# Patient Record
Sex: Male | Born: 1950 | Race: White | Hispanic: No | Smoking: Former smoker
Health system: Southern US, Community
[De-identification: ages and names within clinical notes are randomized; demographics above are authoritative.]

## PROBLEM LIST (undated history)

## (undated) DIAGNOSIS — E7800 Pure hypercholesterolemia, unspecified: Secondary | ICD-10-CM

## (undated) DIAGNOSIS — R51 Headache: Secondary | ICD-10-CM

## (undated) DIAGNOSIS — E785 Hyperlipidemia, unspecified: Secondary | ICD-10-CM

## (undated) DIAGNOSIS — J302 Other seasonal allergic rhinitis: Secondary | ICD-10-CM

## (undated) DIAGNOSIS — I4819 Other persistent atrial fibrillation: Secondary | ICD-10-CM

## (undated) HISTORY — PX: FRACTURE SURGERY: SHX138

## (undated) HISTORY — PX: COLONOSCOPY: SHX174

## (undated) HISTORY — DX: Headache: R51

## (undated) HISTORY — DX: Other persistent atrial fibrillation: I48.19

## (undated) HISTORY — DX: Hyperlipidemia, unspecified: E78.5

## (undated) HISTORY — DX: Other seasonal allergic rhinitis: J30.2

## (undated) HISTORY — PX: POLYPECTOMY: SHX149

---

## 2000-09-16 ENCOUNTER — Emergency Department (HOSPITAL_COMMUNITY): Admission: EM | Admit: 2000-09-16 | Discharge: 2000-09-16 | Payer: Self-pay | Admitting: Emergency Medicine

## 2002-05-14 ENCOUNTER — Encounter: Payer: Self-pay | Admitting: Internal Medicine

## 2003-09-22 ENCOUNTER — Encounter: Payer: Self-pay | Admitting: Internal Medicine

## 2006-03-03 ENCOUNTER — Ambulatory Visit: Payer: Self-pay | Admitting: Internal Medicine

## 2007-01-08 ENCOUNTER — Ambulatory Visit: Payer: Self-pay | Admitting: Internal Medicine

## 2007-01-12 LAB — CONVERTED CEMR LAB
ALT: 27 units/L (ref 0–53)
AST: 23 units/L (ref 0–37)
Albumin: 4.3 g/dL (ref 3.5–5.2)
Alkaline Phosphatase: 39 units/L (ref 39–117)
BUN: 15 mg/dL (ref 6–23)
Basophils Absolute: 0 10*3/uL (ref 0.0–0.1)
Basophils Relative: 0.3 % (ref 0.0–1.0)
Bilirubin, Direct: 0.1 mg/dL (ref 0.0–0.3)
CO2: 32 meq/L (ref 19–32)
Calcium: 9.6 mg/dL (ref 8.4–10.5)
Chloride: 106 meq/L (ref 96–112)
Cholesterol: 243 mg/dL (ref 0–200)
Creatinine, Ser: 1 mg/dL (ref 0.4–1.5)
Direct LDL: 167.4 mg/dL
Eosinophils Absolute: 0.2 10*3/uL (ref 0.0–0.6)
Eosinophils Relative: 3.5 % (ref 0.0–5.0)
GFR calc Af Amer: 99 mL/min
GFR calc non Af Amer: 82 mL/min
Glucose, Bld: 90 mg/dL (ref 70–99)
HCT: 43.1 % (ref 39.0–52.0)
HDL: 37.3 mg/dL — ABNORMAL LOW (ref >39.0)
Hemoglobin: 14.6 g/dL (ref 13.0–17.0)
Lymphocytes Relative: 30.5 % (ref 12.0–46.0)
MCHC: 33.9 g/dL (ref 30.0–36.0)
MCV: 85.6 fL (ref 78.0–100.0)
Monocytes Absolute: 0.5 10*3/uL (ref 0.2–0.7)
Monocytes Relative: 9.3 % (ref 3.0–11.0)
Neutro Abs: 2.8 10*3/uL (ref 1.4–7.7)
Neutrophils Relative %: 56.4 % (ref 43.0–77.0)
PSA: 0.76 ng/mL (ref 0.10–4.00)
Platelets: 239 10*3/uL (ref 150–400)
Potassium: 4.5 meq/L (ref 3.5–5.1)
RBC: 5.04 M/uL (ref 4.22–5.81)
RDW: 12.4 % (ref 11.5–14.6)
Sodium: 143 meq/L (ref 135–145)
TSH: 1.76 microintl units/mL (ref 0.35–5.50)
Total Bilirubin: 0.9 mg/dL (ref 0.3–1.2)
Total CHOL/HDL Ratio: 6.5
Total Protein: 6.5 g/dL (ref 6.0–8.3)
Triglycerides: 104 mg/dL (ref 0–149)
VLDL: 21 mg/dL (ref 0–40)
WBC: 5.1 10*3/uL (ref 4.5–10.5)

## 2007-01-23 ENCOUNTER — Ambulatory Visit: Payer: Self-pay | Admitting: Internal Medicine

## 2007-01-24 LAB — CONVERTED CEMR LAB
Bilirubin Urine: NEGATIVE
Glucose, Urine, Semiquant: NEGATIVE
Ketones, urine, test strip: NEGATIVE
Nitrite: NEGATIVE
Specific Gravity, Urine: 1.025
Urobilinogen, UA: 0.2
WBC Urine, dipstick: NEGATIVE
pH: 5.5

## 2007-05-11 ENCOUNTER — Ambulatory Visit: Payer: Self-pay | Admitting: Internal Medicine

## 2007-05-11 DIAGNOSIS — E785 Hyperlipidemia, unspecified: Secondary | ICD-10-CM

## 2007-05-14 LAB — CONVERTED CEMR LAB
Direct LDL: 144 mg/dL
HDL: 34.9 mg/dL — ABNORMAL LOW (ref >39.0)
Total CHOL/HDL Ratio: 5.8

## 2007-06-07 ENCOUNTER — Ambulatory Visit: Payer: Self-pay | Admitting: Internal Medicine

## 2007-06-07 LAB — CONVERTED CEMR LAB: Inflenza A Ag: NEGATIVE

## 2007-06-11 LAB — CONVERTED CEMR LAB
ALT: 26 units/L (ref 0–53)
Amylase: 50 units/L (ref 27–131)
Basophils Absolute: 0 10*3/uL (ref 0.0–0.1)
Bilirubin, Direct: 0.1 mg/dL (ref 0.0–0.3)
Eosinophils Absolute: 0.1 10*3/uL (ref 0.0–0.7)
HCT: 42.5 % (ref 39.0–52.0)
Hemoglobin: 14.2 g/dL (ref 13.0–17.0)
MCHC: 33.5 g/dL (ref 30.0–36.0)
MCV: 84.3 fL (ref 78.0–100.0)
Monocytes Absolute: 0.5 10*3/uL (ref 0.1–1.0)
Monocytes Relative: 7.1 % (ref 3.0–12.0)
Neutro Abs: 5.7 10*3/uL (ref 1.4–7.7)
Platelets: 217 10*3/uL (ref 150–400)
RDW: 12.1 % (ref 11.5–14.6)
Total Bilirubin: 0.7 mg/dL (ref 0.3–1.2)

## 2007-09-06 ENCOUNTER — Telehealth: Payer: Self-pay | Admitting: Internal Medicine

## 2007-11-01 ENCOUNTER — Ambulatory Visit: Payer: Self-pay | Admitting: Internal Medicine

## 2007-11-01 LAB — CONVERTED CEMR LAB
Basophils Absolute: 0 10*3/uL (ref 0.0–0.1)
Basophils Relative: 0.4 % (ref 0.0–3.0)
Bilirubin Urine: NEGATIVE
Bilirubin, Direct: 0.1 mg/dL (ref 0.0–0.3)
Blood in Urine, dipstick: NEGATIVE
Calcium: 9.2 mg/dL (ref 8.4–10.5)
Cholesterol: 203 mg/dL (ref 0–200)
Creatinine, Ser: 0.9 mg/dL (ref 0.4–1.5)
Direct LDL: 154.3 mg/dL
Eosinophils Absolute: 0.2 10*3/uL (ref 0.0–0.7)
GFR calc non Af Amer: 92 mL/min
Glucose, Urine, Semiquant: NEGATIVE
HDL: 34.3 mg/dL — ABNORMAL LOW (ref >39.0)
Hemoglobin: 14.2 g/dL (ref 13.0–17.0)
MCHC: 33.9 g/dL (ref 30.0–36.0)
MCV: 85.5 fL (ref 78.0–100.0)
Neutro Abs: 3.2 10*3/uL (ref 1.4–7.7)
Neutrophils Relative %: 61.4 % (ref 43.0–77.0)
PSA: 0.63 ng/mL (ref 0.10–4.00)
Protein, U semiquant: NEGATIVE
RBC: 4.9 M/uL (ref 4.22–5.81)
RDW: 12.6 % (ref 11.5–14.6)
Sodium: 143 meq/L (ref 135–145)
Specific Gravity, Urine: 1.025
TSH: 1 microintl units/mL (ref 0.35–5.50)
Total Bilirubin: 0.9 mg/dL (ref 0.3–1.2)
VLDL: 14 mg/dL (ref 0–40)
WBC Urine, dipstick: NEGATIVE
pH: 5.5

## 2007-11-06 ENCOUNTER — Encounter: Payer: Self-pay | Admitting: Internal Medicine

## 2007-11-06 ENCOUNTER — Ambulatory Visit: Payer: Self-pay | Admitting: Internal Medicine

## 2007-11-09 ENCOUNTER — Ambulatory Visit: Payer: Self-pay | Admitting: Internal Medicine

## 2007-11-12 LAB — CONVERTED CEMR LAB: Fecal Occult Bld: POSITIVE — AB

## 2007-11-20 ENCOUNTER — Ambulatory Visit: Payer: Self-pay | Admitting: Gastroenterology

## 2007-11-21 ENCOUNTER — Encounter: Payer: Self-pay | Admitting: Internal Medicine

## 2007-11-24 LAB — HM COLONOSCOPY

## 2007-12-04 ENCOUNTER — Ambulatory Visit: Payer: Self-pay | Admitting: Gastroenterology

## 2007-12-04 ENCOUNTER — Encounter: Payer: Self-pay | Admitting: Gastroenterology

## 2007-12-06 ENCOUNTER — Encounter: Payer: Self-pay | Admitting: Gastroenterology

## 2007-12-17 ENCOUNTER — Encounter: Payer: Self-pay | Admitting: Internal Medicine

## 2008-05-28 ENCOUNTER — Ambulatory Visit: Payer: Self-pay | Admitting: Internal Medicine

## 2008-05-28 LAB — CONVERTED CEMR LAB
Nitrite: NEGATIVE
Specific Gravity, Urine: 1.025
WBC Urine, dipstick: NEGATIVE

## 2008-05-29 ENCOUNTER — Encounter: Payer: Self-pay | Admitting: Internal Medicine

## 2008-06-04 ENCOUNTER — Ambulatory Visit: Payer: Self-pay | Admitting: Internal Medicine

## 2008-10-16 ENCOUNTER — Telehealth: Payer: Self-pay | Admitting: Internal Medicine

## 2008-10-28 ENCOUNTER — Ambulatory Visit: Payer: Self-pay | Admitting: Internal Medicine

## 2008-11-26 ENCOUNTER — Ambulatory Visit: Payer: Self-pay | Admitting: Pulmonary Disease

## 2009-01-08 ENCOUNTER — Telehealth: Payer: Self-pay | Admitting: Internal Medicine

## 2009-03-23 ENCOUNTER — Ambulatory Visit: Payer: Self-pay | Admitting: Internal Medicine

## 2009-03-23 DIAGNOSIS — R51 Headache: Secondary | ICD-10-CM

## 2009-03-23 HISTORY — DX: Headache: R51

## 2009-03-24 ENCOUNTER — Ambulatory Visit: Payer: Self-pay | Admitting: Internal Medicine

## 2009-03-24 LAB — CONVERTED CEMR LAB
ALT: 36 units/L (ref 0–53)
Albumin: 4.3 g/dL (ref 3.5–5.2)
Bilirubin, Direct: 0.1 mg/dL (ref 0.0–0.3)
CO2: 31 meq/L (ref 19–32)
Chloride: 112 meq/L (ref 96–112)
Cholesterol: 204 mg/dL — ABNORMAL HIGH (ref 0–200)
Creatinine, Ser: 0.9 mg/dL (ref 0.4–1.5)
Glucose, Bld: 85 mg/dL (ref 70–99)
TSH: 1.49 microintl units/mL (ref 0.35–5.50)
Total Protein: 6.6 g/dL (ref 6.0–8.3)
Triglycerides: 74 mg/dL (ref 0.0–149.0)

## 2009-03-28 ENCOUNTER — Encounter: Admission: RE | Admit: 2009-03-28 | Discharge: 2009-03-28 | Payer: Self-pay | Admitting: Internal Medicine

## 2009-05-14 ENCOUNTER — Inpatient Hospital Stay (HOSPITAL_COMMUNITY): Admission: EM | Admit: 2009-05-14 | Discharge: 2009-05-15 | Payer: Self-pay | Admitting: Emergency Medicine

## 2009-05-14 ENCOUNTER — Ambulatory Visit: Payer: Self-pay | Admitting: Cardiology

## 2009-05-15 ENCOUNTER — Encounter (INDEPENDENT_AMBULATORY_CARE_PROVIDER_SITE_OTHER): Payer: Self-pay | Admitting: Internal Medicine

## 2009-05-26 ENCOUNTER — Ambulatory Visit: Payer: Self-pay | Admitting: Internal Medicine

## 2009-05-26 DIAGNOSIS — I5189 Other ill-defined heart diseases: Secondary | ICD-10-CM

## 2009-05-26 DIAGNOSIS — I4891 Unspecified atrial fibrillation: Secondary | ICD-10-CM | POA: Insufficient documentation

## 2010-03-09 NOTE — Assessment & Plan Note (Signed)
Summary: HEAD PAIN, NERVOUS FEELING, NAUSEA // RS   Vital Signs:  Patient profile:   60 year old male Weight:      200 pounds BMI:     28.00 Temp:     98.5 degrees F Pulse rate:   56 / minute Resp:     16 per minute BP sitting:   124 / 80  (left arm)  Vitals Entered By: Gladis Riffle, RN (March 23, 2009 11:21 AM) CC: c/o pains at back of head with nausea, heart flutter, SOB x 3 days beginning 2/10--now feeling tired--fasting Is Patient Diabetic? No Comments on 2/7 had tooth pulled and put on amoxicillin and vicodin; continues to use at times   CC:  c/o pains at back of head with nausea, heart flutter, and SOB x 3 days beginning 2/10--now feeling tired--fasting.  History of Present Illness: 4 days ago had severe pain---worst of life associated with dizziness and heart palptiations no other specific neurolgic defeicits sxs resolved over the next 4 days (otc analgesics) no trauma no other causative factors caffeine---1-2 per day  All other systems reviewed and were negative   Preventive Screening-Counseling & Management  Alcohol-Tobacco     Smoking Status: never  Current Problems (verified): 1)  Hyperlipidemia  (ICD-272.4) 2)  Preventive Health Care  (ICD-V70.0)  Medications Prior to Update: 1)  No Medications  Current Medications (verified): 1)  No Medications  Allergies (verified): No Known Drug Allergies  Past History:  Past Medical History: Last updated: 05/11/2007 Cholelithiasis Hyperlipidemia  Past Surgical History: Last updated: 11/06/2007 Denies surgical history  Family History: Last updated: 05/11/2007 father alive and well 79 yo mother deceased breast CA--36 yo  Social History: Last updated: 01/08/2007 Occupation: Married Divorced Never Smoked Regular exercise-no  Risk Factors: Exercise: no (01/08/2007)  Risk Factors: Smoking Status: never (03/23/2009)  Review of Systems       All other systems reviewed and were negative    Physical Exam  General:  Well-developed,well-nourished,in no acute distress; alert,appropriate and cooperative throughout examination Head:  normocephalic and atraumatic.   Eyes:  pupils equal and pupils round.  no papilledema Ears:  R ear normal and L ear normal.   Neck:  supple and full ROM.   Chest Wall:  No deformities, masses, tenderness or gynecomastia noted. Lungs:  Normal respiratory effort, chest expands symmetrically. Lungs are clear to auscultation, no crackles or wheezes. Heart:  Normal rate and regular rhythm. S1 and S2 normal without gallop, murmur, click, rub or other extra sounds. Abdomen:  Bowel sounds positive,abdomen soft and non-tender without masses, organomegaly or hernias noted. Msk:  No deformity or scoliosis noted of thoracic or lumbar spine.   Pulses:  R radial normal and L radial normal.   Neurologic:  cranial nerves II-XII intact and gait normal.   Skin:  turgor normal and color normal.   Psych:  good eye contact and not anxious appearing.     Impression & Recommendations:  Problem # 1:  HYPERLIPIDEMIA (ICD-272.4)  no need for treatment  Orders: TLB-Hepatic/Liver Function Pnl (80076-HEPATIC) TLB-Lipid Panel (80061-LIPID)  Problem # 2:  HEADACHE (ICD-784.0)  concerning---worst headache ever associated with vague neurologic sxs (dizzy) check CT head without contrast  Orders: Radiology Referral (Radiology) Venipuncture (61607) TLB-Sedimentation Rate (ESR) (85652-ESR)  Problem # 3:  PALPITATIONS (ICD-785.1)  uclear etiology  Orders: TLB-TSH (Thyroid Stimulating Hormone) (84443-TSH) TLB-BMP (Basic Metabolic Panel-BMET) (80048-METABOL)  Complete Medication List: 1)  No Medications

## 2010-03-09 NOTE — Assessment & Plan Note (Signed)
Summary: follow up per Dr. Riley Kill re: afib/cjr   Vital Signs:  Patient profile:   60 year old male Weight:      198 pounds Temp:     98.5 degrees F Pulse rhythm:   56 Resp:     12 per minute BP sitting:   112 / 68  (left arm) Cuff size:   regular  Vitals Entered By: Gladis Riffle, RN (May 26, 2009 7:55 AM) CC: was hospitalized for atrial fibrillation and told to see PCP per dr Riley Kill Is Patient Diabetic? No   CC:  was hospitalized for atrial fibrillation and told to see PCP per dr Riley Kill.  History of Present Illness:  Follow-Up Visit      This is a 60 year old man who presents for Follow-up visit.  The patient complains of palpitations, but denies chest pain, dizziness, and syncope.  Since the last visit the patient notes a recent hospitilization.  The patient reports taking meds as prescribed.  When questioned about possible medication side effects, the patient notes none.    All other systems reviewed and were negative   Preventive Screening-Counseling & Management  Alcohol-Tobacco     Smoking Status: never  Current Medications (verified): 1)  Aspirin 325 Mg Tabs (Aspirin) .... Once Daily  Allergies (verified): No Known Drug Allergies  Past History:  Past Medical History: Cholelithiasis Hyperlipidemia Atrial fibrillation---dx and hospitalized 05/2009 diastolic dysfunction  Family History: father alive and well 65 yo hx of atrial fibrillation mother deceased breast CA--36 yo  Physical Exam  General:  Well-developed,well-nourished,in no acute distress; alert,appropriate and cooperative throughout examination Head:  normocephalic and atraumatic.   Eyes:  pupils equal and pupils round.   Ears:  R ear normal and L ear normal.   Nose:  no external deformity and nose piercing noted.   Neck:  supple and full ROM.   Chest Wall:  No deformities, masses, tenderness or gynecomastia noted. Lungs:  Normal respiratory effort, chest expands symmetrically. Lungs are clear  to auscultation, no crackles or wheezes. Heart:  Normal rate and regular rhythm. S1 and S2 normal without gallop, murmur, click, rub or other extra sounds. Abdomen:  Bowel sounds positive,abdomen soft and non-tender without masses, organomegaly or hernias noted. Msk:  No deformity or scoliosis noted of thoracic or lumbar spine.   Extremities:  No clubbing, cyanosis, edema, or deformity noted  Neurologic:  cranial nerves II-XII intact and gait normal.   Skin:  turgor normal and color normal.   Psych:  normally interactive and good eye contact.     Impression & Recommendations:  Problem # 1:  ATRIAL FIBRILLATION (ICD-427.31) started after acute gastroenteritis with fever. He was sick for 3 days and the next a.m. had palpitations. Eventually hospitalized with afib---reviewed hospital d.c summary.  no eval for ischemia necessary I'll give diltiazem to use as needed  His updated medication list for this problem includes:    Aspirin 325 Mg Tabs (Aspirin) ..... Once daily    Diltiazem Hcl 60 Mg Tabs (Diltiazem hcl) .[name] Kitchen... Take 1 tablet by mouth three times a day as needed for atrial fibrillation  Problem # 2:  DIASTOLIC DYSFUNCTION (ICD-429.9) this is by echo criteria only clinically not an issue  Complete Medication List: 1)  Aspirin 325 Mg Tabs (Aspirin) .... Once daily 2)  Diltiazem Hcl 60 Mg Tabs (Diltiazem hcl) .... Take 1 tablet by mouth three times a day as needed for atrial fibrillation Prescriptions: DILTIAZEM HCL 60 MG TABS (DILTIAZEM HCL) Take 1  tablet by mouth three times a day as needed for atrial fibrillation  #30 x 0   Entered and Authorized by:   Birdie Sons MD   Signed by:   Birdie Sons MD on 05/26/2009   Method used:   Electronically to        [facility] [address] [phone]* (retail)       [address], [place]  [address]       Ph: [phone]       Fax: [phone]   RxID:   [id number]

## 2010-04-28 LAB — CARDIAC PANEL(CRET KIN+CKTOT+MB+TROPI)
CK, MB: 1.6 ng/mL (ref 0.3–4.0)
Total CK: 97 U/L (ref 7–232)
Troponin I: 0.01 ng/mL (ref 0.00–0.06)
Troponin I: <0.01 ng/mL (ref 0.00–0.06)

## 2010-04-28 LAB — CBC
HCT: 40.5 % (ref 39.0–52.0)
HCT: 45.7 % (ref 39.0–52.0)
Hemoglobin: 13.8 g/dL (ref 13.0–17.0)
Hemoglobin: 15 g/dL (ref 13.0–17.0)
Platelets: 204 10*3/uL (ref 150–400)
RBC: 5.28 MIL/uL (ref 4.22–5.81)
RDW: 13 % (ref 11.5–15.5)
WBC: 6.3 10*3/uL (ref 4.0–10.5)
WBC: 7.6 10*3/uL (ref 4.0–10.5)

## 2010-04-28 LAB — LIPID PANEL
HDL: 23 mg/dL — ABNORMAL LOW (ref >39)
LDL Cholesterol: 75 mg/dL (ref 0–99)
Triglycerides: 94 mg/dL (ref <150)

## 2010-04-28 LAB — BASIC METABOLIC PANEL
CO2: 25 mEq/L (ref 19–32)
Calcium: 9.1 mg/dL (ref 8.4–10.5)
GFR calc Af Amer: >60 mL/min (ref >60)
GFR calc non Af Amer: >60 mL/min (ref >60)
Sodium: 137 mEq/L (ref 135–145)

## 2010-04-28 LAB — DIFFERENTIAL
Eosinophils Relative: 3 % (ref 0–5)
Lymphocytes Relative: 22 % (ref 12–46)
Lymphs Abs: 1.7 10*3/uL (ref 0.7–4.0)
Monocytes Absolute: 0.7 10*3/uL (ref 0.1–1.0)
Neutro Abs: 5.1 10*3/uL (ref 1.7–7.7)

## 2010-04-28 LAB — CK TOTAL AND CKMB (NOT AT ARMC)
CK, MB: 1.8 ng/mL (ref 0.3–4.0)
Total CK: 113 U/L (ref 7–232)

## 2010-04-28 LAB — TROPONIN I: Troponin I: 0.02 ng/mL (ref 0.00–0.06)

## 2010-04-28 LAB — POCT CARDIAC MARKERS
Myoglobin, poc: 76.7 ng/mL (ref 12–200)
Troponin i, poc: <0.05 ng/mL (ref 0.00–0.09)

## 2010-04-28 LAB — APTT: aPTT: 23 seconds — ABNORMAL LOW (ref 24–37)

## 2010-10-25 ENCOUNTER — Encounter: Payer: Self-pay | Admitting: Family Medicine

## 2010-10-25 ENCOUNTER — Ambulatory Visit (INDEPENDENT_AMBULATORY_CARE_PROVIDER_SITE_OTHER): Payer: PRIVATE HEALTH INSURANCE | Admitting: Family Medicine

## 2010-10-25 VITALS — BP 120/82 | Temp 98.6°F | Wt 208.0 lb

## 2010-10-25 DIAGNOSIS — L259 Unspecified contact dermatitis, unspecified cause: Secondary | ICD-10-CM

## 2010-10-25 MED ORDER — METHYLPREDNISOLONE ACETATE 80 MG/ML IJ SUSP
80.0000 mg | Freq: Once | INTRAMUSCULAR | Status: AC
  Administered 2010-10-25: 80 mg via INTRAMUSCULAR

## 2010-10-25 NOTE — Progress Notes (Signed)
  Subjective:    Patient ID: Robert David, male    DOB: 1950-10-07, 60 y.o.   MRN: [id number]  HPI Pruritic rash 1 week duration.  Blistering rash after working outdoors. Has tried several home remedies without improvement. History of similar outbreak in the past. Denies fever or chills. Past history atrial fibrillation but currently stable in sinus rhythm   Review of Systems  Constitutional: Negative for fever and chills.  Skin: Positive for rash.  Hematological: Negative for adenopathy.       Objective:   Physical Exam  Constitutional: She appears well-developed and well-nourished.  Cardiovascular: Normal rate, regular rhythm and normal heart sounds.   Pulmonary/Chest: Effort normal and breath sounds normal. No respiratory distress. She has no wheezes. She has no rales.  Musculoskeletal: She exhibits no edema.  Skin:       Patient has slightly raised rash vesicular with erythematous base-both forearms. Nontender          Assessment & Plan:  Contact dermatitis. Depo-Medrol 80 mg IM. Followup as needed

## 2010-10-28 ENCOUNTER — Telehealth: Payer: Self-pay | Admitting: *Deleted

## 2010-10-28 MED ORDER — PREDNISONE 10 MG PO TABS: ORAL_TABLET | ORAL | Status: DC

## 2010-10-28 NOTE — Telephone Encounter (Signed)
Prednisone 10 mg taper:  4-4-4-4-3-3-2-2-1-1 (#28)

## 2010-10-28 NOTE — Telephone Encounter (Signed)
Pt states that his poison oak is no better and would like a RX sent to [facility] in [place].  ([address])

## 2010-10-28 NOTE — Telephone Encounter (Signed)
Pt informed on VM

## 2011-02-24 ENCOUNTER — Encounter: Payer: Self-pay | Admitting: Family

## 2011-02-24 ENCOUNTER — Other Ambulatory Visit: Payer: Self-pay | Admitting: Family

## 2011-02-24 ENCOUNTER — Ambulatory Visit (INDEPENDENT_AMBULATORY_CARE_PROVIDER_SITE_OTHER): Payer: PRIVATE HEALTH INSURANCE | Admitting: Family

## 2011-02-24 VITALS — BP 122/82 | Temp 99.1°F | Wt 211.0 lb

## 2011-02-24 DIAGNOSIS — A088 Other specified intestinal infections: Secondary | ICD-10-CM

## 2011-02-24 DIAGNOSIS — A084 Viral intestinal infection, unspecified: Secondary | ICD-10-CM

## 2011-02-24 LAB — CBC WITH DIFFERENTIAL/PLATELET
Basophils Absolute: 0.1 10*3/uL (ref 0.0–0.1)
Eosinophils Absolute: 0.2 10*3/uL (ref 0.0–0.7)
Lymphocytes Relative: 14 % (ref 12.0–46.0)
MCHC: 33.1 g/dL (ref 30.0–36.0)
MCV: 86.3 fl (ref 78.0–100.0)
Monocytes Absolute: 1 10*3/uL (ref 0.1–1.0)
Neutrophils Relative %: 75.9 % (ref 43.0–77.0)
Platelets: 220 10*3/uL (ref 150.0–400.0)
RBC: 4.94 Mil/uL (ref 3.87–5.11)
RDW: 13.2 % (ref 11.5–14.6)

## 2011-02-24 LAB — BASIC METABOLIC PANEL
BUN: 14 mg/dL (ref 6–23)
CO2: 29 mEq/L (ref 19–32)
Calcium: 9.9 mg/dL (ref 8.4–10.5)
Chloride: 101 mEq/L (ref 96–112)
Creatinine, Ser: 1.3 mg/dL — ABNORMAL HIGH (ref 0.4–1.2)

## 2011-02-24 MED ORDER — CIPROFLOXACIN HCL 500 MG PO TABS: 500.0000 mg | ORAL_TABLET | Freq: Two times a day (BID) | ORAL | Status: AC

## 2011-02-24 MED ORDER — DICYCLOMINE HCL 20 MG PO TABS: 20.0000 mg | ORAL_TABLET | Freq: Four times a day (QID) | ORAL | Status: DC

## 2011-02-24 NOTE — Progress Notes (Signed)
Subjective:    Patient ID: Robert David, male    DOB: Oct 13, 1950, 61 y.o.   MRN: [id number]  HPI A 61 year old white male, nonsmoker, patient of Dr. Cato Mulligan in today with complaints of abdominal pain that began last night. He describes the pain as cramping. Rates it a 10 out of 10 when it comes, relieved with defecation. He also developed a fever of 102. He's been taking ibuprofen and Tylenol that has helped. Has had mild diarrhea but denies any nausea or vomiting. Denies any sick contacts.    Review of Systems  Constitutional: Positive for fever and fatigue.  Eyes: Negative.   Respiratory: Negative.   Cardiovascular: Negative.   Gastrointestinal: Positive for abdominal pain and diarrhea. Negative for nausea, vomiting and rectal pain.  Genitourinary: Negative.   Musculoskeletal: Negative.   Skin: Negative.   Neurological: Negative.   Hematological: Negative.   Psychiatric/Behavioral: Negative.    Past Medical History  Diagnosis Date  . HYPERLIPIDEMIA 05/11/2007  . Atrial fibrillation 05/26/2009  . Headache 03/23/2009  . Cholelithiasis     History   Social History  . Marital Status: Single    Spouse Name: N/A    Number of Children: N/A  . Years of Education: N/A   Occupational History  . Not on file.   Social History Main Topics  . Smoking status: Former Smoker -- 10 years    Types: Cigarettes    Quit date: 10/24/1988  . Smokeless tobacco: Not on file  . Alcohol Use: Not on file  . Drug Use: Not on file  . Sexually Active: Not on file   Other Topics Concern  . Not on file   Social History Narrative  . No narrative on file    No past surgical history on file.  Family History  Problem Relation Age of Onset  . Cancer Mother     breast  . Heart disease Father     hx A-Fib    No Known Allergies  Current Outpatient Prescriptions on File Prior to Visit  Medication Sig Dispense Refill  . predniSONE (DELTASONE) 10 MG tablet Taper with 4 tabs for 4 days, 3 tabs  for 2 days, 2 tabs for 2 days, 1 tabs for 2 days  28 tablet  0    BP 122/82  Temp(Src) 99.1 F (37.3 C) (Oral)  Wt 211 lb (95.709 kg)chart    Objective:   Physical Exam  Constitutional: She is oriented to person, place, and time.  HENT:  Right Ear: External ear normal.  Left Ear: External ear normal.  Nose: Nose normal.  Mouth/Throat: Oropharynx is clear and moist.  Neck: Normal range of motion. Neck supple.  Cardiovascular: Normal rate, regular rhythm and normal heart sounds.   Pulmonary/Chest: Effort normal and breath sounds normal.  Abdominal: Soft. Bowel sounds are normal. There is no tenderness. There is no rebound and no guarding.  Musculoskeletal: Normal range of motion.  Neurological: She is alert and oriented to person, place, and time.  Skin: Skin is warm and dry.  Psychiatric: She has a normal mood and affect.          Assessment & Plan:  Assessment: Viral gastroenteritis, diarrhea  Plan: Bentyl 20 mg every 6-8 hours when necessary to help with abdominal pain. It lasted to include CMP and CBC the patient pending results and discuss further treatment plan if necessary at that time. Rest. Drink clear fluids. Patient call the office if symptoms worsen or persist here to recheck test  schedule and when necessary.

## 2011-02-24 NOTE — Patient Instructions (Signed)
Viral Gastroenteritis Viral gastroenteritis is also known as stomach flu. This condition affects the stomach and intestinal tract. The illness typically lasts 3 to 8 days. Most people develop an immune response. This eventually gets rid of the virus. While this natural response develops, the virus can make you quite ill.  CAUSES  Diarrhea and vomiting are often caused by a virus. Medicines (antibiotics) that kill germs will not help unless there is also a germ (bacterial) infection. SYMPTOMS  The most common symptom is diarrhea. This can cause severe loss of fluids (dehydration) and body salt (electrolyte) imbalance. TREATMENT  Treatments for this illness are aimed at rehydration. Antidiarrheal medicines are not recommended. They do not decrease diarrhea volume and may be harmful. Usually, home treatment is all that is needed. The most serious cases involve vomiting so severely that you are not able to keep down fluids taken by mouth (orally). In these cases, intravenous (IV) fluids are needed. Vomiting with viral gastroenteritis is common, but it will usually go away with treatment. HOME CARE INSTRUCTIONS  Small amounts of fluids should be taken frequently. Large amounts at one time may not be tolerated. Plain water may be harmful in infants and the elderly. Oral rehydration solutions (ORS) are available at pharmacies and grocery stores. ORS replace water and important electrolytes in proper proportions. Sports drinks are not as effective as ORS and may be harmful due to sugars worsening diarrhea.  As a general guideline for children, replace any new fluid losses from diarrhea or vomiting with ORS as follows:   If your child weighs 22 pounds or under (10 kg or less), give 60-120 mL (1/4 - 1/2 cup or 2 - 4 ounces) of ORS for each diarrheal stool or vomiting episode.   If your child weighs more than 22 pounds (more than 10 kgs), give 120-240 mL (1/2 - 1 cup or 4 - 8 ounces) of ORS for each diarrheal  stool or vomiting episode.   In a child with vomiting, it may be helpful to give the above ORS replacement in 5 mL (1 teaspoon) amounts every 5 minutes, then increase as tolerated.   While correcting for dehydration, children should eat normally. However, foods high in sugar should be avoided because this may worsen diarrhea. Large amounts of carbonated soft drinks, juice, gelatin desserts, and other highly sugared drinks should be avoided.   After correction of dehydration, other liquids that are appealing to the child may be added. Children should drink small amounts of fluids frequently and fluids should be increased as tolerated.   Adults should eat normally while drinking more fluids than usual. Drink small amounts of fluids frequently and increase as tolerated. Drink enough water and fluids to keep your urine clear or pale yellow. Broths, weak decaffeinated tea, lemon-lime soft drinks (allowed to go flat), and ORS replace fluids and electrolytes.   Avoid:   Carbonated drinks.   Juice.   Extremely hot or cold fluids.   Caffeine drinks.   Fatty, greasy foods.   Alcohol.   Tobacco.   Too much intake of anything at one time.   Gelatin desserts.   Probiotics are active cultures of beneficial bacteria. They may lessen the amount and number of diarrheal stools in adults. Probiotics can be found in yogurt with active cultures and in supplements.   Wash your hands well to avoid spreading bacteria and viruses.   Antidiarrheal medicines are not recommended for infants and children.   Only take over-the-counter or prescription medicines for   pain, discomfort, or fever as directed by your caregiver. Do not give aspirin to children.   For adults with dehydration, ask your caregiver if you should continue all prescribed and over-the-counter medicines.   If your caregiver has given you a follow-up appointment, it is very important to keep that appointment. Not keeping the appointment  could result in a lasting (chronic) or permanent injury and disability. If there is any problem keeping the appointment, you must call to reschedule.  SEEK IMMEDIATE MEDICAL CARE IF:   You are unable to keep fluids down.   There is no urine output in 6 to 8 hours or there is only a small amount of very dark urine.   You develop shortness of breath.   There is blood in the vomit (may look like coffee grounds) or stool.   Belly (abdominal) pain develops, increases, or localizes.   There is persistent vomiting or diarrhea.   You have a fever.   Your baby is older than 3 months with a rectal temperature of 102 F (38.9 C) or higher.   Your baby is 3 months old or younger with a rectal temperature of 100.4 F (38 C) or higher.  MAKE SURE YOU:   Understand these instructions.   Will watch your condition.   Will get help right away if you are not doing well or get worse.  Document Released: 01/24/2005 Document Revised: 10/06/2010 Document Reviewed: 06/07/2006 [employer] Patient Information 2012 [employer]. 

## 2011-03-13 IMAGING — CR DG CHEST 2V
2 series · 2 of 2 positions shown · non-contrast
Comparison: 11/06/2007

CLINICAL DATA: Chest pain.  History of atrial fibrillation.  Ex-
smoker.

CHEST - 2 VIEW

[w chest pa]
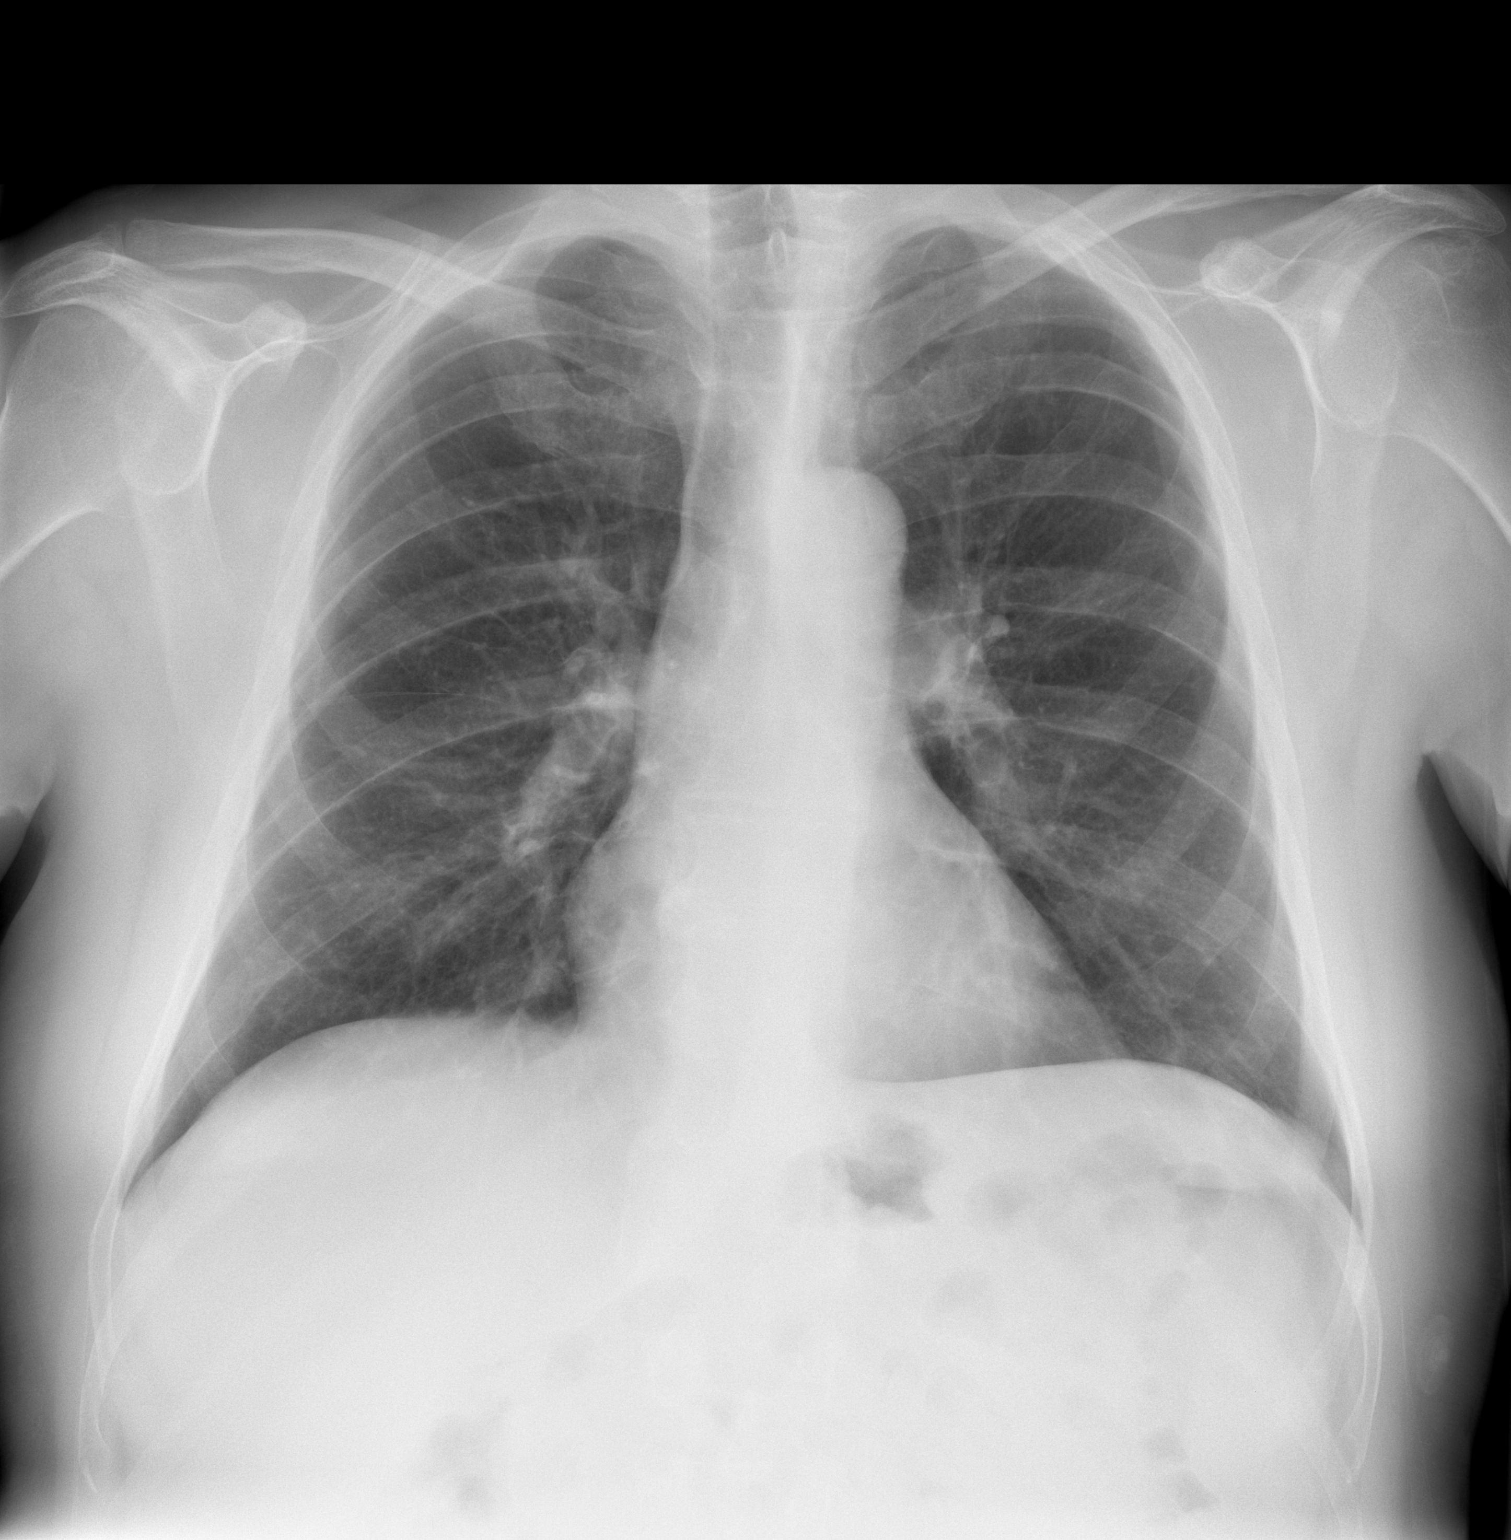

[w chest lat]
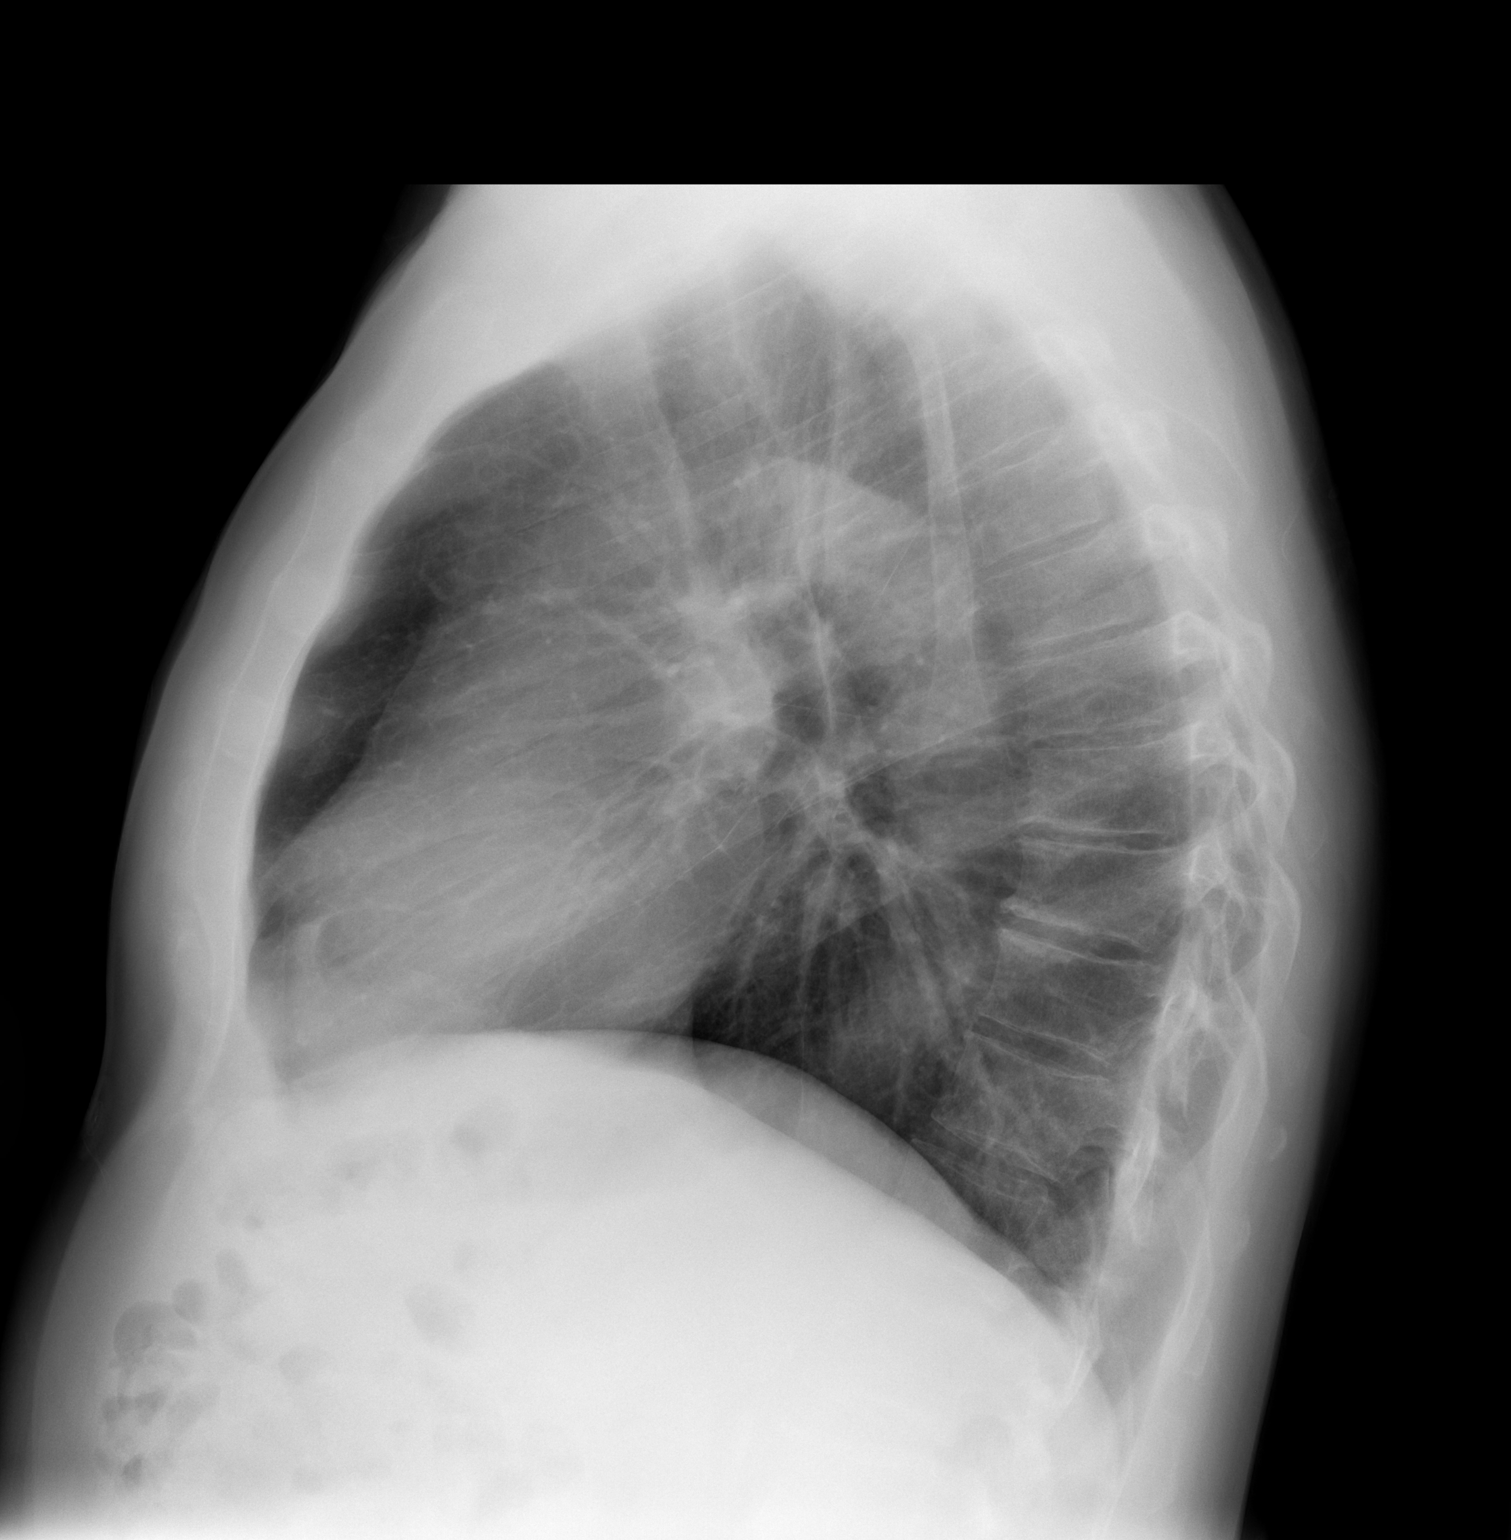

[2 of 2 positions shown; findings below may reference images not displayed]

FINDINGS: The cardiac and mediastinal silhouette appear
unremarkable.  The lung fields are clear.  There is no significant
bony abnormality.  There is no change from priors.
IMPRESSION: No active cardiopulmonary disease.

## 2011-10-03 ENCOUNTER — Other Ambulatory Visit (INDEPENDENT_AMBULATORY_CARE_PROVIDER_SITE_OTHER): Payer: PRIVATE HEALTH INSURANCE

## 2011-10-03 DIAGNOSIS — Z0000 Encounter for general adult medical examination without abnormal findings: Secondary | ICD-10-CM

## 2011-10-03 LAB — CBC WITH DIFFERENTIAL/PLATELET
Basophils Relative: 0.4 % (ref 0.0–3.0)
Eosinophils Relative: 3.3 % (ref 0.0–5.0)
HCT: 44.7 % (ref 36.0–46.0)
Lymphs Abs: 1.6 10*3/uL (ref 0.7–4.0)
MCV: 86.9 fl (ref 78.0–100.0)
Monocytes Absolute: 0.4 10*3/uL (ref 0.1–1.0)
Monocytes Relative: 7.7 % (ref 3.0–12.0)
Platelets: 207 10*3/uL (ref 150.0–400.0)
RBC: 5.14 Mil/uL — ABNORMAL HIGH (ref 3.87–5.11)
WBC: 5.4 10*3/uL (ref 4.5–10.5)

## 2011-10-03 LAB — HEPATIC FUNCTION PANEL
ALT: 26 U/L (ref 0–35)
AST: 23 U/L (ref 0–37)
Bilirubin, Direct: 0.1 mg/dL (ref 0.0–0.3)
Total Bilirubin: 0.8 mg/dL (ref 0.3–1.2)
Total Protein: 6.6 g/dL (ref 6.0–8.3)

## 2011-10-03 LAB — TSH: TSH: 1.44 u[IU]/mL (ref 0.35–5.50)

## 2011-10-03 LAB — POCT URINALYSIS DIPSTICK
Leukocytes, UA: NEGATIVE
Nitrite, UA: NEGATIVE
Protein, UA: NEGATIVE
pH, UA: 5.5

## 2011-10-03 LAB — BASIC METABOLIC PANEL
BUN: 16 mg/dL (ref 6–23)
Chloride: 105 mEq/L (ref 96–112)
GFR: 60.96 mL/min (ref >60.00)
Potassium: 5.1 mEq/L (ref 3.5–5.1)
Sodium: 142 mEq/L (ref 135–145)

## 2011-10-03 LAB — LIPID PANEL
HDL: 41 mg/dL (ref >39.00)
Total CHOL/HDL Ratio: 5
VLDL: 19.6 mg/dL (ref 0.0–40.0)

## 2011-10-04 ENCOUNTER — Encounter: Payer: Self-pay | Admitting: Internal Medicine

## 2011-10-04 ENCOUNTER — Ambulatory Visit (INDEPENDENT_AMBULATORY_CARE_PROVIDER_SITE_OTHER): Payer: PRIVATE HEALTH INSURANCE | Admitting: Internal Medicine

## 2011-10-04 VITALS — BP 112/68 | Temp 98.5°F | Wt 195.0 lb

## 2011-10-04 DIAGNOSIS — L259 Unspecified contact dermatitis, unspecified cause: Secondary | ICD-10-CM | POA: Insufficient documentation

## 2011-10-04 MED ORDER — CLOBETASOL PROPIONATE 0.05 % EX CREA: TOPICAL_CREAM | Freq: Two times a day (BID) | CUTANEOUS | Status: DC

## 2011-10-04 NOTE — Assessment & Plan Note (Signed)
61 year old white male with signs and symptoms of contact dermatitis from plant exposure. Use clobetasol cream twice a day for one to 2 weeks. If symptoms do not improve, patient will call office for prednisone taper.

## 2011-10-04 NOTE — Progress Notes (Signed)
  Subjective:    Patient ID: Robert David, male    DOB: 13-Jul-1950, 61 y.o.   MRN: [id number]  HPI  61 year old male complains of new onset rash that started 2 weeks ago. Patient reports working in his backyard and used [employer], physiological on vines on his back fence.  Plant debris came in contact with skin on his forearms.  He has red bumps on bilateral forearms that are itchy. Slight worsening over last 1 week.    Review of Systems Negative for fever chills  Past Medical History  Diagnosis Date  . HYPERLIPIDEMIA 05/11/2007  . Atrial fibrillation 05/26/2009  . Headache 03/23/2009  . Cholelithiasis     History   Social History  . Marital Status: Single    Spouse Name: N/A    Number of Children: N/A  . Years of Education: N/A   Occupational History  . Not on file.   Social History Main Topics  . Smoking status: Former Smoker -- 10 years    Types: Cigarettes    Quit date: 10/24/1988  . Smokeless tobacco: Not on file  . Alcohol Use: Not on file  . Drug Use: Not on file  . Sexually Active: Not on file   Other Topics Concern  . Not on file   Social History Narrative  . No narrative on file    No past surgical history on file.  Family History  Problem Relation Age of Onset  . Cancer Mother     breast  . Heart disease Father     hx A-Fib    No Known Allergies  Current Outpatient Prescriptions on File Prior to Visit  Medication Sig Dispense Refill  . dicyclomine (BENTYL) 20 MG tablet Take 1 tablet (20 mg total) by mouth every 6 (six) hours.  30 tablet  0    BP 112/68  Temp 98.5 F (36.9 C) (Oral)  Wt 195 lb (88.451 kg)       Objective:   Physical Exam  Constitutional: He appears well-developed and well-nourished.  Cardiovascular: Normal rate, regular rhythm and normal heart sounds.   Pulmonary/Chest: Effort normal and breath sounds normal. He has no wheezes.  Skin:       Scattered maculopapular rash in her bilateral forearms.          Assessment & Plan:

## 2011-10-04 NOTE — Patient Instructions (Addendum)
Please call our office if your symptoms do not improve or gets worse. Use zyrtec 10 once daily and ranitidine 150 mg twice daily as needed for itching.

## 2011-10-11 ENCOUNTER — Encounter: Payer: Self-pay | Admitting: Internal Medicine

## 2011-10-11 ENCOUNTER — Ambulatory Visit (INDEPENDENT_AMBULATORY_CARE_PROVIDER_SITE_OTHER): Payer: PRIVATE HEALTH INSURANCE | Admitting: Internal Medicine

## 2011-10-11 VITALS — BP 112/76 | HR 64 | Temp 98.0°F | Ht 71.0 in | Wt 194.0 lb

## 2011-10-11 DIAGNOSIS — Z2911 Encounter for prophylactic immunotherapy for respiratory syncytial virus (RSV): Secondary | ICD-10-CM

## 2011-10-11 DIAGNOSIS — I4891 Unspecified atrial fibrillation: Secondary | ICD-10-CM

## 2011-10-11 DIAGNOSIS — Z0000 Encounter for general adult medical examination without abnormal findings: Secondary | ICD-10-CM

## 2011-10-11 MED ORDER — DILTIAZEM HCL 60 MG PO TABS
60.0000 mg | ORAL_TABLET | Freq: Three times a day (TID) | ORAL | Status: DC | PRN
Start: 2011-10-11 — End: 2012-07-18

## 2011-10-11 NOTE — Progress Notes (Signed)
Patient ID: Zenovia Jordan, male   DOB: [birth year], 61 y.o.   MRN: [id number]  cpx  Past Medical History  Diagnosis Date  . HYPERLIPIDEMIA 05/11/2007  . Atrial fibrillation 05/26/2009  . Headache 03/23/2009  . Cholelithiasis     History   Social History  . Marital Status: Single    Spouse Name: N/A    Number of Children: N/A  . Years of Education: N/A   Occupational History  . Not on file.   Social History Main Topics  . Smoking status: Former Smoker -- 10 years    Types: Cigarettes    Quit date: 10/24/1988  . Smokeless tobacco: Not on file  . Alcohol Use: Not on file  . Drug Use: Not on file  . Sexually Active: Not on file   Other Topics Concern  . Not on file   Social History Narrative  . No narrative on file    No past surgical history on file.  Family History  Problem Relation Age of Onset  . Cancer Mother     breast  . Heart disease Father     hx A-Fib    No Known Allergies  No current outpatient prescriptions on file prior to visit.     patient denies chest pain, shortness of breath, orthopnea. Denies lower extremity edema, abdominal pain, change in appetite, change in bowel movements. Patient denies rashes, musculoskeletal complaints. No other specific complaints in a complete review of systems.   BP 112/76  Pulse 64  Temp 98 F (36.7 C) (Oral)  Ht 5\' 11"  (1.803 m)  Wt 194 lb (87.998 kg)  BMI 27.06 kg/m2 Well-developed male in no acute distress. HEENT exam atraumatic, normocephalic, extraocular muscles are intact. Conjunctivae are pink without exudate. Neck is supple without lymphadenopathy, thyromegaly, jugular venous distention. Chest is clear to auscultation without increased work of breathing. Cardiac exam S1-S2 are regular. The PMI is normal. No significant murmurs or gallops. Abdominal exam active bowel sounds, soft, nontender. No abdominal bruits. Extremities no clubbing cyanosis or edema. Peripheral pulses are normal without bruits. Neurologic  exam alert and oriented without any motor or sensory deficits.   Well Visit: health maint UTD

## 2011-10-20 ENCOUNTER — Ambulatory Visit (INDEPENDENT_AMBULATORY_CARE_PROVIDER_SITE_OTHER): Payer: PRIVATE HEALTH INSURANCE

## 2011-10-20 DIAGNOSIS — Z23 Encounter for immunization: Secondary | ICD-10-CM

## 2012-07-18 ENCOUNTER — Encounter (HOSPITAL_COMMUNITY): Payer: Self-pay | Admitting: Physical Medicine and Rehabilitation

## 2012-07-18 ENCOUNTER — Ambulatory Visit (INDEPENDENT_AMBULATORY_CARE_PROVIDER_SITE_OTHER): Payer: PRIVATE HEALTH INSURANCE | Admitting: Family

## 2012-07-18 ENCOUNTER — Observation Stay (HOSPITAL_COMMUNITY)
Admission: EM | Admit: 2012-07-18 | Discharge: 2012-07-19 | Disposition: A | Discharge status: Home / Self Care | Payer: PRIVATE HEALTH INSURANCE | Attending: Family Medicine | Admitting: Family Medicine

## 2012-07-18 ENCOUNTER — Encounter: Payer: Self-pay | Admitting: Family

## 2012-07-18 ENCOUNTER — Emergency Department (HOSPITAL_COMMUNITY): Payer: PRIVATE HEALTH INSURANCE

## 2012-07-18 VITALS — BP 130/80 | HR 61 | Wt 207.0 lb

## 2012-07-18 DIAGNOSIS — K219 Gastro-esophageal reflux disease without esophagitis: Secondary | ICD-10-CM

## 2012-07-18 DIAGNOSIS — R0602 Shortness of breath: Secondary | ICD-10-CM | POA: Insufficient documentation

## 2012-07-18 DIAGNOSIS — R079 Chest pain, unspecified: Principal | ICD-10-CM

## 2012-07-18 DIAGNOSIS — I4891 Unspecified atrial fibrillation: Secondary | ICD-10-CM

## 2012-07-18 DIAGNOSIS — K449 Diaphragmatic hernia without obstruction or gangrene: Secondary | ICD-10-CM | POA: Insufficient documentation

## 2012-07-18 DIAGNOSIS — E785 Hyperlipidemia, unspecified: Secondary | ICD-10-CM

## 2012-07-18 LAB — CBC WITH DIFFERENTIAL/PLATELET
Basophils Absolute: 0 10*3/uL (ref 0.0–0.1)
Basophils Relative: 0 % (ref 0–1)
MCHC: 34.7 g/dL (ref 30.0–36.0)
Monocytes Absolute: 0.6 10*3/uL (ref 0.1–1.0)
Neutro Abs: 3.7 10*3/uL (ref 1.7–7.7)
Neutrophils Relative %: 63 % (ref 43–77)
Platelets: 281 10*3/uL (ref 150–400)
RDW: 13.6 % (ref 11.5–15.5)
WBC: 5.8 10*3/uL (ref 4.0–10.5)

## 2012-07-18 LAB — BASIC METABOLIC PANEL
CO2: 26 mEq/L (ref 19–32)
Chloride: 105 mEq/L (ref 96–112)
Glucose, Bld: 91 mg/dL (ref 70–99)
Potassium: 3.8 mEq/L (ref 3.5–5.1)
Sodium: 141 mEq/L (ref 135–145)

## 2012-07-18 LAB — POCT I-STAT TROPONIN I: Troponin i, poc: 0 ng/mL (ref 0.00–0.08)

## 2012-07-18 LAB — TROPONIN I: Troponin I: <0.3 ng/mL (ref <0.30)

## 2012-07-18 MED ORDER — ASPIRIN 81 MG PO CHEW
324.0000 mg | CHEWABLE_TABLET | ORAL | Status: AC
  Administered 2012-07-19: 324 mg via ORAL
  Filled 2012-07-18: qty 4

## 2012-07-18 MED ORDER — MORPHINE SULFATE 2 MG/ML IJ SOLN: 1.0000 mg | INTRAMUSCULAR | Status: DC | PRN

## 2012-07-18 MED ORDER — ENOXAPARIN SODIUM 100 MG/ML ~~LOC~~ SOLN
100.0000 mg | Freq: Two times a day (BID) | SUBCUTANEOUS | Status: DC
  Administered 2012-07-18: 100 mg via SUBCUTANEOUS
  Filled 2012-07-18 (×4): qty 1

## 2012-07-18 MED ORDER — ASPIRIN 81 MG PO CHEW
324.0000 mg | CHEWABLE_TABLET | Freq: Once | ORAL | Status: AC
  Administered 2012-07-18: 324 mg via ORAL
  Filled 2012-07-18: qty 4

## 2012-07-18 MED ORDER — ASPIRIN EC 81 MG PO TBEC: 81.0000 mg | DELAYED_RELEASE_TABLET | Freq: Every day | ORAL | Status: DC

## 2012-07-18 MED ORDER — HEPARIN SODIUM (PORCINE) 5000 UNIT/ML IJ SOLN: 5000.0000 [IU] | Freq: Three times a day (TID) | INTRAMUSCULAR | Status: DC

## 2012-07-18 MED ORDER — SODIUM CHLORIDE 0.9 % IV SOLN
INTRAVENOUS | Status: DC
  Administered 2012-07-18: 1000 mL via INTRAVENOUS

## 2012-07-18 MED ORDER — ONDANSETRON HCL 4 MG/2ML IJ SOLN: 4.0000 mg | Freq: Four times a day (QID) | INTRAMUSCULAR | Status: DC | PRN

## 2012-07-18 MED ORDER — ONDANSETRON HCL 4 MG PO TABS: 4.0000 mg | ORAL_TABLET | Freq: Four times a day (QID) | ORAL | Status: DC | PRN

## 2012-07-18 MED ORDER — SODIUM CHLORIDE 0.9 % IJ SOLN: 3.0000 mL | Freq: Two times a day (BID) | INTRAMUSCULAR | Status: DC

## 2012-07-18 MED ORDER — NITROGLYCERIN 0.4 MG SL SUBL: 0.4000 mg | SUBLINGUAL_TABLET | SUBLINGUAL | Status: DC | PRN

## 2012-07-18 MED ORDER — ASPIRIN 81 MG PO TBEC: 81.0000 mg | DELAYED_RELEASE_TABLET | Freq: Every day | ORAL | Status: DC

## 2012-07-18 MED ORDER — ACETAMINOPHEN 650 MG RE SUPP: 650.0000 mg | Freq: Four times a day (QID) | RECTAL | Status: DC | PRN

## 2012-07-18 MED ORDER — ALUM & MAG HYDROXIDE-SIMETH 200-200-20 MG/5ML PO SUSP: 30.0000 mL | Freq: Four times a day (QID) | ORAL | Status: DC | PRN

## 2012-07-18 MED ORDER — ACETAMINOPHEN 325 MG PO TABS: 650.0000 mg | ORAL_TABLET | Freq: Four times a day (QID) | ORAL | Status: DC | PRN

## 2012-07-18 MED ORDER — SODIUM CHLORIDE 0.9 % IV SOLN
INTRAVENOUS | Status: DC
  Administered 2012-07-19: 04:00:00 via INTRAVENOUS

## 2012-07-18 MED ORDER — OXYCODONE HCL 5 MG PO TABS: 5.0000 mg | ORAL_TABLET | ORAL | Status: DC | PRN

## 2012-07-18 MED ORDER — SODIUM CHLORIDE 0.9 % IV SOLN: 250.0000 mL | INTRAVENOUS | Status: DC | PRN

## 2012-07-18 MED ORDER — SODIUM CHLORIDE 0.9 % IJ SOLN: 3.0000 mL | INTRAMUSCULAR | Status: DC | PRN

## 2012-07-18 NOTE — Consult Note (Signed)
ELECTROPHYSIOLOGY CONSULT NOTE  Patient ID: Robert David, MRN: [id number], DOB/AGE: 64-Aug-1952 64 y.o. Admit date: 07/18/2012 Date of Consult: 07/18/2012  Primary Physician: Judie Petit, MD Primary Cardiologist: new  Chief Complaint:  Chest pain    HPI Robert David is a 62 y.o. male  Admitted because of chest pain.  He is known atrial fibrillation with an echo 2011 demonstrating normal left ventricular function mild-moderate LAE (41) with some diastolic dysfunction. He also has a history of hiatal hernia  He has had progressive episodes of chest discomfort over the last 5 days. This was initially described as a relatively discrete discomfort over the left breast area. Over the last 3 days however it has been more diffuse midsternal described as a pressure with radiation into the back of the neck and into the left arm. It has not been particularly associated with exertion but seems to come on more with exertion than not. These episodes have lasted 15-30 minutes.   He has noted also some shortness of breath over the last year which is also been somewhat worse over the last couple of days. There has been no associated brackish taste; this discomfort is quite distinct from his hiatal hernia discomfort for which he takes Zantac. He also takes when necessary aspirin..  Cardiac risk factors include age, aspirin use,n LDL was 138. He does not smoke does not have diabetes.  His exercise tolerance. He had no edema.  His most recent episode of tachypalpitations was about one year ago.    Past Medical History  Diagnosis Date  . HYPERLIPIDEMIA 05/11/2007  . Atrial fibrillation 05/26/2009  . Headache(784.0) 03/23/2009  . Cholelithiasis       Surgical History: No past surgical history on file.   Home Meds: Prior to Admission medications   Medication Sig Start Date End Date Taking? Authorizing Provider  aspirin 81 MG EC tablet Take 1 tablet (81 mg total) by mouth daily. Swallow whole.  07/18/12  Yes Baker Pierini, FNP    Inpatient Medications:  . aspirin  324 mg Oral Once     Allergies: No Known Allergies  History   Social History  . Marital Status: Single    Spouse Name: N/A    Number of Children: N/A  . Years of Education: N/A   Occupational History  . Not on file.   Social History Main Topics  . Smoking status: Former Smoker -- 10 years    Types: Cigarettes    Quit date: 10/24/1988  . Smokeless tobacco: Not on file  . Alcohol Use: No  . Drug Use: Not on file  . Sexually Active: Not on file   Other Topics Concern  . Not on file   Social History Narrative  . No narrative on file     Family History  Problem Relation Age of Onset  . Cancer Mother     breast  . Heart disease Father     hx A-Fib  . Cancer Father 25    metastatic to lung     ROS:  Please see the history of present illness.   Negative except, which is being followed. All other systems reviewed and negative.    Physical Exam:   Blood pressure 126/82, pulse 58, temperature 98.8 F (37.1 C), temperature source Oral, resp. rate 19, SpO2 97.00%. General: Well developed, well nourished male in no acute distress. Head: Normocephalic, atraumatic, sclera non-icteric, no xanthomas, nares are without discharge. EENT: normal Lymph Nodes:  none Back: without  scoliosis/kyphosis , no CVA tendersness Neck: Negative for carotid bruits. JVD not elevated. Lungs: Clear bilaterally to auscultation without wheezes, rales, or rhonchi. Breathing is unlabored. Heart: RRR with S1 S2. 2/6 murmur at the right upper sternal border and S4 were appreciated. Abdomen: Soft, non-tender, non-distended with normoactive bowel sounds. No hepatomegaly. No rebound/guarding. No obvious abdominal masses. Msk:  Strength and tone appear normal for age. Extremities: No clubbing or cyanosis. No edema.  Distal pedal pulses are 2+ and equal bilaterally. Skin: Warm and Dry Neuro: Alert and oriented X 3. CN III-XII  intact Grossly normal sensory and motor function . Psych:  Responds to questions appropriately with a normal affect.      Labs: Cardiac Enzymes No results found for this basename: CKTOTAL, CKMB, TROPONINI,  in the last 72 hours CBC Lab Results  Component Value Date   WBC 5.8 07/18/2012   HGB 13.1 07/18/2012   HCT 37.8* 07/18/2012   MCV 83.1 07/18/2012   PLT 281 07/18/2012   PROTIME: No results found for this basename: LABPROT, INR,  in the last 72 hours Chemistry  Recent Labs Lab 07/18/12 1432  NA 141  K 3.8  CL 105  CO2 26  BUN 16  CREATININE 1.02  CALCIUM 9.4  GLUCOSE 91   Lipids Lab Results  Component Value Date   CHOL 199 10/03/2011   HDL 41.00 10/03/2011   LDLCALC 138* 10/03/2011   TRIG 98.0 10/03/2011   BNP No results found for this basename: probnp   Miscellaneous No results found for this basename: DDIMER    Radiology/Studies:  Dg Chest 2 View  07/18/2012   *RADIOLOGY REPORT*  Clinical Data: Chest pain and shortness of breath  CHEST - 2 VIEW  Comparison: None.  Findings: Normal cardiac silhouette and mediastinal contours.  No focal airspace opacities.  No pleural effusion or pneumothorax.  No definite evidence of edema.  No acute osseous abnormalities.  IMPRESSION: No acute cardiopulmonary disease.   Original Report Authenticated By: Tacey Ruiz, MD    EKG:  Sinus with out STT changes  Prominent T waves but also noted 2013   Assessment and Plan:     Principal Problem:   Chest pain Active Problems:   HYPERLIPIDEMIA   Atrial fibrillation   GERD (gastroesophageal reflux disease)  The patient has concerning chest pain with largely typical features in a high risk demographic but a paucity of risk factors.  He has no structural heart disease based on his echo from 2 years ago with unexplained diastolic dysfunction.  The review diagnostic strategies including stress testing coronary CT angiography versus catheterization. Given the significance of his  symptoms with a rapid progression, relatively long duration and worrisome radiation have elected to pursue diagnosis with catheterization. We reviewed the potential risks including a 1:1000 or so risk of death heart attack and or stroke  Currently on aspirin and subcutaneous heparin.    Sherryl Manges

## 2012-07-18 NOTE — Progress Notes (Signed)
Unit CM UR Completed by [facility] ED CM  W. Draxton Luu RN  

## 2012-07-18 NOTE — Progress Notes (Signed)
ANTICOAGULATION CONSULT NOTE - Initial Consult  Pharmacy Consult for Lovenox Indication: USAP  No Known Allergies  Patient Measurements: Height: 5\' 10"  (177.8 cm) Weight: 207 lb 9.6 oz (94.167 kg) IBW/kg (Calculated) : 73   Vital Signs: Temp: 98.8 F (37.1 C) (06/11 1958) Temp src: Oral (06/11 1958) BP: 156/82 mmHg (06/11 1958) Pulse Rate: 56 (06/11 1958)  Labs:  Recent Labs  07/18/12 1432  HGB 13.1  HCT 37.8*  PLT 281  CREATININE 1.02    Estimated Creatinine Clearance: 85.5 ml/min (by C-G formula based on Cr of 1.02).   Medical History: Past Medical History  Diagnosis Date  . HYPERLIPIDEMIA 05/11/2007  . Atrial fibrillation 05/26/2009  . Headache(784.0) 03/23/2009  . Cholelithiasis     Medications:  Prescriptions prior to admission  Medication Sig Dispense Refill  . aspirin 81 MG EC tablet Take 1 tablet (81 mg total) by mouth daily. Swallow whole.  30 tablet  12    Assessment: 62 y/o male patient admitted with chest pain requiring anticoagulation for r/o MI. EKG wnl, Troponins negative x 1.   Goal of Therapy:  Anti-Xa level 0.6-1.2 units/ml 4hrs after LMWH dose given Monitor platelets by anticoagulation protocol: Yes   Plan:  Lovenox 100mg  sq q12h, cbc q72h and continue to monitor.  Verlene Mayer, PharmD, BCPS Pager [phone] 07/18/2012,8:05 PM

## 2012-07-18 NOTE — Progress Notes (Signed)
Subjective:    Patient ID: Robert David, male    DOB: 02-02-1951, 62 y.o.   MRN: [id number]  HPI 62 year old white male, nonsmoker, patient of Dr. Cato Mulligan is in today with complaints of intermittent and chest discomfort that's been occurring off and on x1 week. He describes the chest pain as an 8 rates it a 5-6/10 lasting several minutes and subside he. The pain also radiates between the shoulder blades. The pain is no worse with exertion. However, reports increased shortness of breath with mowing his yard. Denies any shortness of breath, nausea, vomiting, diaphoresis, heartburn or indigestion.he had a normal echocardiogram in 2011. Reports having a normal stress test approximately 4 years ago. He has previously seen Dr. Tedra Senegal for atrial fibrillation that is intermittent. Has not had any issues with atrial fibrillation and about 9 months.  Family history of cardiovascular disease in his father. Deceased in his 59s.   Review of Systems  Constitutional: Negative.  Negative for diaphoresis.  HENT: Negative.   Eyes: Negative.   Respiratory: Positive for shortness of breath.   Cardiovascular: Positive for chest pain. Negative for palpitations and leg swelling.  Gastrointestinal: Negative.  Negative for nausea, diarrhea and constipation.  Endocrine: Negative.   Genitourinary: Negative.   Musculoskeletal: Negative.   Skin: Negative.   Allergic/Immunologic: Negative.   Neurological: Negative.   Hematological: Negative.   Psychiatric/Behavioral: Negative.    Past Medical History  Diagnosis Date  . HYPERLIPIDEMIA 05/11/2007  . Atrial fibrillation 05/26/2009  . Headache(784.0) 03/23/2009  . Cholelithiasis     History   Social History  . Marital Status: Single    Spouse Name: N/A    Number of Children: N/A  . Years of Education: N/A   Occupational History  . Not on file.   Social History Main Topics  . Smoking status: Former Smoker -- 10 years    Types: Cigarettes    Quit date:  10/24/1988  . Smokeless tobacco: Not on file  . Alcohol Use: Not on file  . Drug Use: Not on file  . Sexually Active: Not on file   Other Topics Concern  . Not on file   Social History Narrative  . No narrative on file    History reviewed. No pertinent past surgical history.  Family History  Problem Relation Age of Onset  . Cancer Mother     breast  . Heart disease Father     hx A-Fib  . Cancer Father 21    metastatic to lung    Allergies  Allergen Reactions  . Penicillins     Current Outpatient Prescriptions on File Prior to Visit  Medication Sig Dispense Refill  . diltiazem (CARDIZEM) 60 MG tablet Take 1 tablet (60 mg total) by mouth 3 (three) times daily as needed (atrial fibrillation).  60 tablet  3   No current facility-administered medications on file prior to visit.    BP 130/80  Pulse 61  Wt 207 lb (93.895 kg)  BMI 28.88 kg/m2  SpO2 97%chart    Objective:   Physical Exam  Constitutional: He is oriented to person, place, and time. He appears well-developed and well-nourished.  HENT:  Right Ear: External ear normal.  Left Ear: External ear normal.  Nose: Nose normal.  Mouth/Throat: Oropharynx is clear and moist.  Neck: Normal range of motion. Neck supple. No thyromegaly present.  Cardiovascular: Normal rate, regular rhythm and normal heart sounds.   Pulmonary/Chest: Effort normal and breath sounds normal.  Abdominal: Soft. Bowel sounds  are normal. There is no tenderness. There is no rebound and no guarding.  Musculoskeletal: Normal range of motion.  Neurological: He is alert and oriented to person, place, and time. He has normal reflexes.  Skin: Skin is warm and dry.  Psychiatric: He has a normal mood and affect.     EKG: Sinus brady, WNL     Assessment & Plan:  Assessment: 1. Atypical chest pain  Plan: After consult inwith Dr. Kirtland Bouchard in the office today I have encouraged patient to continue aspirin 81 mg once daily. Next glycerin prescription  sent to the pharmacy and usage, side effects were also discussed. Refer to cardiology to be seen within the next week. Advised patient to go to the emergency department if his symptoms increase in frequency or intensity

## 2012-07-18 NOTE — ED Provider Notes (Signed)
History     CSN: [id number]  Arrival date & time 07/18/12  1421   First MD Initiated Contact with Patient 07/18/12 1633      Chief Complaint  Patient presents with  . Chest Pain  . Shortness of Breath    (Consider location/radiation/quality/duration/timing/severity/associated sxs/prior treatment) HPI Comments: Patient presents with left-sided chest pain. He states he's been having intermittent episodes for the last week. He says they're getting more frequent in intensifying. Today he was having some left-sided chest tightness was radiating to his left arm and his neck. He's had some shortness of breath with exertion the shortness of breath is not related to the chest pain. He denies any nausea or diaphoresis. He has a history of atrial fibrillation but no other cardiac history. He has borderline hyperlipidemia. He is not currently in any lipid lowering agents. He does not currently have a cardiologist. He today went to his primary care physician at [facility]. They contacted [place] [facility] and the patient was told to come over here for further evaluation. The patient is wanting to have a cardiac catheterization to assess his arteries. He denies any family history of early heart disease. He's a former smoker but quit 25 years ago.  Patient is a 62 y.o. male presenting with chest pain and shortness of breath.  Chest Pain Associated symptoms: shortness of breath   Associated symptoms: no abdominal pain, no back pain, no cough, no diaphoresis, no dizziness, no fatigue, no fever, no headache, no nausea, no numbness, not vomiting and no weakness   Shortness of Breath Associated symptoms: chest pain   Associated symptoms: no abdominal pain, no cough, no diaphoresis, no fever, no headaches, no rash and no vomiting     Past Medical History  Diagnosis Date  . HYPERLIPIDEMIA 05/11/2007  . Atrial fibrillation 05/26/2009  . Headache(784.0) 03/23/2009  . Cholelithiasis     No past surgical history  on file.  Family History  Problem Relation Age of Onset  . Cancer Mother     breast  . Heart disease Father     hx A-Fib  . Cancer Father 53    metastatic to lung    History  Substance Use Topics  . Smoking status: Former Smoker -- 10 years    Types: Cigarettes    Quit date: 10/24/1988  . Smokeless tobacco: Not on file  . Alcohol Use: No      Review of Systems  Constitutional: Negative for fever, chills, diaphoresis and fatigue.  HENT: Negative for congestion, rhinorrhea and sneezing.   Eyes: Negative.   Respiratory: Positive for shortness of breath. Negative for cough and chest tightness.   Cardiovascular: Positive for chest pain. Negative for leg swelling.  Gastrointestinal: Negative for nausea, vomiting, abdominal pain, diarrhea and blood in stool.  Genitourinary: Negative for frequency, hematuria, flank pain and difficulty urinating.  Musculoskeletal: Negative for back pain and arthralgias.  Skin: Negative for rash.  Neurological: Negative for dizziness, speech difficulty, weakness, numbness and headaches.    Allergies  Review of patient's allergies indicates no known allergies.  Home Medications   Current Outpatient Rx  Name  Route  Sig  Dispense  Refill  . aspirin 81 MG EC tablet   Oral   Take 1 tablet (81 mg total) by mouth daily. Swallow whole.   30 tablet   12     BP 115/68  Pulse 61  Temp(Src) 98.8 F (37.1 C) (Oral)  Resp 16  SpO2 98%  Physical Exam  Constitutional:  He is oriented to person, place, and time. He appears well-developed and well-nourished.  HENT:  Head: Normocephalic and atraumatic.  Eyes: Pupils are equal, round, and reactive to light.  Neck: Normal range of motion. Neck supple.  Cardiovascular: Normal rate, regular rhythm and normal heart sounds.   Pulmonary/Chest: Effort normal and breath sounds normal. No respiratory distress. He has no wheezes. He has no rales. He exhibits no tenderness.  Abdominal: Soft. Bowel sounds are  normal. There is no tenderness. There is no rebound and no guarding.  Musculoskeletal: Normal range of motion. He exhibits no edema.  Lymphadenopathy:    He has no cervical adenopathy.  Neurological: He is alert and oriented to person, place, and time.  Skin: Skin is warm and dry. No rash noted.  Psychiatric: He has a normal mood and affect.    ED Course  Procedures (including critical care time)  Labs Reviewed  CBC WITH DIFFERENTIAL - Abnormal; Notable for the following:    HCT 37.8 (*)    All other components within normal limits  BASIC METABOLIC PANEL - Abnormal; Notable for the following:    GFR calc non Af Amer 76 (*)    GFR calc Af Amer 88 (*)    All other components within normal limits  POCT I-STAT TROPONIN I   Dg Chest 2 View  07/18/2012   *RADIOLOGY REPORT*  Clinical Data: Chest pain and shortness of breath  CHEST - 2 VIEW  Comparison: None.  Findings: Normal cardiac silhouette and mediastinal contours.  No focal airspace opacities.  No pleural effusion or pneumothorax.  No definite evidence of edema.  No acute osseous abnormalities.  IMPRESSION: No acute cardiopulmonary disease.   Original Report Authenticated By: Tacey Ruiz, MD    Date: 07/18/2012  Rate: 69  Rhythm: normal sinus rhythm  QRS Axis: normal  Intervals: normal  ST/T Wave abnormalities: normal  Conduction Disutrbances:none  Narrative Interpretation:   Old EKG Reviewed: unchanged    1. Chest pain   2. Atrial fibrillation   3. GERD (gastroesophageal reflux disease)   4. Other and unspecified hyperlipidemia       MDM  Patient is currently pain-free. He has no ischemic changes on EKG. His troponin is negative. I discussed the case with the hospitalist to admit the patient I also notify Dr. Graciela Husbands with [facility] cardiology who will consult on the patient.        Rolan Bucco, MD 07/18/12 [phone]

## 2012-07-18 NOTE — H&P (Signed)
[facility] History and Physical  Robert David WUJ:[id number] DOB: Jun 23, 1950 DOA: 07/18/2012  Referring physician: Rolan Bucco, MD PCP: Judie Petit, MD   Chief Complaint: Chest pain  HPI: Robert David is a 63 y.o. male past medical history of hyperlipidemia, atrial fibrillation and GERD. Patient given to the hospital because of chest pain. Patient is having some sternal to inframammary chest pain for the past several days. Patient said the pain is mild, probably 2-3/10 it is pressure-like. He does not remember what brings the pain, but definitely not exertion, the pain stays for about 30 minutes and then resolve on its own. He denies any palpitations, syncope or orthopnea, denies any nausea or sweating. Today he wants to see his primary care provider, they did phone consultation with Dr. Graciela Husbands of [facility] cardiology and he advised him to come to the hospital for further evaluation. Initial evaluation in the emergency department showed normal 12-lead EKG and negative cardiac enzymes. Patient denies DM, HTN, PVD or history of premature CAD.   Review of Systems:  Constitutional: negative for anorexia, fevers and sweats Eyes: negative for irritation, redness and visual disturbance Ears, nose, mouth, throat, and face: negative for earaches, epistaxis, nasal congestion and sore throat Respiratory: negative for cough, dyspnea on exertion, sputum and wheezing Cardiovascular: Per HPI Gastrointestinal: negative for abdominal pain, constipation, diarrhea, melena, nausea and vomiting Genitourinary:negative for dysuria, frequency and hematuria Hematologic/lymphatic: negative for bleeding, easy bruising and lymphadenopathy Musculoskeletal:negative for arthralgias, muscle weakness and stiff joints Neurological: negative for coordination problems, gait problems, headaches and weakness Endocrine: negative for diabetic symptoms including polydipsia, polyuria and weight loss Allergic/Immunologic:  negative for anaphylaxis, hay fever and urticaria  Past Medical History  Diagnosis Date  . HYPERLIPIDEMIA 05/11/2007  . Atrial fibrillation 05/26/2009  . Headache(784.0) 03/23/2009  . Cholelithiasis    No past surgical history on file. Social History:  reports that he quit smoking about 23 years ago. His smoking use included Cigarettes. He smoked 0.00 packs per day for 10 years. He does not have any smokeless tobacco history on file. He reports that he does not drink alcohol. His drug history is not on file.   No Known Allergies  Family History  Problem Relation Age of Onset  . Cancer Mother     breast  . Heart disease Father     hx A-Fib  . Cancer Father 75    metastatic to lung    Prior to Admission medications   Medication Sig Start Date End Date Taking? Authorizing Provider  aspirin 81 MG EC tablet Take 1 tablet (81 mg total) by mouth daily. Swallow whole. 07/18/12  Yes Baker Pierini, FNP   Physical Exam: Filed Vitals:   07/18/12 1700 07/18/12 1715 07/18/12 1730 07/18/12 1745  BP: 129/68 141/70 128/76 126/82  Pulse: 57 59 59 58  Temp:      TempSrc:      Resp: 15 17 19 19   SpO2: 97% 98% 97% 97%   General appearance: alert, cooperative and no distress  Head: Normocephalic, without obvious abnormality, atraumatic  Eyes: conjunctivae/corneas clear. PERRL, EOM's intact. Fundi benign.  Nose: Nares normal. Septum midline. Mucosa normal. No drainage or sinus tenderness.  Throat: lips, mucosa, and tongue normal; teeth and gums normal  Neck: Supple, no masses, no cervical lymphadenopathy, no JVD appreciated, no meningeal signs Resp: clear to auscultation bilaterally  Chest wall: no tenderness  Cardio: regular rate and rhythm, S1, S2 normal, no murmur, click, rub or gallop  GI: soft,  non-tender; bowel sounds normal; no masses, no organomegaly  Extremities: extremities normal, atraumatic, no cyanosis or edema  Skin: Skin color, texture, turgor normal. No rashes or lesions   Neurologic: Alert and oriented X 3, normal strength and tone. Normal symmetric reflexes. Normal coordination and gait   Labs on Admission:  Basic Metabolic Panel:  Recent Labs Lab 07/18/12 1432  NA 141  K 3.8  CL 105  CO2 26  GLUCOSE 91  BUN 16  CREATININE 1.02  CALCIUM 9.4   Liver Function Tests: No results found for this basename: AST, ALT, ALKPHOS, BILITOT, PROT, ALBUMIN,  in the last 168 hours No results found for this basename: LIPASE, AMYLASE,  in the last 168 hours No results found for this basename: AMMONIA,  in the last 168 hours CBC:  Recent Labs Lab 07/18/12 1432  WBC 5.8  NEUTROABS 3.7  HGB 13.1  HCT 37.8*  MCV 83.1  PLT 281   Cardiac Enzymes: No results found for this basename: CKTOTAL, CKMB, CKMBINDEX, TROPONINI,  in the last 168 hours  BNP (last 3 results) No results found for this basename: PROBNP,  in the last 8760 hours CBG: No results found for this basename: GLUCAP,  in the last 168 hours  Radiological Exams on Admission: Dg Chest 2 View  07/18/2012   *RADIOLOGY REPORT*  Clinical Data: Chest pain and shortness of breath  CHEST - 2 VIEW  Comparison: None.  Findings: Normal cardiac silhouette and mediastinal contours.  No focal airspace opacities.  No pleural effusion or pneumothorax.  No definite evidence of edema.  No acute osseous abnormalities.  IMPRESSION: No acute cardiopulmonary disease.   Original Report Authenticated By: Tacey Ruiz, MD    EKG: Independently reviewed.   Assessment/Plan Principal Problem:   Chest pain Active Problems:   HYPERLIPIDEMIA   Atrial fibrillation   GERD (gastroesophageal reflux disease)   Chest pain -Atypical chest pain, comes and goes. -Risk factors includes hyperlipidemia, history of smoking. -I will rule out ACS by cycling 3 sets of cardiac enzymes and repeat EKG. -I have asked Dr. Fredderick Phenix to consult cardiology to evaluate the patient. -Is on aspirin, but is atypical, so I will defer starting beta  blockers or heparin.  Atrial fibrillation -Seems to be history of atrial fibrillation, patient is on aspirin not on any antiarrhythmic. -EKG showed normal sinus rhythm.  GERD -Patient said he takes Zantac as needed, this pain does not seem like has GERD burning sensation.  Hyperlipidemia -No statin, check FLP in a.m.  Code Status: Full code Family Communication: Plan discussed with the patient, family at bedside. Disposition Plan: Observation, telemetry  Time spent: 70 minutes  [facility] Pager [phone]  If 7PM-7AM, please contact night-coverage [email or web address] Password [facility] 07/18/2012, 6:16 PM

## 2012-07-18 NOTE — ED Notes (Signed)
Attempted to call report to floor 

## 2012-07-18 NOTE — ED Notes (Signed)
Pt presents to department for evaluation of L sided chest pain radiating to L arm. Ongoing intermittent x1 week. Became worse today. Also states SOB and L arm numbness. 5/10 pain at the time. Pt is conscious alert and oriented x4. Skin warm and dry.

## 2012-07-18 NOTE — Patient Instructions (Signed)
1. Asprin 81mg  once a day 2. NTG as needed every 5 min   Chest Pain (Nonspecific) It is often hard to give a specific diagnosis for the cause of chest pain. There is always a chance that your pain could be related to something serious, such as a heart attack or a blood clot in the lungs. You need to follow up with your caregiver for further evaluation. CAUSES   Heartburn.  Pneumonia or bronchitis.  Anxiety or stress.  Inflammation around your heart (pericarditis) or lung (pleuritis or pleurisy).  A blood clot in the lung.  A collapsed lung (pneumothorax). It can develop suddenly on its own (spontaneous pneumothorax) or from injury (trauma) to the chest.  Shingles infection (herpes zoster virus). The chest wall is composed of bones, muscles, and cartilage. Any of these can be the source of the pain.  The bones can be bruised by injury.  The muscles or cartilage can be strained by coughing or overwork.  The cartilage can be affected by inflammation and become sore (costochondritis). DIAGNOSIS  Lab tests or other studies, such as X-rays, electrocardiography, stress testing, or cardiac imaging, may be needed to find the cause of your pain.  TREATMENT   Treatment depends on what may be causing your chest pain. Treatment may include:  Acid blockers for heartburn.  Anti-inflammatory medicine.  Pain medicine for inflammatory conditions.  Antibiotics if an infection is present.  You may be advised to change lifestyle habits. This includes stopping smoking and avoiding alcohol, caffeine, and chocolate.  You may be advised to keep your head raised (elevated) when sleeping. This reduces the chance of acid going backward from your stomach into your esophagus.  Most of the time, nonspecific chest pain will improve within 2 to 3 days with rest and mild pain medicine. HOME CARE INSTRUCTIONS   If antibiotics were prescribed, take your antibiotics as directed. Finish them even if you  start to feel better.  For the next few days, avoid physical activities that bring on chest pain. Continue physical activities as directed.  Do not smoke.  Avoid drinking alcohol.  Only take over-the-counter or prescription medicine for pain, discomfort, or fever as directed by your caregiver.  Follow your caregiver's suggestions for further testing if your chest pain does not go away.  Keep any follow-up appointments you made. If you do not go to an appointment, you could develop lasting (chronic) problems with pain. If there is any problem keeping an appointment, you must call to reschedule. SEEK MEDICAL CARE IF:   You think you are having problems from the medicine you are taking. Read your medicine instructions carefully.  Your chest pain does not go away, even after treatment.  You develop a rash with blisters on your chest. SEEK IMMEDIATE MEDICAL CARE IF:   You have increased chest pain or pain that spreads to your arm, neck, jaw, back, or abdomen.  You develop shortness of breath, an increasing cough, or you are coughing up blood.  You have severe back or abdominal pain, feel nauseous, or vomit.  You develop severe weakness, fainting, or chills.  You have a fever. THIS IS AN EMERGENCY. Do not wait to see if the pain will go away. Get medical help at once. Call your local emergency services (911 in [place] Do not drive yourself to the hospital. MAKE SURE YOU:   Understand these instructions.  Will watch your condition.  Will get help right away if you are not doing well or get  worse. Document Released: 11/03/2004 Document Revised: 04/18/2011 Document Reviewed: 08/30/2007 [facility] Patient Information 2014 [place], [place].

## 2012-07-18 NOTE — Progress Notes (Signed)
  Subjective:    Patient ID: Robert David, male    DOB: 1950/09/06, 62 y.o.   MRN: [id number]  HPI    Review of Systems     Objective:   Physical Exam        Assessment & Plan:  Attempt to get patient in to see cardiology. Phone consult with Dr. Clide Cliff who suggest patient go the ED. At 1245 pm, I spoke with patient and advised to go to the ED per Dr. 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 Reports he will go tomorrow after the funeral. I have strongly advised him to go and discussed that he is at a moderate high risk of have an acute event given symptoms and age. Patient verbalizes understanding and reports he will try to work something out and go today. If patient calls back and agrees to go to the ED, I will call report to Attending at [facility].

## 2012-07-19 ENCOUNTER — Encounter (HOSPITAL_COMMUNITY)
Admission: EM | Disposition: A | Discharge status: Home / Self Care | Payer: Self-pay | Source: Home / Self Care | Attending: Emergency Medicine

## 2012-07-19 DIAGNOSIS — R079 Chest pain, unspecified: Secondary | ICD-10-CM

## 2012-07-19 HISTORY — PX: LEFT HEART CATHETERIZATION WITH CORONARY ANGIOGRAM: SHX5451

## 2012-07-19 LAB — CBC
HCT: 38.6 % — ABNORMAL LOW (ref 39.0–52.0)
Hemoglobin: 12.8 g/dL — ABNORMAL LOW (ref 13.0–17.0)
MCH: 27.5 pg (ref 26.0–34.0)
MCHC: 33.2 g/dL (ref 30.0–36.0)
MCV: 82.8 fL (ref 78.0–100.0)
Platelets: 207 10*3/uL (ref 150–400)
RBC: 4.66 MIL/uL (ref 4.22–5.81)
WBC: 6.4 10*3/uL (ref 4.0–10.5)

## 2012-07-19 LAB — LIPID PANEL
Cholesterol: 189 mg/dL (ref 0–200)
HDL: 34 mg/dL — ABNORMAL LOW (ref >39)
LDL Cholesterol: 128 mg/dL — ABNORMAL HIGH (ref 0–99)
Total CHOL/HDL Ratio: 5.6 RATIO
VLDL: 27 mg/dL (ref 0–40)

## 2012-07-19 LAB — BASIC METABOLIC PANEL
CO2: 26 mEq/L (ref 19–32)
Chloride: 106 mEq/L (ref 96–112)
Creatinine, Ser: 0.98 mg/dL (ref 0.50–1.35)
GFR calc Af Amer: >90 mL/min (ref >90)
Glucose, Bld: 91 mg/dL (ref 70–99)
Potassium: 3.7 mEq/L (ref 3.5–5.1)
Sodium: 140 mEq/L (ref 135–145)

## 2012-07-19 LAB — HEMOGLOBIN A1C: Mean Plasma Glucose: 114 mg/dL (ref <117)

## 2012-07-19 LAB — PROTIME-INR
INR: 1.1 (ref 0.00–1.49)
Prothrombin Time: 14.1 seconds (ref 11.6–15.2)

## 2012-07-19 SURGERY — LEFT HEART CATHETERIZATION WITH CORONARY ANGIOGRAM
Anesthesia: LOCAL

## 2012-07-19 MED ORDER — LIDOCAINE HCL (PF) 1 % IJ SOLN
INTRAMUSCULAR | Status: AC
  Filled 2012-07-19: qty 30

## 2012-07-19 MED ORDER — HEPARIN (PORCINE) IN NACL 2-0.9 UNIT/ML-% IJ SOLN
INTRAMUSCULAR | Status: AC
  Filled 2012-07-19: qty 1000

## 2012-07-19 MED ORDER — FENTANYL CITRATE 0.05 MG/ML IJ SOLN
INTRAMUSCULAR | Status: AC
  Filled 2012-07-19: qty 2

## 2012-07-19 MED ORDER — HEPARIN SODIUM (PORCINE) 1000 UNIT/ML IJ SOLN
INTRAMUSCULAR | Status: AC
  Filled 2012-07-19: qty 1

## 2012-07-19 MED ORDER — SODIUM CHLORIDE 0.9 % IV SOLN: 1.0000 mL/kg/h | INTRAVENOUS | Status: DC

## 2012-07-19 MED ORDER — VERAPAMIL HCL 2.5 MG/ML IV SOLN
INTRAVENOUS | Status: AC
  Filled 2012-07-19: qty 2

## 2012-07-19 MED ORDER — MIDAZOLAM HCL 2 MG/2ML IJ SOLN
INTRAMUSCULAR | Status: AC
  Filled 2012-07-19: qty 2

## 2012-07-19 NOTE — Interval H&P Note (Signed)
History and Physical Interval Note:  07/19/2012 9:26 AM  Robert David  has presented today for surgery, with the diagnosis of cp  The various methods of treatment have been discussed with the patient and family. After consideration of risks, benefits and other options for treatment, the patient has consented to  Procedure(s): LEFT HEART CATHETERIZATION WITH CORONARY ANGIOGRAM (N/A) as a surgical intervention .  The patient's history has been reviewed, patient examined, no change in status, stable for surgery.  I have reviewed the patient's chart and labs.  Questions were answered to the patient's satisfaction.     [name]

## 2012-07-19 NOTE — CV Procedure (Signed)
  Cardiac Catheterization Procedure Note  Name: Robert David MRN: [id number] DOB: Mar 17, 1950  Procedure: Left Heart Cath, Selective Coronary Angiography, LV angiography  Indication:  Chest pain suggestive of unstable angina  Procedural details: The right groin was prepped, draped, and anesthetized with 1% lidocaine. Using modified Seldinger technique, a 5 French sheath was introduced into the right femoral artery. Standard Judkins catheters were used for coronary angiography and left ventriculography. Catheter exchanges were performed over a guidewire. There were no immediate procedural complications. The patient was transferred to the post catheterization recovery area for further monitoring.  Procedural Findings:   Hemodynamics:     AO 149/78    LV 154/11   Coronary angiography:  Codominant  Coronary dominance: Right  Left mainstem:   Normal  Left anterior descending (LAD):   Normal.  The vessel is large and wraps the apex.    Left circumflex (LCx):  Normal AV groove. MOM moderate sized branching and normal.  PDA moderate sized and normal.    Right coronary artery (RCA):  Codominant.  Normal.  Small PDA normal.    Left ventriculography: Left ventricular systolic function is normal, LVEF is estimated at 65%, there is no significant mitral regurgitation   Final Conclusions:  Normal coronary arteries.  NL LV function.    Recommendations:  No further cardiac work.  Continue evaluation by Dr. Cato Mulligan for other etiologies for his chest discomfort.    [name] 07/19/2012, 10:03 AM

## 2012-07-19 NOTE — Progress Notes (Signed)
Patient ambulated in hall over 600 feet with steady gait. No s/s intolerance noted. Blood pressure and heart rate stable. Discharged to home after discharge instructions reviewed and patient verbalized good understanding with teach back. Written information given.

## 2012-07-19 NOTE — Discharge Summary (Signed)
Physician Discharge Summary  Robert David ZOX:[id number] DOB: 12/07/1950 DOA: 07/18/2012  PCP: Judie Petit, MD  Admit date: 07/18/2012 Discharge date: 07/19/2012  Time spent: 35 minutes  Recommendations for Outpatient Follow-up:  1. Followup with primary care physician for further workup of chest discomfort etiology 2. Continue aspirin  3. Consider statin high intensity dosage, Lipitor 40 mg as an out-patient  Discharge Diagnoses:  Principal Problem:   Chest pain Active Problems:   HYPERLIPIDEMIA   Atrial fibrillation   GERD (gastroesophageal reflux disease)   Discharge Condition: stable  Diet recommendation: heart healthy  Filed Weights   07/18/12 1958 07/19/12 0515  Weight: 94.167 kg (207 lb 9.6 oz) 94.167 kg (207 lb 9.6 oz)    History of present illness:  62 year old male with history of paroxysmal atrial fibrillation not on Anticoagulant, hyperlipidemia or cholelithiasis, hiatal hernia presented after being seen in PCPs office for intermittent chest discomfort on and off for one week. On admission it was noted he 8/10 with radiation between shoulder blades associated with Doe. Patient admitted because of the nature of pain being distinct from hiatal hernia and not really relieved when taking usual Zantac Cardiology saw patient and thought prudent to rule out unstable angina the patient was placed on lovenox and underwent radial cardiac catheterization 07/19/12 results which are below. It was thought that the patient's pain may be primarily reflux related or hiatal hernia type. Patient reports his endoscopy which is normal in the past and has no bleeding in the stool. Patient was kept on his usual aspirin  Procedures: Procedural Findings:  Hemodynamics:  AO 149/78  LV 154/11  Coronary angiography: Codominant  Coronary dominance: Right  Left mainstem: Normal  Left anterior descending (LAD): Normal. The vessel is large and wraps the apex.  Left circumflex (LCx):  Normal AV groove. MOM moderate sized branching and normal. PDA moderate sized and normal.  Right coronary artery (RCA): Codominant. Normal. Small PDA normal.  Left ventriculography: Left ventricular systolic function is normal, LVEF is estimated at 65%, there is no significant mitral regurgitation  Final Conclusions: Normal coronary arteries. NL LV function.    (i.e. Studies not automatically included, echos, thoracentesis, etc; not x-rays)  Consultations:  Cardiology  Discharge Exam: Filed Vitals:   07/19/12 0515 07/19/12 0911 07/19/12 1047 07/19/12 1048  BP: 131/73   164/76  Pulse: 55 56 60 51  Temp: 97.8 F (36.6 C)   98.2 F (36.8 C)  TempSrc: Oral   Oral  Resp: 16   16  Height:      Weight: 94.167 kg (207 lb 9.6 oz)     SpO2: 95%  99% 97%   Alert pleasant oriented no apparent distress  General: EOMI Cardiovascular:  s1 s2 no m/r/g Respiratory: Clinically clear  Discharge Instructions  Discharge Orders   Future Orders Complete By Expires     Diet - low sodium heart healthy  As directed     Discharge instructions  As directed     Comments:      Follow-up with Dr. Cato Mulligan to discuss your further care  You were cared for by a hospitalist during your hospital stay. If you have any questions about your discharge medications or the care you received while you were in the hospital after you are discharged, you can call the unit and asked to speak with the hospitalist on call if the hospitalist that took care of you is not available. Once you are discharged, your primary care physician will handle any further  medical issues. Please note that NO REFILLS for any discharge medications will be authorized once you are discharged, as it is imperative that you return to your primary care physician (or establish a relationship with a primary care physician if you do not have one) for your aftercare needs so that they can reassess your need for medications and monitor your lab values. If  you do not have a primary care physician, you can call [phone] for a physician referral.    Increase activity slowly  As directed         Medication List    TAKE these medications       aspirin 81 MG EC tablet  Take 1 tablet (81 mg total) by mouth daily. Swallow whole.       No Known Allergies    The results of significant diagnostics from this hospitalization (including imaging, microbiology, ancillary and laboratory) are listed below for reference.    Significant Diagnostic Studies: Dg Chest 2 View  07/18/2012   *RADIOLOGY REPORT*  Clinical Data: Chest pain and shortness of breath  CHEST - 2 VIEW  Comparison: None.  Findings: Normal cardiac silhouette and mediastinal contours.  No focal airspace opacities.  No pleural effusion or pneumothorax.  No definite evidence of edema.  No acute osseous abnormalities.  IMPRESSION: No acute cardiopulmonary disease.   Original Report Authenticated By: Tacey Ruiz, MD    Microbiology: No results found for this or any previous visit (from the past 240 hour(s)).   Labs: Basic Metabolic Panel:  Recent Labs Lab 07/18/12 1432 07/19/12 0425  NA 141 140  K 3.8 3.7  CL 105 106  CO2 26 26  GLUCOSE 91 91  BUN 16 15  CREATININE 1.02 0.98  CALCIUM 9.4 8.7   Liver Function Tests: No results found for this basename: AST, ALT, ALKPHOS, BILITOT, PROT, ALBUMIN,  in the last 168 hours No results found for this basename: LIPASE, AMYLASE,  in the last 168 hours No results found for this basename: AMMONIA,  in the last 168 hours CBC:  Recent Labs Lab 07/18/12 1432 07/19/12 0425  WBC 5.8 6.4  NEUTROABS 3.7  --   HGB 13.1 12.8*  HCT 37.8* 38.6*  MCV 83.1 82.8  PLT 281 207   Cardiac Enzymes:  Recent Labs Lab 07/18/12 2206 07/19/12 0425  TROPONINI <0.30 <0.30   BNP: BNP (last 3 results) No results found for this basename: PROBNP,  in the last 8760 hours CBG: No results found for this basename: GLUCAP,  in the last 168  hours     Signed:  Rhetta Mura  [facility] 07/19/2012, 12:19 PM

## 2012-07-19 NOTE — H&P (View-Only) (Signed)
 ELECTROPHYSIOLOGY CONSULT NOTE  Patient ID: Robert David, MRN: [id number], DOB/AGE: 05/02/1950 62 y.o. Admit date: 07/18/2012 Date of Consult: 07/18/2012  Primary Physician: SWORDS,BRUCE HENRY, MD Primary Cardiologist: new  Chief Complaint:  Chest pain    HPI Robert David is a 61 y.o. male  Admitted because of chest pain.  He is known atrial fibrillation with an echo 2011 demonstrating normal left ventricular function mild-moderate LAE (41) with some diastolic dysfunction. He also has a history of hiatal hernia  He has had progressive episodes of chest discomfort over the last 5 days. This was initially described as a relatively discrete discomfort over the left breast area. Over the last 3 days however it has been more diffuse midsternal described as a pressure with radiation into the back of the neck and into the left arm. It has not been particularly associated with exertion but seems to come on more with exertion than not. These episodes have lasted 15-30 minutes.   He has noted also some shortness of breath over the last year which is also been somewhat worse over the last couple of days. There has been no associated brackish taste; this discomfort is quite distinct from his hiatal hernia discomfort for which he takes Zantac. He also takes when necessary aspirin..  Cardiac risk factors include age, aspirin use,n LDL was 138. He does not smoke does not have diabetes.  His exercise tolerance. He had no edema.  His most recent episode of tachypalpitations was about one year ago.    Past Medical History  Diagnosis Date  . HYPERLIPIDEMIA 05/11/2007  . Atrial fibrillation 05/26/2009  . Headache(784.0) 03/23/2009  . Cholelithiasis       Surgical History: No past surgical history on file.   Home Meds: Prior to Admission medications   Medication Sig Start Date End Date Taking? Authorizing Provider  aspirin 81 MG EC tablet Take 1 tablet (81 mg total) by mouth daily. Swallow whole.  07/18/12  Yes Padonda B Campbell, FNP    Inpatient Medications:  . aspirin  324 mg Oral Once     Allergies: No Known Allergies  History   Social History  . Marital Status: Single    Spouse Name: N/A    Number of Children: N/A  . Years of Education: N/A   Occupational History  . Not on file.   Social History Main Topics  . Smoking status: Former Smoker -- 10 years    Types: Cigarettes    Quit date: 10/24/1988  . Smokeless tobacco: Not on file  . Alcohol Use: No  . Drug Use: Not on file  . Sexually Active: Not on file   Other Topics Concern  . Not on file   Social History Narrative  . No narrative on file     Family History  Problem Relation Age of Onset  . Cancer Mother     breast  . Heart disease Father     hx A-Fib  . Cancer Father 80    metastatic to lung     ROS:  Please see the history of present illness.   Negative except, which is being followed. All other systems reviewed and negative.    Physical Exam:   Blood pressure 126/82, pulse 58, temperature 98.8 F (37.1 C), temperature source Oral, resp. rate 19, SpO2 97.00%. General: Well developed, well nourished male in no acute distress. Head: Normocephalic, atraumatic, sclera non-icteric, no xanthomas, nares are without discharge. EENT: normal Lymph Nodes:  none Back: without   scoliosis/kyphosis , no CVA tendersness Neck: Negative for carotid bruits. JVD not elevated. Lungs: Clear bilaterally to auscultation without wheezes, rales, or rhonchi. Breathing is unlabored. Heart: RRR with S1 S2. 2/6 murmur at the right upper sternal border and S4 were appreciated. Abdomen: Soft, non-tender, non-distended with normoactive bowel sounds. No hepatomegaly. No rebound/guarding. No obvious abdominal masses. Msk:  Strength and tone appear normal for age. Extremities: No clubbing or cyanosis. No edema.  Distal pedal pulses are 2+ and equal bilaterally. Skin: Warm and Dry Neuro: Alert and oriented X 3. CN III-XII  intact Grossly normal sensory and motor function . Psych:  Responds to questions appropriately with a normal affect.      Labs: Cardiac Enzymes No results found for this basename: CKTOTAL, CKMB, TROPONINI,  in the last 72 hours CBC Lab Results  Component Value Date   WBC 5.8 07/18/2012   HGB 13.1 07/18/2012   HCT 37.8* 07/18/2012   MCV 83.1 07/18/2012   PLT 281 07/18/2012   PROTIME: No results found for this basename: LABPROT, INR,  in the last 72 hours Chemistry  Recent Labs Lab 07/18/12 1432  NA 141  K 3.8  CL 105  CO2 26  BUN 16  CREATININE 1.02  CALCIUM 9.4  GLUCOSE 91   Lipids Lab Results  Component Value Date   CHOL 199 10/03/2011   HDL 41.00 10/03/2011   LDLCALC 138* 10/03/2011   TRIG 98.0 10/03/2011   BNP No results found for this basename: probnp   Miscellaneous No results found for this basename: DDIMER    Radiology/Studies:  Dg Chest 2 View  07/18/2012   *RADIOLOGY REPORT*  Clinical Data: Chest pain and shortness of breath  CHEST - 2 VIEW  Comparison: None.  Findings: Normal cardiac silhouette and mediastinal contours.  No focal airspace opacities.  No pleural effusion or pneumothorax.  No definite evidence of edema.  No acute osseous abnormalities.  IMPRESSION: No acute cardiopulmonary disease.   Original Report Authenticated By: John Watts V, MD    EKG:  Sinus with out STT changes  Prominent T waves but also noted 2013   Assessment and Plan:     Principal Problem:   Chest pain Active Problems:   HYPERLIPIDEMIA   Atrial fibrillation   GERD (gastroesophageal reflux disease)  The patient has concerning chest pain with largely typical features in a high risk demographic but a paucity of risk factors.  He has no structural heart disease based on his echo from 2 years ago with unexplained diastolic dysfunction.  The review diagnostic strategies including stress testing coronary CT angiography versus catheterization. Given the significance of his  symptoms with a rapid progression, relatively long duration and worrisome radiation have elected to pursue diagnosis with catheterization. We reviewed the potential risks including a 1:1000 or so risk of death heart attack and or stroke  Currently on aspirin and subcutaneous heparin.    Steven Klein   

## 2012-07-19 NOTE — Progress Notes (Signed)
TR BAND REMOVAL  LOCATION:    right radial  DEFLATED PER PROTOCOL:    yes  TIME BAND OFF / DRESSING APPLIED:    1230   SITE UPON ARRIVAL:    Level 0  SITE AFTER BAND REMOVAL:    Level 0  REVERSE ALLEN'S TEST:     positive  CIRCULATION SENSATION AND MOVEMENT:    Within Normal Limits   yes  COMMENTS:   Gauze dressing applied secured with medipore tape 1300 Gauze dressing remains dry and intact, pulses right radial and right ulnar +2, CSMs wnls

## 2012-09-07 ENCOUNTER — Encounter: Payer: Self-pay | Admitting: Family Medicine

## 2012-09-07 ENCOUNTER — Telehealth: Payer: Self-pay | Admitting: Family Medicine

## 2012-09-07 ENCOUNTER — Ambulatory Visit (INDEPENDENT_AMBULATORY_CARE_PROVIDER_SITE_OTHER)
Admission: RE | Admit: 2012-09-07 | Discharge: 2012-09-07 | Disposition: A | Discharge status: Home / Self Care | Payer: PRIVATE HEALTH INSURANCE | Source: Ambulatory Visit | Attending: Family Medicine | Admitting: Family Medicine

## 2012-09-07 ENCOUNTER — Ambulatory Visit (INDEPENDENT_AMBULATORY_CARE_PROVIDER_SITE_OTHER): Payer: PRIVATE HEALTH INSURANCE | Admitting: Family Medicine

## 2012-09-07 VITALS — BP 130/84 | Temp 98.9°F | Wt 212.0 lb

## 2012-09-07 DIAGNOSIS — M25569 Pain in unspecified knee: Secondary | ICD-10-CM

## 2012-09-07 DIAGNOSIS — M25561 Pain in right knee: Secondary | ICD-10-CM

## 2012-09-07 MED ORDER — TRAMADOL HCL 50 MG PO TABS: 50.0000 mg | ORAL_TABLET | Freq: Three times a day (TID) | ORAL | Status: DC | PRN

## 2012-09-07 NOTE — Telephone Encounter (Signed)
[phone] (home) [phone] (work)

## 2012-09-07 NOTE — Patient Instructions (Addendum)
-  go get xrays today  -ice 15 minutes twice dialy  -tylenol 500-1000mg  or ibuprfen 400-600mg  up to 3 times daily if needed for pain; if not working for pain can try ultram   -follow up in 5-7 days or sooner if concerns

## 2012-09-07 NOTE — Progress Notes (Signed)
Quick Note:  Called and spoke with pt and pt is aware. Pt would like referral. ______ 

## 2012-09-07 NOTE — Progress Notes (Signed)
Quick Note:  Spoke with Joni Reining and advised pt of Robert David after hours from 5-8 M-F. Pt given information and advised to go on Monday. Offered for pt to go today and pt declined. ______

## 2012-09-07 NOTE — Progress Notes (Signed)
Chief Complaint  Patient presents with  . knee and leg pain    HPI:  Acute visit for knee pain: -reports fell today when missed last step walking down stairs and thinks R leg went under him -had some pain in L knee at the time, but could walk on on both legs after injury -then 30 minutes later R knee started hurting and started getting swelling around R knee and can not bear wight on R leg now and feels  -denies: weakness, numbness, fevers, bleeding, LAC, head injury or injury to the rest of the body  ROS: See pertinent positives and negatives per HPI.  Past Medical History  Diagnosis Date  . HYPERLIPIDEMIA 05/11/2007  . Atrial fibrillation 05/26/2009  . Headache(784.0) 03/23/2009  . Cholelithiasis     Family History  Problem Relation Age of Onset  . Cancer Mother     breast  . Heart disease Father     hx A-Fib  . Cancer Father 28    metastatic to lung    History   Social History  . Marital Status: Married    Spouse Name: N/A    Number of Children: N/A  . Years of Education: N/A   Social History Main Topics  . Smoking status: Former Smoker -- 10 years    Types: Cigarettes    Quit date: 10/24/1988  . Smokeless tobacco: None  . Alcohol Use: No  . Drug Use: None  . Sexually Active: None   Other Topics Concern  . None   Social History Narrative  . None    Current outpatient prescriptions:aspirin 81 MG EC tablet, Take 1 tablet (81 mg total) by mouth daily. Swallow whole., Disp: 30 tablet, Rfl: 12;  traMADol (ULTRAM) 50 MG tablet, Take 1 tablet (50 mg total) by mouth every 8 (eight) hours as needed for pain., Disp: 15 tablet, Rfl: 0  EXAM:  Filed Vitals:   09/07/12 1358  BP: 130/84  Temp: 98.9 F (37.2 C)    Body mass index is 30.42 kg/(m^2).  GENERAL: vitals reviewed and listed above, alert, oriented, appears well hydrated and in no acute distress  HEENT: atraumatic, conjunttiva clear, no obvious abnormalities on inspection of external nose and  ears  NECK: no obvious masses on inspection  MS: moves all extremities without noticeable abnormality Antalgic gait, normal ROM and strength and sensation in LE bilat No obvious bruising or swelling on inspection both knees Possible small R knee effusion on palpation with difuse TTP around entire R knee and no focal point more tender then another with some guarding from pt Neg lachman, neg val/var stress, neg ant/post drawer, neg mcmurry NV intact bilat LE  PSYCH: pleasant and cooperative, no obvious depression or anxiety  ASSESSMENT AND PLAN:  Discussed the following assessment and plan:  Knee pain, right - Plan: DG Knee Complete 4 Views Right, traMADol (ULTRAM) 50 MG tablet  -no instability or sig swelling on exam - likely strain of soft tissues; will get plain films given degree of pain, unable to bear weight currently and age to exclude fx -givne degree of pain offered sports med/ortho referral - decided to weight on films;tx conservatively over the weekend with [facility]/ED precuations -Patient advised to return or notify a doctor immediately if symptoms worsen or persist or new concerns arise.  Patient Instructions  -go get xrays today  -ice 15 minutes twice dialy  -tylenol 500-1000mg  or ibuprfen 400-600mg  up to 3 times daily if needed for pain; if not working for  pain can try ultram   -follow up in 5-7 days or sooner if concerns     KIM, HANNAH R.

## 2012-09-17 ENCOUNTER — Encounter: Payer: Self-pay | Admitting: Gastroenterology

## 2012-12-13 ENCOUNTER — Other Ambulatory Visit: Payer: Self-pay

## 2013-01-21 ENCOUNTER — Ambulatory Visit (INDEPENDENT_AMBULATORY_CARE_PROVIDER_SITE_OTHER): Payer: PRIVATE HEALTH INSURANCE

## 2013-01-21 DIAGNOSIS — Z23 Encounter for immunization: Secondary | ICD-10-CM

## 2013-03-18 ENCOUNTER — Ambulatory Visit (INDEPENDENT_AMBULATORY_CARE_PROVIDER_SITE_OTHER): Payer: PRIVATE HEALTH INSURANCE | Admitting: Family

## 2013-03-18 ENCOUNTER — Encounter: Payer: Self-pay | Admitting: Family

## 2013-03-18 VITALS — BP 118/80 | HR 69 | Wt 214.0 lb

## 2013-03-18 DIAGNOSIS — R030 Elevated blood-pressure reading, without diagnosis of hypertension: Secondary | ICD-10-CM

## 2013-03-18 DIAGNOSIS — G4700 Insomnia, unspecified: Secondary | ICD-10-CM

## 2013-03-18 MED ORDER — TRAZODONE HCL 50 MG PO TABS
50.0000 mg | ORAL_TABLET | Freq: Every evening | ORAL | Status: DC | PRN
Start: 2013-03-18 — End: 2013-08-21

## 2013-03-18 NOTE — Progress Notes (Signed)
Subjective:    Patient ID: Robert David, male    DOB: 03-29-1950, 63 y.o.   MRN: [id number]  Hypertension   63 y.o. White male who presents today with chief complaints of "high blood pressure and insomnia". Pt states that he has never had blood pressure problems before, but today he took his blood pressure and got a reading of 170/100, so he immediately made an appointment. Pt denies any new stress, although he is a Company secretary at [employer]. Pt admits to gaining "a few pounds" recently and not eating quite as healthy as he should. Pt also states that he cannot sleep well; normally gets 4-5 hours of sleep per night with 2-3 spells of waking up. Pt takes Benadryl every night to help with sleep, but is finding it non effective, has also tried melatonin for sleep. Denies fever, chills, fatigue.     Review of Systems  Constitutional: Negative.   HENT: Negative.   Eyes: Negative.   Respiratory: Negative.   Cardiovascular: Negative.   Gastrointestinal: Negative.   Endocrine: Negative.   Genitourinary: Negative.   Musculoskeletal: Negative.   Skin: Negative.   Allergic/Immunologic: Negative.   Neurological: Negative.   Hematological: Negative.   Psychiatric/Behavioral: Positive for sleep disturbance.   Past Medical History  Diagnosis Date  . HYPERLIPIDEMIA 05/11/2007  . Atrial fibrillation 05/26/2009  . Headache(784.0) 03/23/2009  . Cholelithiasis     History   Social History  . Marital Status: Married    Spouse Name: N/A    Number of Children: N/A  . Years of Education: N/A   Occupational History  . Not on file.   Social History Main Topics  . Smoking status: Former Smoker -- 10 years    Types: Cigarettes    Quit date: 10/24/1988  . Smokeless tobacco: Not on file  . Alcohol Use: No  . Drug Use: Not on file  . Sexual Activity: Not on file   Other Topics Concern  . Not on file   Social History Narrative  . No narrative on file    No past surgical history on  file.  Family History  Problem Relation Age of Onset  . Cancer Mother     breast  . Heart disease Father     hx A-Fib  . Cancer Father 44    metastatic to lung    No Known Allergies  Current Outpatient Prescriptions on File Prior to Visit  Medication Sig Dispense Refill  . aspirin 81 MG EC tablet Take 1 tablet (81 mg total) by mouth daily. Swallow whole.  30 tablet  12  . traMADol (ULTRAM) 50 MG tablet Take 1 tablet (50 mg total) by mouth every 8 (eight) hours as needed for pain.  15 tablet  0   No current facility-administered medications on file prior to visit.    BP 118/80  Pulse 69  Wt 214 lb (97.07 kg)  SpO2 98%chart    Objective:   Physical Exam  Constitutional: He is oriented to person, place, and time. He appears well-developed and well-nourished. He is active.  Cardiovascular: Normal rate, regular rhythm, S1 normal, S2 normal and normal heart sounds.   BP 132/76 on manuel cuff   Pulmonary/Chest: Effort normal and breath sounds normal.  Abdominal: Soft. Normal appearance and bowel sounds are normal.  Neurological: He is alert and oriented to person, place, and time.  Skin: Skin is warm, dry and intact.  Psychiatric: He has a normal mood and affect. His speech is  normal and behavior is normal. Thought content normal.          Assessment & Plan:  63 y.o. White male who presents today with chief complain of "high blood pressure and insomnia".  Insomnia  - WIll start Trazodone 50 mg. P.o. Qnight as needed for sleep  - Pt encouraged to have good sleep habits such as not eating close to bed time, practicing relaxation techniques.   Pre hypertension - Blood pressure machine is being used at home, pt educated on how to use, and if gets unusual reading (170/100) then should retest 10-15 minutes lates.  - Low sodium diet, decrease caffeine use -Exercise 30 minutes daily to help with weight loss and blood pressure.   Education: Trazodone has a black box warning-->  instructed on what to do if start to develop suicidal ideation or has mood changes.   Follow up: as needed

## 2013-03-18 NOTE — Patient Instructions (Signed)

## 2013-03-18 NOTE — Progress Notes (Signed)
Pre visit review using our clinic review tool, if applicable. No additional management support is needed unless otherwise documented below in the visit note. 

## 2013-04-02 ENCOUNTER — Encounter: Payer: Self-pay | Admitting: Internal Medicine

## 2013-04-02 ENCOUNTER — Ambulatory Visit (INDEPENDENT_AMBULATORY_CARE_PROVIDER_SITE_OTHER): Payer: PRIVATE HEALTH INSURANCE | Admitting: Internal Medicine

## 2013-04-02 VITALS — BP 158/100 | HR 62 | Temp 98.2°F | Resp 20 | Ht 70.0 in | Wt 214.0 lb

## 2013-04-02 DIAGNOSIS — I10 Essential (primary) hypertension: Secondary | ICD-10-CM

## 2013-04-02 NOTE — Patient Instructions (Addendum)
Limit your sodium (Salt) intake    It is important that you exercise regularly, at least 20 minutes 3 to 4 times per week.  If you develop chest pain or shortness of breath seek  medical attention.  You need to lose weight.  Consider a lower calorie diet and regular exercise.  Return in one month for follow-up  Please check your blood pressure on a regular basis.  If it is consistently greater than 150/90, please make an office appointment.    DASH Diet The DASH diet stands for "Dietary Approaches to Stop Hypertension." It is a healthy eating plan that has been shown to reduce high blood pressure (hypertension) in as little as 14 days, while also possibly providing other significant health benefits. These other health benefits include reducing the risk of breast cancer after menopause and reducing the risk of type 2 diabetes, heart disease, colon cancer, and stroke. Health benefits also include weight loss and slowing kidney failure in patients with chronic kidney disease.  DIET GUIDELINES  Limit salt (sodium). Your diet should contain less than 1500 mg of sodium daily.  Limit refined or processed carbohydrates. Your diet should include mostly whole grains. Desserts and added sugars should be used sparingly.  Include small amounts of heart-healthy fats. These types of fats include nuts, oils, and tub margarine. Limit saturated and trans fats. These fats have been shown to be harmful in the body. CHOOSING FOODS  The following food groups are based on a 2000 calorie diet. See your Registered Dietitian for individual calorie needs. Grains and Grain Products (6 to 8 servings daily)  Eat More Often: Whole-wheat bread, brown rice, whole-grain or wheat pasta, quinoa, popcorn without added fat or salt (air popped).  Eat Less Often: White bread, white pasta, white rice, cornbread. Vegetables (4 to 5 servings daily)  Eat More Often: Fresh, frozen, and canned vegetables. Vegetables may be raw,  steamed, roasted, or grilled with a minimal amount of fat.  Eat Less Often/Avoid: Creamed or fried vegetables. Vegetables in a cheese sauce. Fruit (4 to 5 servings daily)  Eat More Often: All fresh, canned (in natural juice), or frozen fruits. Dried fruits without added sugar. One hundred percent fruit juice ( cup [237 mL] daily).  Eat Less Often: Dried fruits with added sugar. Canned fruit in light or heavy syrup. [employer], Fish, and Poultry (2 servings or less daily. One serving is 3 to 4 oz [85-114 g]).  Eat More Often: Ninety percent or leaner ground beef, tenderloin, sirloin. Round cuts of beef, chicken breast, [place] breast. All fish. Grill, bake, or broil your meat. Nothing should be fried.  Eat Less Often/Avoid: Fatty cuts of meat, [place], or chicken leg, thigh, or wing. Fried cuts of meat or fish. Dairy (2 to 3 servings)  Eat More Often: Low-fat or fat-free milk, low-fat plain or light yogurt, reduced-fat or part-skim cheese.  Eat Less Often/Avoid: Milk (whole, 2%).Whole milk yogurt. Full-fat cheeses. Nuts, Seeds, and Legumes (4 to 5 servings per week)  Eat More Often: All without added salt.  Eat Less Often/Avoid: Salted nuts and seeds, canned beans with added salt. Fats and Sweets (limited)  Eat More Often: Vegetable oils, tub margarines without trans fats, sugar-free gelatin. Mayonnaise and salad dressings.  Eat Less Often/Avoid: Coconut oils, palm oils, butter, stick margarine, cream, half and half, cookies, candy, pie. FOR MORE INFORMATION The Dash Diet Eating Plan: www.dashdiet.org Document Released: 01/13/2011 Document Revised: 04/18/2011 Document Reviewed: 01/13/2011 [facility] Patient Information 2014 [place], [place].

## 2013-04-02 NOTE — Progress Notes (Signed)
Pre-visit discussion using our clinic review tool. No additional management support is needed unless otherwise documented below in the visit note.  

## 2013-04-02 NOTE — Progress Notes (Signed)
Subjective:    Patient ID: Robert David, male    DOB: 04/19/1950, 63 y.o.   MRN: [id number]  HPI 63 year old patient who is seen today with blood pressure concerns.  He does monitor her blood pressure readings and has had some consistently high readings.  This has been confirmed that his local drugstore with readings as high as 160 over 100.  He was evaluated recently with a normal blood pressure.  He generally feels well.  He does  adhere to a restricted salt diet  Past Medical History  Diagnosis Date  . HYPERLIPIDEMIA 05/11/2007  . Atrial fibrillation 05/26/2009  . Headache(784.0) 03/23/2009  . Cholelithiasis     History   Social History  . Marital Status: Married    Spouse Name: N/A    Number of Children: N/A  . Years of Education: N/A   Occupational History  . Not on file.   Social History Main Topics  . Smoking status: Former Smoker -- 10 years    Types: Cigarettes    Quit date: 10/24/1988  . Smokeless tobacco: Not on file  . Alcohol Use: No  . Drug Use: Not on file  . Sexual Activity: Not on file   Other Topics Concern  . Not on file   Social History Narrative  . No narrative on file    History reviewed. No pertinent past surgical history.  Family History  Problem Relation Age of Onset  . Cancer Mother     breast  . Heart disease Father     hx A-Fib  . Cancer Father 55    metastatic to lung    No Known Allergies  Current Outpatient Prescriptions on File Prior to Visit  Medication Sig Dispense Refill  . aspirin 81 MG EC tablet Take 1 tablet (81 mg total) by mouth daily. Swallow whole.  30 tablet  12  . traZODone (DESYREL) 50 MG tablet Take 1-2 tablets (50-100 mg total) by mouth at bedtime as needed for sleep.  30 tablet  3   No current facility-administered medications on file prior to visit.    BP 158/100  Pulse 62  Temp(Src) 98.2 F (36.8 C) (Oral)  Resp 20  Ht 5\' 10"  (1.778 m)  Wt 214 lb (97.07 kg)  BMI 30.71 kg/m2  SpO2 98%     Review  of Systems  Constitutional: Negative for fever, chills, appetite change and fatigue.  HENT: Negative for congestion, dental problem, ear pain, hearing loss, sore throat, tinnitus, trouble swallowing and voice change.   Eyes: Negative for pain, discharge and visual disturbance.  Respiratory: Negative for cough, chest tightness, wheezing and stridor.   Cardiovascular: Negative for chest pain, palpitations and leg swelling.  Gastrointestinal: Negative for nausea, vomiting, abdominal pain, diarrhea, constipation, blood in stool and abdominal distention.  Genitourinary: Negative for urgency, hematuria, flank pain, discharge, difficulty urinating and genital sores.  Musculoskeletal: Negative for arthralgias, back pain, gait problem, joint swelling, myalgias and neck stiffness.  Skin: Negative for rash.  Neurological: Negative for dizziness, syncope, speech difficulty, weakness, numbness and headaches.  Hematological: Negative for adenopathy. Does not bruise/bleed easily.  Psychiatric/Behavioral: Negative for behavioral problems and dysphoric mood. The patient is not nervous/anxious.        Objective:   Physical Exam  Constitutional: He appears well-developed and well-nourished. No distress.  Blood pressure 160/100 in both arms  Cardiovascular: Normal rate and regular rhythm.   Pulmonary/Chest: Effort normal and breath sounds normal.  Musculoskeletal: He exhibits no edema.  Assessment & Plan:   Rule out sustained hypertension.  Options were discussed.  He wishes to avoid the drug therapy if possible.  We'll start on an aggressive nonpharmacologic approach with low salt diet, exercise, modest weight loss, with a DASH diet.  We'll continue to follow careful home blood pressure readings and will be reassessed in 4 weeks.  He'll call the office if blood pressures consistently greater than 140 over 90

## 2013-04-03 ENCOUNTER — Telehealth: Payer: Self-pay | Admitting: Internal Medicine

## 2013-04-03 NOTE — Telephone Encounter (Signed)
Relevant patient education assigned to patient using Emmi. ° °

## 2013-05-06 ENCOUNTER — Encounter: Payer: Self-pay | Admitting: Internal Medicine

## 2013-05-06 ENCOUNTER — Ambulatory Visit (INDEPENDENT_AMBULATORY_CARE_PROVIDER_SITE_OTHER): Payer: PRIVATE HEALTH INSURANCE | Admitting: Internal Medicine

## 2013-05-06 VITALS — BP 120/78 | HR 52 | Temp 98.5°F | Resp 20 | Ht 70.0 in | Wt 208.0 lb

## 2013-05-06 DIAGNOSIS — E785 Hyperlipidemia, unspecified: Secondary | ICD-10-CM

## 2013-05-06 DIAGNOSIS — I4891 Unspecified atrial fibrillation: Secondary | ICD-10-CM

## 2013-05-06 NOTE — Progress Notes (Signed)
Pre-visit discussion using our clinic review tool. No additional management support is needed unless otherwise documented below in the visit note.  

## 2013-05-06 NOTE — Patient Instructions (Signed)
Limit your sodium (Salt) intake  Please check your blood pressure on a regular basis.  If it is consistently greater than 150/90, please make an office appointment.  Annual exam in November

## 2013-05-06 NOTE — Progress Notes (Signed)
Subjective:    Patient ID: Robert David, male    DOB: 1950/02/10, 63 y.o.   MRN: [id number]  HPI Wt Readings from Last 3 Encounters:  05/06/13 208 lb (94.348 kg)  04/02/13 214 lb (97.07 kg)  03/18/13 214 lb (97.13 kg)   63 year old patient who was seen one month ago with stage II hypertension.  Options were discussed and he wished to pursue a nonpharmacologic approach.  He has lost 6 pounds in weight and has been exercising very regularly.  Blood pressure readings have nicely normalized.  He feels well today  Past Medical History  Diagnosis Date  . HYPERLIPIDEMIA 05/11/2007  . Atrial fibrillation 05/26/2009  . Headache(784.0) 03/23/2009  . Cholelithiasis     History   Social History  . Marital Status: Married    Spouse Name: N/A    Number of Children: N/A  . Years of Education: N/A   Occupational History  . Not on file.   Social History Main Topics  . Smoking status: Former Smoker -- 10 years    Types: Cigarettes    Quit date: 10/24/1988  . Smokeless tobacco: Not on file  . Alcohol Use: No  . Drug Use: Not on file  . Sexual Activity: Not on file   Other Topics Concern  . Not on file   Social History Narrative  . No narrative on file    History reviewed. No pertinent past surgical history.  Family History  Problem Relation Age of Onset  . Cancer Mother     breast  . Heart disease Father     hx A-Fib  . Cancer Father 35    metastatic to lung    No Known Allergies  Current Outpatient Prescriptions on File Prior to Visit  Medication Sig Dispense Refill  . aspirin 81 MG EC tablet Take 1 tablet (81 mg total) by mouth daily. Swallow whole.  30 tablet  12  . traZODone (DESYREL) 50 MG tablet Take 1-2 tablets (50-100 mg total) by mouth at bedtime as needed for sleep.  30 tablet  3   No current facility-administered medications on file prior to visit.    BP 120/78  Pulse 52  Temp(Src) 98.5 F (36.9 C) (Oral)  Resp 20  Ht 5\' 10"  (1.778 m)  Wt 208 lb  (94.348 kg)  BMI 29.84 kg/m2  SpO2 98%     Review of Systems  Constitutional: Negative for fever, chills, appetite change and fatigue.  HENT: Negative for congestion, dental problem, ear pain, hearing loss, sore throat, tinnitus, trouble swallowing and voice change.   Eyes: Negative for pain, discharge and visual disturbance.  Respiratory: Negative for cough, chest tightness, wheezing and stridor.   Cardiovascular: Negative for chest pain, palpitations and leg swelling.  Gastrointestinal: Negative for nausea, vomiting, abdominal pain, diarrhea, constipation, blood in stool and abdominal distention.  Genitourinary: Negative for urgency, hematuria, flank pain, discharge, difficulty urinating and genital sores.  Musculoskeletal: Negative for arthralgias, back pain, gait problem, joint swelling, myalgias and neck stiffness.  Skin: Negative for rash.  Neurological: Negative for dizziness, syncope, speech difficulty, weakness, numbness and headaches.  Hematological: Negative for adenopathy. Does not bruise/bleed easily.  Psychiatric/Behavioral: Negative for behavioral problems and dysphoric mood. The patient is not nervous/anxious.        Objective:   Physical Exam  Constitutional: He is oriented to person, place, and time. He appears well-developed.  Blood pressure 120/80  HENT:  Head: Normocephalic.  Right Ear: External ear normal.  Left  Ear: External ear normal.  Eyes: Conjunctivae and EOM are normal.  Neck: Normal range of motion.  Cardiovascular: Normal rate and normal heart sounds.   Pulmonary/Chest: Breath sounds normal.  Abdominal: Bowel sounds are normal.  Musculoskeletal: Normal range of motion. He exhibits no edema and no tenderness.  Neurological: He is alert and oriented to person, place, and time.  Psychiatric: He has a normal mood and affect. His behavior is normal.          Assessment & Plan:   Hypertension.  Blood pressure has been well-controlled with a  nonpharmacologic/lifestyle approach.  Will continue home blood pressure monitoring and continue to follow.  Schedule CPX in November

## 2013-05-30 ENCOUNTER — Encounter: Payer: Self-pay | Admitting: Gastroenterology

## 2013-08-20 ENCOUNTER — Encounter: Payer: Self-pay | Admitting: Family

## 2013-08-21 ENCOUNTER — Other Ambulatory Visit: Payer: Self-pay

## 2013-08-21 MED ORDER — TRAZODONE HCL 50 MG PO TABS: 50.0000 mg | ORAL_TABLET | Freq: Every evening | ORAL | Status: DC | PRN

## 2013-10-15 ENCOUNTER — Encounter: Payer: Self-pay | Admitting: Family

## 2013-10-15 ENCOUNTER — Ambulatory Visit (INDEPENDENT_AMBULATORY_CARE_PROVIDER_SITE_OTHER): Payer: PRIVATE HEALTH INSURANCE | Admitting: Family

## 2013-10-15 VITALS — BP 116/80 | HR 87 | Wt 213.0 lb

## 2013-10-15 DIAGNOSIS — M1710 Unilateral primary osteoarthritis, unspecified knee: Secondary | ICD-10-CM

## 2013-10-15 DIAGNOSIS — G4700 Insomnia, unspecified: Secondary | ICD-10-CM

## 2013-10-15 DIAGNOSIS — M1712 Unilateral primary osteoarthritis, left knee: Secondary | ICD-10-CM

## 2013-10-15 MED ORDER — TEMAZEPAM 7.5 MG PO CAPS: 7.5000 mg | ORAL_CAPSULE | Freq: Every evening | ORAL | Status: DC | PRN

## 2013-10-15 MED ORDER — METHYLPREDNISOLONE ACETATE 40 MG/ML IJ SUSP: 40.0000 mg | Freq: Once | INTRAMUSCULAR | Status: DC

## 2013-10-15 MED ORDER — HYDROCODONE-ACETAMINOPHEN 10-325 MG PO TABS: 0.5000 | ORAL_TABLET | Freq: Three times a day (TID) | ORAL | Status: DC | PRN

## 2013-10-15 NOTE — Progress Notes (Signed)
Pre visit review using our clinic review tool, if applicable. No additional management support is needed unless otherwise documented below in the visit note. 

## 2013-10-15 NOTE — Progress Notes (Signed)
Subjective:    Patient ID: Robert David, male    DOB: Jul 30, 1950, 63 y.o.   MRN: [id number]  Knee Pain    63 year old white male, nonsmoker in for recheck of insomnia is in today for his complains of feeling groggy after he takes trazodone at bedtime. He tried to take one half tablet did not work well. Is requesting a different option. Has tried Unisom, Tylenol PM, and Benadryl over-the-counter. He has difficulty maintaining sleep.  Also complains of left knee pain requesting a joint injection. No acute injury. Does report hiking for an extended period of time. Has taken ibuprofen and Tylenol without relief. The pain is 6/10, worse with walking. Better with rest.   Review of Systems  Constitutional: Negative.   HENT: Negative.   Respiratory: Negative.   Cardiovascular: Negative.   Gastrointestinal: Negative.   Endocrine: Negative.   Genitourinary: Negative.   Musculoskeletal: Positive for arthralgias.       Left knee pain  Skin: Negative.   Neurological: Negative.   Hematological: Negative.   Psychiatric/Behavioral: Positive for sleep disturbance.  All other systems reviewed and are negative.  Past Medical History  Diagnosis Date  . HYPERLIPIDEMIA 05/11/2007  . Atrial fibrillation 05/26/2009  . Headache(784.0) 03/23/2009  . Cholelithiasis     History   Social History  . Marital Status: Married    Spouse Name: N/A    Number of Children: N/A  . Years of Education: N/A   Occupational History  . Not on file.   Social History Main Topics  . Smoking status: Former Smoker -- 10 years    Types: Cigarettes    Quit date: 10/24/1988  . Smokeless tobacco: Not on file  . Alcohol Use: No  . Drug Use: Not on file  . Sexual Activity: Not on file   Other Topics Concern  . Not on file   Social History Narrative  . No narrative on file    No past surgical history on file.  Family History  Problem Relation Age of Onset  . Cancer Mother     breast  . Heart disease Father      hx A-Fib  . Cancer Father 66    metastatic to lung    No Known Allergies  Current Outpatient Prescriptions on File Prior to Visit  Medication Sig Dispense Refill  . aspirin 81 MG EC tablet Take 1 tablet (81 mg total) by mouth daily. Swallow whole.  30 tablet  12   No current facility-administered medications on file prior to visit.    BP 116/80  Pulse 87  Wt 213 lb (96.616 kg)chart    Objective:   Physical Exam  Constitutional: He is oriented to person, place, and time. He appears well-developed and well-nourished.  HENT:  Right Ear: External ear normal.  Left Ear: External ear normal.  Nose: Nose normal.  Mouth/Throat: Oropharynx is clear and moist.  Neck: Normal range of motion. Neck supple.  Cardiovascular: Normal rate, regular rhythm and normal heart sounds.   Pulmonary/Chest: Effort normal and breath sounds normal.  Abdominal: Soft. Bowel sounds are normal.  Musculoskeletal: He exhibits tenderness.  Mild tenderness to palpation of the medial aspect of the left knee. Minimal pain with flexion and extension. Positive crepitus-mild.  Neurological: He is alert and oriented to person, place, and time.  Skin: Skin is warm and dry.  Psychiatric: He has a normal mood and affect.   Left knee: Informed consent obtained and the patient's knee was prepped with  betadine. Local anesthesia was obtained with topical spray. Then 40 mg of Depo-Medrol and 1/2 cc of lidocaine was injected into the joint space. The patient tolerated the procedure without complications. Post injection care discussed with patient.       Assessment & Plan:  Robert David was seen today for knee pain and discuss medication.  Diagnoses and associated orders for this visit:  Insomnia  Primary osteoarthritis of left knee - methylPREDNISolone acetate (DEPO-MEDROL) injection 40 mg; Inject 1 mL (40 mg total) into the muscle once.  Other Orders - temazepam (RESTORIL) 7.5 MG capsule; Take 1 capsule (7.5 mg total)  by mouth at bedtime as needed for sleep. - HYDROcodone-acetaminophen (NORCO) 10-325 MG per tablet; Take 0.5-1 tablets by mouth every 8 (eight) hours as needed.   Call the office with any questions or concerns. Recheck as scheduled and as needed.

## 2013-10-15 NOTE — Patient Instructions (Signed)
Knee Exercises EXERCISES RANGE OF MOTION (ROM) AND STRETCHING EXERCISES These exercises may help you when beginning to rehabilitate your injury. Your symptoms may resolve with or without further involvement from your physician, physical therapist, or athletic trainer. While completing these exercises, remember:   Restoring tissue flexibility helps normal motion to return to the joints. This allows healthier, less painful movement and activity.  An effective stretch should be held for at least 30 seconds.  A stretch should never be painful. You should only feel a gentle lengthening or release in the stretched tissue. STRETCH - Knee Extension, Prone  Lie on your stomach on a firm surface, such as a bed or countertop. Place your right / left knee and leg just beyond the edge of the surface. You may wish to place a towel under the far end of your right / left thigh for comfort.  Relax your leg muscles and allow gravity to straighten your knee. Your clinician may advise you to add an ankle weight if more resistance is helpful for you.  You should feel a stretch in the back of your right / left knee. Hold this position for __________ seconds. Repeat __________ times. Complete this stretch __________ times per day. * Your physician, physical therapist, or athletic trainer may ask you to add ankle weight to enhance your stretch.  RANGE OF MOTION - Knee Flexion, Active  Lie on your back with both knees straight. (If this causes back discomfort, bend your opposite knee, placing your foot flat on the floor.)  Slowly slide your heel back toward your buttocks until you feel a gentle stretch in the front of your knee or thigh.  Hold for __________ seconds. Slowly slide your heel back to the starting position. Repeat __________ times. Complete this exercise __________ times per day.  STRETCH - Quadriceps, Prone   Lie on your stomach on a firm surface, such as a bed or padded floor.  Bend your right /  left knee and grasp your ankle. If you are unable to reach your ankle or pant leg, use a belt around your foot to lengthen your reach.  Gently pull your heel toward your buttocks. Your knee should not slide out to the side. You should feel a stretch in the front of your thigh and/or knee.  Hold this position for __________ seconds. Repeat __________ times. Complete this stretch __________ times per day.  STRETCH - Hamstrings, Supine   Lie on your back. Loop a belt or towel over the ball of your right / left foot.  Straighten your right / left knee and slowly pull on the belt to raise your leg. Do not allow the right / left knee to bend. Keep your opposite leg flat on the floor.  Raise the leg until you feel a gentle stretch behind your right / left knee or thigh. Hold this position for __________ seconds. Repeat __________ times. Complete this stretch __________ times per day.  STRENGTHENING EXERCISES These exercises may help you when beginning to rehabilitate your injury. They may resolve your symptoms with or without further involvement from your physician, physical therapist, or athletic trainer. While completing these exercises, remember:   Muscles can gain both the endurance and the strength needed for everyday activities through controlled exercises.  Complete these exercises as instructed by your physician, physical therapist, or athletic trainer. Progress the resistance and repetitions only as guided.  You may experience muscle soreness or fatigue, but the pain or discomfort you are trying to eliminate   should never worsen during these exercises. If this pain does worsen, stop and make certain you are following the directions exactly. If the pain is still present after adjustments, discontinue the exercise until you can discuss the trouble with your clinician. STRENGTH - Quadriceps, Isometrics  Lie on your back with your right / left leg extended and your opposite knee  bent.  Gradually tense the muscles in the front of your right / left thigh. You should see either your knee cap slide up toward your hip or increased dimpling just above the knee. This motion will push the back of the knee down toward the floor/mat/bed on which you are lying.  Hold the muscle as tight as you can without increasing your pain for __________ seconds.  Relax the muscles slowly and completely in between each repetition. Repeat __________ times. Complete this exercise __________ times per day.  STRENGTH - Quadriceps, Short Arcs   Lie on your back. Place a __________ inch towel roll under your knee so that the knee slightly bends.  Raise only your lower leg by tightening the muscles in the front of your thigh. Do not allow your thigh to rise.  Hold this position for __________ seconds. Repeat __________ times. Complete this exercise __________ times per day.  OPTIONAL ANKLE WEIGHTS: Begin with ____________________, but DO NOT exceed ____________________. Increase in 1 pound/0.5 kilogram increments.  STRENGTH - Quadriceps, Straight Leg Raises  Quality counts! Watch for signs that the quadriceps muscle is working to insure you are strengthening the correct muscles and not "cheating" by substituting with healthier muscles.  Lay on your back with your right / left leg extended and your opposite knee bent.  Tense the muscles in the front of your right / left thigh. You should see either your knee cap slide up or increased dimpling just above the knee. Your thigh may even quiver.  Tighten these muscles even more and raise your leg 4 to 6 inches off the floor. Hold for __________ seconds.  Keeping these muscles tense, lower your leg.  Relax the muscles slowly and completely in between each repetition. Repeat __________ times. Complete this exercise __________ times per day.  STRENGTH - Hamstring, Curls  Lay on your stomach with your legs extended. (If you lay on a bed, your feet  may hang over the edge.)  Tighten the muscles in the back of your thigh to bend your right / left knee up to 90 degrees. Keep your hips flat on the bed/floor.  Hold this position for __________ seconds.  Slowly lower your leg back to the starting position. Repeat __________ times. Complete this exercise __________ times per day.  OPTIONAL ANKLE WEIGHTS: Begin with ____________________, but DO NOT exceed ____________________. Increase in 1 pound/0.5 kilogram increments.  STRENGTH - Quadriceps, Squats  Stand in a door frame so that your feet and knees are in line with the frame.  Use your hands for balance, not support, on the frame.  Slowly lower your weight, bending at the hips and knees. Keep your lower legs upright so that they are parallel with the door frame. Squat only within the range that does not increase your knee pain. Never let your hips drop below your knees.  Slowly return upright, pushing with your legs, not pulling with your hands. Repeat __________ times. Complete this exercise __________ times per day.  STRENGTH - Quadriceps, Wall Slides  Follow guidelines for form closely. Increased knee pain often results from poorly placed feet or knees.  Lean   against a smooth wall or door and walk your feet out 18-24 inches. Place your feet hip-width apart.  Slowly slide down the wall or door until your knees bend __________ degrees.* Keep your knees over your heels, not your toes, and in line with your hips, not falling to either side.  Hold for __________ seconds. Stand up to rest for __________ seconds in between each repetition. Repeat __________ times. Complete this exercise __________ times per day. * Your physician, physical therapist, or athletic trainer will alter this angle based on your symptoms and progress. Document Released: 12/08/2004 Document Revised: 06/10/2013 Document Reviewed: 05/08/2008 Exit[employer] Patient Information 2015 [employer]. This information is not  intended to replace advice given to you by your health care provider. Make sure you discuss any questions you have with your health care provider.  

## 2013-10-27 ENCOUNTER — Encounter: Payer: Self-pay | Admitting: Family

## 2013-10-29 ENCOUNTER — Other Ambulatory Visit: Payer: Self-pay | Admitting: Family

## 2013-10-29 DIAGNOSIS — M25562 Pain in left knee: Secondary | ICD-10-CM

## 2013-10-30 ENCOUNTER — Ambulatory Visit (INDEPENDENT_AMBULATORY_CARE_PROVIDER_SITE_OTHER)
Admission: RE | Admit: 2013-10-30 | Discharge: 2013-10-30 | Disposition: A | Discharge status: Home / Self Care | Payer: PRIVATE HEALTH INSURANCE | Source: Ambulatory Visit | Attending: Family | Admitting: Family

## 2013-10-30 DIAGNOSIS — M25569 Pain in unspecified knee: Secondary | ICD-10-CM

## 2013-10-30 DIAGNOSIS — M25562 Pain in left knee: Secondary | ICD-10-CM

## 2013-10-31 ENCOUNTER — Encounter: Payer: Self-pay | Admitting: Family

## 2013-11-05 ENCOUNTER — Other Ambulatory Visit: Payer: Self-pay | Admitting: Family

## 2013-11-05 DIAGNOSIS — M25561 Pain in right knee: Secondary | ICD-10-CM

## 2013-11-05 NOTE — Telephone Encounter (Signed)
I have placed a referral for you. I hope you feel better soon!

## 2013-12-18 ENCOUNTER — Encounter: Payer: Self-pay | Admitting: Internal Medicine

## 2013-12-18 MED ORDER — DILTIAZEM HCL ER 120 MG PO CP24: 120.0000 mg | ORAL_CAPSULE | Freq: Every day | ORAL | Status: DC

## 2013-12-26 ENCOUNTER — Ambulatory Visit: Payer: PRIVATE HEALTH INSURANCE

## 2014-01-16 ENCOUNTER — Encounter (HOSPITAL_COMMUNITY): Payer: Self-pay | Admitting: Cardiology

## 2014-02-24 ENCOUNTER — Ambulatory Visit (INDEPENDENT_AMBULATORY_CARE_PROVIDER_SITE_OTHER): Payer: PRIVATE HEALTH INSURANCE | Admitting: Family Medicine

## 2014-02-24 ENCOUNTER — Encounter: Payer: Self-pay | Admitting: Family Medicine

## 2014-02-24 VITALS — BP 128/80 | Temp 98.2°F | Wt 200.0 lb

## 2014-02-24 DIAGNOSIS — I480 Paroxysmal atrial fibrillation: Secondary | ICD-10-CM

## 2014-02-24 DIAGNOSIS — Z0000 Encounter for general adult medical examination without abnormal findings: Secondary | ICD-10-CM

## 2014-02-24 DIAGNOSIS — Z87891 Personal history of nicotine dependence: Secondary | ICD-10-CM

## 2014-02-24 DIAGNOSIS — IMO0001 Reserved for inherently not codable concepts without codable children: Secondary | ICD-10-CM

## 2014-02-24 DIAGNOSIS — G4700 Insomnia, unspecified: Secondary | ICD-10-CM | POA: Insufficient documentation

## 2014-02-24 LAB — POCT URINALYSIS DIPSTICK
BILIRUBIN UA: NEGATIVE
GLUCOSE UA: NEGATIVE
Ketones, UA: NEGATIVE
Leukocytes, UA: NEGATIVE
Nitrite, UA: NEGATIVE
PROTEIN UA: NEGATIVE
Spec Grav, UA: 1.02
Urobilinogen, UA: 0.2
pH, UA: 5.5

## 2014-02-24 LAB — LIPID PANEL
CHOL/HDL RATIO: 4
Cholesterol: 200 mg/dL (ref 0–200)
HDL: 46.7 mg/dL (ref >39.00)
LDL Cholesterol: 134 mg/dL — ABNORMAL HIGH (ref 0–99)
NonHDL: 153.3
TRIGLYCERIDES: 98 mg/dL (ref 0.0–149.0)
VLDL: 19.6 mg/dL (ref 0.0–40.0)

## 2014-02-24 LAB — PSA: PSA: 1.81 ng/mL (ref 0.10–4.00)

## 2014-02-24 LAB — COMPREHENSIVE METABOLIC PANEL
ALK PHOS: 50 U/L (ref 39–117)
ALT: 27 U/L (ref 0–53)
AST: 23 U/L (ref 0–37)
Albumin: 4.7 g/dL (ref 3.5–5.2)
BUN: 16 mg/dL (ref 6–23)
CALCIUM: 9.9 mg/dL (ref 8.4–10.5)
CHLORIDE: 103 meq/L (ref 96–112)
CO2: 29 mEq/L (ref 19–32)
Creatinine, Ser: 0.94 mg/dL (ref 0.40–1.50)
GFR: 85.55 mL/min (ref >60.00)
Glucose, Bld: 89 mg/dL (ref 70–99)
Potassium: 4 mEq/L (ref 3.5–5.1)
Sodium: 139 mEq/L (ref 135–145)
Total Bilirubin: 0.5 mg/dL (ref 0.2–1.2)
Total Protein: 7.4 g/dL (ref 6.0–8.3)

## 2014-02-24 LAB — TSH: TSH: 1.11 u[IU]/mL (ref 0.35–4.50)

## 2014-02-24 LAB — HEMOGLOBIN A1C: Hgb A1c MFr Bld: 5.9 % (ref 4.6–6.5)

## 2014-02-24 LAB — CBC
HEMATOCRIT: 45.9 % (ref 39.0–52.0)
Hemoglobin: 14.8 g/dL (ref 13.0–17.0)
MCHC: 32.2 g/dL (ref 30.0–36.0)
MCV: 86.1 fl (ref 78.0–100.0)
PLATELETS: 273 10*3/uL (ref 150.0–400.0)
RBC: 5.33 Mil/uL (ref 4.22–5.81)
RDW: 14.8 % (ref 11.5–15.5)
WBC: 6.6 10*3/uL (ref 4.0–10.5)

## 2014-02-24 MED ORDER — DILTIAZEM HCL 60 MG PO TABS: 60.0000 mg | ORAL_TABLET | Freq: Three times a day (TID) | ORAL | Status: DC | PRN

## 2014-02-24 NOTE — Assessment & Plan Note (Addendum)
Patient very aware of when he goes into atrial fibrillation. He takes diltiazem 60mg  as needed when this occurs about every 2-3 months. He prefers this to daily medication. i switched him back though told him I think daily treatment likely more reasonable to keep him out of rapid rate (prescribed by Dr. Raliegh Ip over phone or mychart request then continued at [facility])  when he goes into atrial fibrillation-this possibly could affect diastolic dysfunction as well. CHADSVASC score of 1- continue asa 81mg 

## 2014-02-24 NOTE — Progress Notes (Signed)
Garret Reddish, MD Phone: [phone]  Subjective:  Patient presents today for their annual physical and to  establish care with me as their new primary care provider. Patient was formerly a patient of Dr. Leanne Chang. Also patient at [place].[name] Kitchen Chief complaint-noted.   Dermatology yearly. AAA screen former smoker quit 1990 10 pack years. Regular optho visits yearly. Wants to update fasting labs today. Goes to [employer]. Concerned about Cholesterol and CBG concern. Reports history of possible LFT elevation  Atrial fibrillation, paroxysmal- stable Diltiazem 120mg  24 hr extended release, [facility] but previously was given diltiazem 60mg  tid prn and did well with this with only occasional use.[name] Kitchen Before was on diltiazem TID Prn. Last episode 2-3 months ago. Cath about 2 years ago without any abnormalities.   ROS- occasional palpitations that are relieved with one dose of diltiazem usually less than every 2 months.  No chest pain or shortness of breath. Otherwise full  review of systems was completed and negative except for: nocturia 0-1x.   The following were reviewed and entered/updated in epic: Past Medical History  Diagnosis Date  . HYPERLIPIDEMIA 05/11/2007  . Atrial fibrillation 05/26/2009  . Headache(784.0) 03/23/2009   Patient Active Problem List   Diagnosis Date Noted  . Atrial fibrillation 05/26/2009    Priority: High  . Insomnia 02/24/2014    Priority: Medium  . Diastolic dysfunction 86/57/8469    Priority: Medium  . Hyperlipidemia 05/11/2007    Priority: Medium  . Former smoker 02/24/2014    Priority: Low  . Chest pain 07/18/2012    Priority: Low  . GERD (gastroesophageal reflux disease) 07/18/2012    Priority: Low   Past Surgical History  Procedure Laterality Date  . Left heart catheterization with coronary angiogram N/A 07/19/2012    Procedure: LEFT HEART CATHETERIZATION WITH CORONARY ANGIOGRAM;  Surgeon: Minus Breeding, MD;  Location: [facility] CATH LAB;  Service:  Cardiovascular;  Laterality: N/A;    Family History  Problem Relation Age of Onset  . Cancer Mother     breast  . Heart disease Father     hx A-Fib, 6 with MI  . Colon cancer Father 38    metastatic to lung    Medications- reviewed and updated Current Outpatient Prescriptions  Medication Sig Dispense Refill  . aspirin 81 MG EC tablet Take 1 tablet (81 mg total) by mouth daily. Swallow whole. 30 tablet 12  . diltiazem (CARDIZEM) 60 MG tablet. Was on XL before visit 120mg .  Take 1 tablet (60 mg total) by mouth 3 (three) times daily as needed (atrial fibrillation). 60 tablet 3  . temazepam (RESTORIL) 7.5 MG capsule Take 1 capsule (7.5 mg total) by mouth at bedtime as needed for sleep. (Patient not taking: Reported on 02/24/2014) 30 capsule 1   Allergies-reviewed and updated No Known Allergies  History   Social History  . Marital Status: Married    Spouse Name: N/A    Number of Children: N/A  . Years of Education: N/A   Social History Main Topics  . Smoking status: Former Smoker -- 1.00 packs/day for 10 years    Types: Cigarettes    Quit date: 10/24/1988  . Smokeless tobacco: None  . Alcohol Use: No  . Drug Use: No  . Sexual Activity: None   Other Topics Concern  . None   Social History Narrative   Married (wife [name] (bobby gail) also Dr. Yong Channel patient) [phone]. 3 step children. 3 stepgrandchildren.       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       Hobbies: bowling, golf, exercise     ROS--See HPI   Objective: BP 128/80 mmHg  Temp(Src) 98.2 F (36.8 C)  Wt 200 lb (90.719 kg) Gen: NAD, resting comfortably HEENT: Mucous membranes are moist. Oropharynx normal. Good dentition. TM normal.  CV: RRR no murmurs rubs or gallops Lungs: CTAB no crackles, wheeze, rhonchi Abdomen: soft/nontender/nondistended/normal bowel sounds. No rebound or guarding.  Ext: no edema, 2+ PT pulses Skin: warm, dry, no rash (defers full skin exam) GU: rectal exam with  symmetrical prostate perhaps mild enlargement, no nodules Neuro: grossly normal, moves all extremities, PERRLA   Assessment/Plan:  64 y.o. male presenting for annual physical.  Health Maintenance counseling: 1. Anticipatory guidance: Patient counseled regarding regular dental exams, wearing seatbelts, wearing sunscreen 2. Risk factor reduction:  Advised patient of need for regular exercise and diet rich and fruits and vegetables to reduce risk of heart attack and stroke.  3. Immunizations/screenings/ancillary studies- up to date 4. Update fasting labs today   Atrial fibrillation Patient very aware of when he goes into atrial fibrillation. He takes diltiazem 60mg  as needed when this occurs about every 2-3 months. He prefers this to daily medication. i switched him back though told him I think daily treatment likely more reasonable to keep him out of rapid rate (prescribed by Dr. Raliegh Ip over phone or mychart request then continued at [facility])  when he goes into atrial fibrillation-this possibly could affect diastolic dysfunction as well. CHADSVASC score of 1- continue asa 81mg     Return precautions advised. 6-12 month follow up or prn.   Fasting Mychart message about concerning results-PSA increased 0.8 over 2 years when normal would be about 0.7. Not a major cause for concern but would definitely recheck next year.  Based primarily off your age, you have a 13.3% risk of heart attack or stroke in 10 years. I would recommend considering cholesterol medication but if you continue to work on diet/exercise this risk may come down slightly (though will be hard to fight the age factor). If you desire, we can use a cut off like 20% risk before I push you hard to start a statin.  At risk for diabtes with a1c 5.9 (5.7-6.4 is at risk).  Results for orders placed or performed in visit on 02/24/14 (from the past 24 hour(s))  PSA     Status: None   Collection Time: 02/24/14 12:25 PM  Result Value Ref Range   PSA  1.81 0.10 - 4.00 ng/mL  CBC     Status: None   Collection Time: 02/24/14 12:25 PM  Result Value Ref Range   WBC 6.6 4.0 - 10.5 K/uL   RBC 5.33 4.22 - 5.81 Mil/uL   Platelets 273.0 150.0 - 400.0 K/uL   Hemoglobin 14.8 13.0 - 17.0 g/dL   HCT 45.9 39.0 - 52.0 %   MCV 86.1 78.0 - 100.0 fl   MCHC 32.2 30.0 - 36.0 g/dL   RDW 14.8 11.5 - 15.5 %  Comprehensive metabolic panel     Status: None   Collection Time: 02/24/14 12:25 PM  Result Value Ref Range   Sodium 139 135 - 145 mEq/L   Potassium 4.0 3.5 - 5.1 mEq/L   Chloride 103 96 - 112 mEq/L   CO2 29 19 - 32 mEq/L   Glucose, Bld 89 70 - 99 mg/dL   BUN 16 6 - 23 mg/dL   Creatinine, Ser 0.94 0.40 -  1.50 mg/dL   Total Bilirubin 0.5 0.2 - 1.2 mg/dL   Alkaline Phosphatase 50 39 - 117 U/L   AST 23 0 - 37 U/L   ALT 27 0 - 53 U/L   Total Protein 7.4 6.0 - 8.3 g/dL   Albumin 4.7 3.5 - 5.2 g/dL   Calcium 9.9 8.4 - 10.5 mg/dL   GFR 85.55 >60.00 mL/min  Lipid panel     Status: Abnormal   Collection Time: 02/24/14 12:25 PM  Result Value Ref Range   Cholesterol 200 0 - 200 mg/dL   Triglycerides 98.0 0.0 - 149.0 mg/dL   HDL 46.70 >39.00 mg/dL   VLDL 19.6 0.0 - 40.0 mg/dL   LDL Cholesterol 134 (H) 0 - 99 mg/dL   Total CHOL/HDL Ratio 4    NonHDL 153.30   Hemoglobin A1c     Status: None   Collection Time: 02/24/14 12:25 PM  Result Value Ref Range   Hgb A1c MFr Bld 5.9 4.6 - 6.5 %  TSH     Status: None   Collection Time: 02/24/14 12:25 PM  Result Value Ref Range   TSH 1.11 0.35 - 4.50 uIU/mL  POCT urinalysis dipstick     Status: None   Collection Time: 02/24/14  1:50 PM  Result Value Ref Range   Color, UA yellow    Clarity, UA clear    Glucose, UA n    Bilirubin, UA n    Ketones, UA n    Spec Grav, UA 1.020    Blood, UA trace lysed    pH, UA 5.5    Protein, UA n    Urobilinogen, UA 0.2    Nitrite, UA n    Leukocytes, UA Negative    Meds ordered this encounter  Medications  . diltiazem (CARDIZEM) 60 MG tablet    Sig: Take 1  tablet (60 mg total) by mouth 3 (three) times daily as needed (atrial fibrillation).    Dispense:  60 tablet    Refill:  3

## 2014-02-24 NOTE — Patient Instructions (Addendum)
[facility] given former smoker  Great to meet you!   Thanks for coming in for your physical today. We also switched you back to as needed diltiazem. See [place] back in 6 months. Please record how often you have to use it.   Keep up the great job exercising

## 2014-02-26 ENCOUNTER — Encounter: Payer: Self-pay | Admitting: Family Medicine

## 2014-02-27 ENCOUNTER — Telehealth: Payer: Self-pay

## 2014-02-27 ENCOUNTER — Encounter: Payer: Self-pay | Admitting: Gastroenterology

## 2014-02-27 DIAGNOSIS — Z1211 Encounter for screening for malignant neoplasm of colon: Secondary | ICD-10-CM

## 2014-02-27 NOTE — Telephone Encounter (Signed)
done

## 2014-04-09 ENCOUNTER — Encounter: Payer: Self-pay | Admitting: Family Medicine

## 2014-04-09 NOTE — Telephone Encounter (Signed)
Rx called in 9.8.2015 #30 with 1 rf.  Pt last seen 1.18.2016.  Pls advise.

## 2014-04-09 NOTE — Telephone Encounter (Signed)
Fine to refill with 2 refills

## 2014-04-10 ENCOUNTER — Other Ambulatory Visit: Payer: Self-pay

## 2014-04-10 MED ORDER — TEMAZEPAM 7.5 MG PO CAPS: 7.5000 mg | ORAL_CAPSULE | Freq: Every evening | ORAL | Status: DC | PRN

## 2014-04-29 ENCOUNTER — Ambulatory Visit (AMBULATORY_SURGERY_CENTER): Payer: Self-pay | Admitting: *Deleted

## 2014-04-29 VITALS — Ht 71.0 in | Wt 209.6 lb

## 2014-04-29 DIAGNOSIS — Z800 Family history of malignant neoplasm of digestive organs: Secondary | ICD-10-CM

## 2014-04-29 DIAGNOSIS — Z86010 Personal history of colonic polyps: Secondary | ICD-10-CM

## 2014-04-29 MED ORDER — NA SULFATE-K SULFATE-MG SULF 17.5-3.13-1.6 GM/177ML PO SOLN: 1.0000 | Freq: Once | ORAL | Status: DC

## 2014-04-29 NOTE — Progress Notes (Signed)
No egg or soy allergy No issues with past sedation No home 02 use No diet pills emmi video declined

## 2014-05-20 ENCOUNTER — Encounter: Payer: Self-pay | Admitting: Gastroenterology

## 2014-05-20 ENCOUNTER — Ambulatory Visit (AMBULATORY_SURGERY_CENTER): Payer: PRIVATE HEALTH INSURANCE | Admitting: Gastroenterology

## 2014-05-20 VITALS — BP 145/98 | HR 57 | Temp 96.3°F | Resp 15 | Ht 71.0 in | Wt 209.0 lb

## 2014-05-20 DIAGNOSIS — Z86010 Personal history of colonic polyps: Secondary | ICD-10-CM | POA: Diagnosis not present

## 2014-05-20 DIAGNOSIS — Z800 Family history of malignant neoplasm of digestive organs: Secondary | ICD-10-CM

## 2014-05-20 DIAGNOSIS — K5730 Diverticulosis of large intestine without perforation or abscess without bleeding: Secondary | ICD-10-CM

## 2014-05-20 MED ORDER — SODIUM CHLORIDE 0.9 % IV SOLN: 500.0000 mL | INTRAVENOUS | Status: DC

## 2014-05-20 NOTE — Patient Instructions (Addendum)
YOU HAD AN ENDOSCOPIC PROCEDURE TODAY AT [facility]:   Refer to the procedure report that was given to you for any specific questions about what was found during the examination.  If the procedure report does not answer your questions, please call your gastroenterologist to clarify.  If you requested that your care partner not be given the details of your procedure findings, then the procedure report has been included in a sealed envelope for you to review at your convenience later.  YOU SHOULD EXPECT: Some feelings of bloating in the abdomen. Passage of more gas than usual.  Walking can help get rid of the air that was put into your GI tract during the procedure and reduce the bloating. If you had a lower endoscopy (such as a colonoscopy or flexible sigmoidoscopy) you may notice spotting of blood in your stool or on the toilet paper. If you underwent a bowel prep for your procedure, you may not have a normal bowel movement for a few days.  Please Note:  You might notice some irritation and congestion in your nose or some drainage.  This is from the oxygen used during your procedure.  There is no need for concern and it should clear up in a day or so.  SYMPTOMS TO REPORT IMMEDIATELY:   Following lower endoscopy (colonoscopy or flexible sigmoidoscopy):  Excessive amounts of blood in the stool  Significant tenderness or worsening of abdominal pains  Swelling of the abdomen that is new, acute  Fever of 100F or higher  For urgent or emergent issues, a gastroenterologist can be reached at any hour by calling [phone].   DIET: Your first meal following the procedure should be a small meal and then it is ok to progress to your normal diet. Heavy or fried foods are harder to digest and may make you feel nauseous or bloated.  Likewise, meals heavy in dairy and vegetables can increase bloating.  Drink plenty of fluids but you should avoid alcoholic beverages for 24  hours.  ACTIVITY:  You should plan to take it easy for the rest of today and you should NOT DRIVE or use heavy machinery until tomorrow (because of the sedation medicines used during the test).    FOLLOW UP: Our staff will call the number listed on your records the next business day following your procedure to check on you and address any questions or concerns that you may have regarding the information given to you following your procedure. If we do not reach you, we will leave a message.  However, if you are feeling well and you are not experiencing any problems, there is no need to return our call.  We will assume that you have returned to your regular daily activities without incident.  If any biopsies were taken you will be contacted by phone or by letter within the next 1-3 weeks.  Please call us at [phone] if you have not heard about the biopsies in 3 weeks.    SIGNATURES/CONFIDENTIALITY: You and/or your care partner have signed paperwork which will be entered into your electronic medical record.  These signatures attest to the fact that that the information above on your After Visit Summary has been reviewed and is understood.  Full responsibility of the confidentiality of this discharge information lies with you and/or your care-partner.  Recommendations Discharge instructions given to patient and/or care partner. Next colonoscopy in 10 years. Diverticulosis and high fiber diet handouts provided.

## 2014-05-20 NOTE — Op Note (Addendum)
[facility]  [address] [place], [address]   COLONOSCOPY PROCEDURE REPORT  PATIENT: Robert David, Robert David  MR#: [id number] BIRTHDATE: 01-Aug-1950 , 73  yrs. old GENDER: male ENDOSCOPIST: Inda Castle, MD REFERRED BY: PROCEDURE DATE:  05/20/2014 PROCEDURE:   Colonoscopy, surveillance First Screening Colonoscopy - Avg.  risk and is 50 yrs.  old or older - No.  Prior Negative Screening - Now for repeat screening. N/A  History of Adenoma - Now for follow-up colonoscopy & has been > or = to 3 yrs.  Yes hx of adenoma.  Has been 3 or more years since last colonoscopy. ASA CLASS:   Class II INDICATIONS: PH Colon Adenoma. 2009 MEDICATIONS: Monitored anesthesia care and Propofol 250 mg IV  DESCRIPTION OF PROCEDURE:   After the risks benefits and alternatives of the procedure were thoroughly explained, informed consent was obtained.  The digital rectal exam revealed no abnormalities of the rectum.   The LB [id number]  endoscope was introduced through the anus and advanced to the cecum, which was identified by both the appendix and ileocecal valve. No adverse events experienced.   The quality of the prep was (Suprep was used) excellent.  The instrument was then slowly withdrawn as the colon was fully examined.      COLON FINDINGS: There was moderate diverticulosis noted in the sigmoid colon with associated muscular hypertrophy.   There was mild diverticulosis noted in the ascending colon.  Retroflexed views revealed no abnormalities. The time to cecum = 6.3 Withdrawal time = 8.0   The scope was withdrawn and the procedure completed. COMPLICATIONS: There were no immediate complications.  ENDOSCOPIC IMPRESSION: 1.   There was moderate diverticulosis noted in the sigmoid colon 2.   Mild diverticulosis was noted in the ascending colon  RECOMMENDATIONS: Gi   Recommend follow-up colonoscopy in 10 years  eSigned:  Inda Castle, MD 05/20/2014 10:31 AM Revised:  05/20/2014 10:31 AM  cc: Garret Reddish, MD

## 2014-05-21 ENCOUNTER — Telehealth: Payer: Self-pay | Admitting: *Deleted

## 2014-05-21 NOTE — Telephone Encounter (Signed)
  Follow up Call-  Call back number 05/20/2014  Post procedure Call Back phone  # [phone]  Permission to leave phone message Yes     Patient questions:  Do you have a fever, pain , or abdominal swelling? No. Pain Score  0 *  Have you tolerated food without any problems? Yes.    Have you been able to return to your normal activities? Yes.    Do you have any questions about your discharge instructions: Diet   No. Medications  No. Follow up visit  No.  Do you have questions or concerns about your Care? No.  Actions: * If pain score is 4 or above: No action needed, pain <4.

## 2014-06-13 ENCOUNTER — Encounter: Payer: Self-pay | Admitting: Gastroenterology

## 2014-06-30 ENCOUNTER — Encounter: Payer: Self-pay | Admitting: Family Medicine

## 2014-06-30 ENCOUNTER — Other Ambulatory Visit: Payer: Self-pay

## 2014-06-30 MED ORDER — TRIAMCINOLONE ACETONIDE 0.1 % EX CREA: 1.0000 "application " | TOPICAL_CREAM | Freq: Two times a day (BID) | CUTANEOUS | Status: DC

## 2014-09-01 ENCOUNTER — Ambulatory Visit (INDEPENDENT_AMBULATORY_CARE_PROVIDER_SITE_OTHER): Payer: PRIVATE HEALTH INSURANCE | Admitting: Family Medicine

## 2014-09-01 ENCOUNTER — Encounter: Payer: Self-pay | Admitting: Family Medicine

## 2014-09-01 VITALS — BP 138/86 | HR 52 | Temp 98.2°F | Wt 208.0 lb

## 2014-09-01 DIAGNOSIS — G4700 Insomnia, unspecified: Secondary | ICD-10-CM

## 2014-09-01 DIAGNOSIS — E785 Hyperlipidemia, unspecified: Secondary | ICD-10-CM

## 2014-09-01 DIAGNOSIS — I480 Paroxysmal atrial fibrillation: Secondary | ICD-10-CM

## 2014-09-01 DIAGNOSIS — Z87891 Personal history of nicotine dependence: Secondary | ICD-10-CM

## 2014-09-01 DIAGNOSIS — L259 Unspecified contact dermatitis, unspecified cause: Secondary | ICD-10-CM | POA: Insufficient documentation

## 2014-09-01 DIAGNOSIS — R739 Hyperglycemia, unspecified: Secondary | ICD-10-CM | POA: Diagnosis not present

## 2014-09-01 DIAGNOSIS — R030 Elevated blood-pressure reading, without diagnosis of hypertension: Secondary | ICD-10-CM

## 2014-09-01 LAB — HEMOGLOBIN A1C: Hgb A1c MFr Bld: 5.7 % (ref 4.6–6.5)

## 2014-09-01 MED ORDER — DILTIAZEM HCL ER 120 MG PO CP24: 120.0000 mg | ORAL_CAPSULE | Freq: Every day | ORAL | Status: DC

## 2014-09-01 NOTE — Assessment & Plan Note (Signed)
S: controlled on repeat  BP Readings from Last 3 Encounters:  09/01/14 138/86  05/20/14 145/98  02/24/14 128/80   Home BP monitoring-120-130 at [facility], DBP controlled A/P: wife has cuff which he will use and monitor weekly and bring to next visit for verification.

## 2014-09-01 NOTE — Assessment & Plan Note (Signed)
S:MWF exercising for an hour. workign on weight loss A/P: attempting diet/exercise control with planned repeat 02/2015.

## 2014-09-01 NOTE — Patient Instructions (Addendum)
Monitor blood pressure at least once a week and bring a log (some people write on paper others use excel spreadsheets-do what works for you). Goal <140/90. If regularly above this, come see me sooner than 6 months.   Continue diltiazem for a fib. Continue baby aspirin- if you do develop high blood pressure- we will need to consider a blood thinner.   [facility] about [facility]  Update a1c on your way out through lab   [place] stands for "Dietary Approaches to Stop Hypertension." The DASH eating plan is a healthy eating plan that has been shown to reduce high blood pressure (hypertension). Additional health benefits may include reducing the risk of type 2 diabetes mellitus, heart disease, and stroke. The DASH eating plan may also help with weight loss. WHAT DO I NEED TO KNOW ABOUT THE DASH EATING PLAN? For the DASH eating plan, you will follow these general guidelines:  Choose foods with a percent daily value for sodium of less than 5% (as listed on the food label).  Use salt-free seasonings or herbs instead of table salt or sea salt.  Check with your health care provider or pharmacist before using salt substitutes.  Eat lower-sodium products, often labeled as "lower sodium" or "no salt added."  Eat fresh foods.  Eat more vegetables, fruits, and low-fat dairy products.  Choose whole grains. Look for the word "whole" as the first word in the ingredient list.  Choose fish and skinless chicken or [place] more often than red meat. Limit fish, poultry, and meat to 6 oz (170 g) each day.  Limit sweets, desserts, sugars, and sugary drinks.  Choose heart-healthy fats.  Limit cheese to 1 oz (28 g) per day.  Eat more home-cooked food and less restaurant, buffet, and fast food.  Limit fried foods.  Cook foods using methods other than frying.  Limit canned vegetables. If you do use them, rinse them well to decrease the sodium.  When eating at a restaurant, ask that your  food be prepared with less salt, or no salt if possible. WHAT FOODS CAN I EAT? Seek help from a dietitian for individual calorie needs. Grains Whole grain or whole wheat bread. Brown rice. Whole grain or whole wheat pasta. Quinoa, bulgur, and whole grain cereals. Low-sodium cereals. Corn or whole wheat flour tortillas. Whole grain cornbread. Whole grain crackers. Low-sodium crackers. Vegetables Fresh or frozen vegetables (raw, steamed, roasted, or grilled). Low-sodium or reduced-sodium tomato and vegetable juices. Low-sodium or reduced-sodium tomato sauce and paste. Low-sodium or reduced-sodium canned vegetables.  Fruits All fresh, canned (in natural juice), or frozen fruits. Meat and Other Protein Products Ground beef (85% or leaner), grass-fed beef, or beef trimmed of fat. Skinless chicken or [place]. Ground chicken or [place]. Pork trimmed of fat. All fish and seafood. Eggs. Dried beans, peas, or lentils. Unsalted nuts and seeds. Unsalted canned beans. Dairy Low-fat dairy products, such as skim or 1% milk, 2% or reduced-fat cheeses, low-fat ricotta or cottage cheese, or plain low-fat yogurt. Low-sodium or reduced-sodium cheeses. Fats and Oils Tub margarines without trans fats. Light or reduced-fat mayonnaise and salad dressings (reduced sodium). Avocado. Safflower, olive, or canola oils. Natural peanut or almond butter. Other Unsalted popcorn and pretzels. The items listed above may not be a complete list of recommended foods or beverages. Contact your dietitian for more options. WHAT FOODS ARE NOT RECOMMENDED? Grains White bread. White pasta. White rice. Refined cornbread. Bagels and croissants. Crackers that contain trans fat. Vegetables Creamed or  fried vegetables. Vegetables in a cheese sauce. Regular canned vegetables. Regular canned tomato sauce and paste. Regular tomato and vegetable juices. Fruits Dried fruits. Canned fruit in light or heavy syrup. Fruit juice. Meat and Other  Protein Products Fatty cuts of meat. Ribs, chicken wings, bacon, sausage, bologna, salami, chitterlings, fatback, hot dogs, bratwurst, and packaged luncheon meats. Salted nuts and seeds. Canned beans with salt. Dairy Whole or 2% milk, cream, half-and-half, and cream cheese. Whole-fat or sweetened yogurt. Full-fat cheeses or blue cheese. Nondairy creamers and whipped toppings. Processed cheese, cheese spreads, or cheese curds. Condiments Onion and garlic salt, seasoned salt, table salt, and sea salt. Canned and packaged gravies. Worcestershire sauce. Tartar sauce. Barbecue sauce. Teriyaki sauce. Soy sauce, including reduced sodium. Steak sauce. Fish sauce. Oyster sauce. Cocktail sauce. Horseradish. Ketchup and mustard. Meat flavorings and tenderizers. Bouillon cubes. Hot sauce. Tabasco sauce. Marinades. Taco seasonings. Relishes. Fats and Oils Butter, stick margarine, lard, shortening, ghee, and bacon fat. Coconut, palm kernel, or palm oils. Regular salad dressings. Other Pickles and olives. Salted popcorn and pretzels. The items listed above may not be a complete list of foods and beverages to avoid. Contact your dietitian for more information. WHERE CAN I FIND MORE INFORMATION? National Heart, Lung, and Blood Institute: travelstabloid.com Document Released: 01/13/2011 Document Revised: 06/10/2013 Document Reviewed: 11/28/2012 [facility] Patient Information 2015 [place], [place]. This information is not intended to replace advice given to you by your health care provider. Make sure you discuss any questions you have with your health care provider.

## 2014-09-01 NOTE — Assessment & Plan Note (Signed)
S: swtiched to somnapure - states works better than temazepam did.  A/P: continue current therapy. Reviewed available agents from somnasure on NIH website- appears reasonable

## 2014-09-01 NOTE — Assessment & Plan Note (Addendum)
S: asymptomatic. Not sure last time he has been in a fib. Compliant with Diltiazem 120mg  24 hr. CHADSVASC score of 1- asa 81mg . We are watching BP closely as would lead to score of 2 and need for xarelto likely (states that would be his preference between noacs and coumadin). Does have diastolic dysfunction but no heart failure.  A/P: continue current therapy. Read subjective above.

## 2014-09-01 NOTE — Assessment & Plan Note (Signed)
Encouraged AAA screen through [place].

## 2014-09-01 NOTE — Progress Notes (Signed)
Robert Reddish, MD  Subjective:  Robert David is a 64 y.o. year old very pleasant male patient who presents with: See problem oriented charting ROS-Denies any CP, HA, SOB, palpitations, blurry vision, LE edema, transient weakness, orthopnea, PND.   Past Medical History- diastolic dysfunciton, contact dermatitis, former smoker  Medications- reviewed and updated Current Outpatient Prescriptions  Medication Sig Dispense Refill  . aspirin 81 MG EC tablet Take 1 tablet (81 mg total) by mouth daily. Swallow whole. 30 tablet 12  . diltiazem (CARDIZEM) 60 MG tablet Take 1 tablet (60 mg total) by mouth 3 (three) times daily as needed (atrial fibrillation). 60 tablet 3  . triamcinolone cream (KENALOG) 0.1 % Apply 1 application topically 2 (two) times daily. 80 g 0  . temazepam (RESTORIL) 7.5 MG capsule Take 1 capsule (7.5 mg total) by mouth at bedtime as needed for sleep. (Patient not taking: Reported on 09/01/2014) 30 capsule 2   Objective: BP 138/86 mmHg  Pulse 52  Temp(Src) 98.2 F (36.8 C)  Wt 208 lb (94.348 kg) Gen: NAD, resting comfortably CV: RRR no murmurs rubs or gallops Lungs: CTAB no crackles, wheeze, rhonchi Abdomen: soft/nontender/nondistended/normal bowel sounds. No rebound or guarding.  Ext: no edema Skin: warm, dry, no rash Neuro: grossly normal, moves all extremities   Assessment/Plan:  Atrial fibrillation S: asymptomatic. Not sure last time he has been in a fib. Compliant with Diltiazem 120mg  24 hr. CHADSVASC score of 1- asa 81mg . We are watching BP closely as would lead to score of 2 and need for xarelto likely (states that would be his preference between noacs and coumadin). Does have diastolic dysfunction but no heart failure.  A/P: continue current therapy. Read subjective above.    Hyperlipidemia S:MWF exercising for an hour. workign on weight loss A/P: attempting diet/exercise control with planned repeat 02/2015.    Hyperglycemia S: at risk for diabetes Lab  Results  Component Value Date   HGBA1C 5.9 02/24/2014  A/P: Encouraged need for healthy eating, regular exercise, weight loss.     Former smoker Encouraged AAA screen through [place].   Insomnia S: swtiched to somnapure - states works better than temazepam did.  A/P: continue current therapy. Reviewed available agents from somnasure on NIH website- appears reasonable   Elevated blood pressure (not hypertension) S: controlled on repeat  BP Readings from Last 3 Encounters:  09/01/14 138/86  05/20/14 145/98  02/24/14 128/80   [facility], DBP controlled A/P: wife has cuff which he will use and monitor weekly and bring to next visit for verification.    6 month f/u.   Orders Placed This Encounter  Procedures  . Hemoglobin A1c    [place]   Meds ordered this encounter  Medications  . DISCONTD: diltiazem (DILACOR XR) 120 MG 24 hr capsule    Sig: Take 1 capsule (120 mg total) by mouth daily.    Dispense:  30 capsule    Refill:  6  . diltiazem (DILACOR XR) 120 MG 24 hr capsule    Sig: Take 1 capsule (120 mg total) by mouth daily.    Dispense:  90 capsule    Refill:  3  Was already on dilt XR through [place]- requests fill through this office

## 2014-09-01 NOTE — Assessment & Plan Note (Signed)
S: at risk for diabetes Lab Results  Component Value Date   HGBA1C 5.9 02/24/2014  A/P: Encouraged need for healthy eating, regular exercise, weight loss.

## 2015-01-09 ENCOUNTER — Ambulatory Visit (INDEPENDENT_AMBULATORY_CARE_PROVIDER_SITE_OTHER): Payer: PRIVATE HEALTH INSURANCE

## 2015-01-09 DIAGNOSIS — Z23 Encounter for immunization: Secondary | ICD-10-CM | POA: Diagnosis not present

## 2015-03-09 ENCOUNTER — Encounter: Payer: Self-pay | Admitting: Family Medicine

## 2015-03-09 ENCOUNTER — Ambulatory Visit (INDEPENDENT_AMBULATORY_CARE_PROVIDER_SITE_OTHER): Payer: PRIVATE HEALTH INSURANCE | Admitting: Family Medicine

## 2015-03-09 VITALS — BP 120/70 | HR 77 | Temp 98.3°F | Wt 184.0 lb

## 2015-03-09 DIAGNOSIS — I480 Paroxysmal atrial fibrillation: Secondary | ICD-10-CM

## 2015-03-09 DIAGNOSIS — R351 Nocturia: Secondary | ICD-10-CM | POA: Diagnosis not present

## 2015-03-09 DIAGNOSIS — K219 Gastro-esophageal reflux disease without esophagitis: Secondary | ICD-10-CM

## 2015-03-09 DIAGNOSIS — Z0000 Encounter for general adult medical examination without abnormal findings: Secondary | ICD-10-CM | POA: Diagnosis not present

## 2015-03-09 DIAGNOSIS — E785 Hyperlipidemia, unspecified: Secondary | ICD-10-CM

## 2015-03-09 DIAGNOSIS — R739 Hyperglycemia, unspecified: Secondary | ICD-10-CM

## 2015-03-09 DIAGNOSIS — Z87891 Personal history of nicotine dependence: Secondary | ICD-10-CM | POA: Diagnosis not present

## 2015-03-09 DIAGNOSIS — G4700 Insomnia, unspecified: Secondary | ICD-10-CM

## 2015-03-09 DIAGNOSIS — N401 Enlarged prostate with lower urinary tract symptoms: Secondary | ICD-10-CM | POA: Diagnosis not present

## 2015-03-09 DIAGNOSIS — R319 Hematuria, unspecified: Secondary | ICD-10-CM | POA: Diagnosis not present

## 2015-03-09 DIAGNOSIS — Z23 Encounter for immunization: Secondary | ICD-10-CM

## 2015-03-09 LAB — POCT URINALYSIS DIPSTICK
Bilirubin, UA: NEGATIVE
Glucose, UA: NEGATIVE
Ketones, UA: NEGATIVE
LEUKOCYTES UA: NEGATIVE
NITRITE UA: NEGATIVE
PH UA: 6.5
PROTEIN UA: NEGATIVE
Spec Grav, UA: 1.02
UROBILINOGEN UA: 0.2

## 2015-03-09 LAB — CBC
HEMATOCRIT: 42.8 % (ref 39.0–52.0)
HEMOGLOBIN: 13.7 g/dL (ref 13.0–17.0)
MCHC: 32 g/dL (ref 30.0–36.0)
MCV: 84.3 fl (ref 78.0–100.0)
PLATELETS: 269 10*3/uL (ref 150.0–400.0)
RBC: 5.08 Mil/uL (ref 4.22–5.81)
RDW: 15.5 % (ref 11.5–15.5)
WBC: 5.9 10*3/uL (ref 4.0–10.5)

## 2015-03-09 LAB — PSA: PSA: 1.59 ng/mL (ref 0.10–4.00)

## 2015-03-09 LAB — COMPREHENSIVE METABOLIC PANEL
ALK PHOS: 49 U/L (ref 39–117)
ALT: 20 U/L (ref 0–53)
AST: 19 U/L (ref 0–37)
Albumin: 4.6 g/dL (ref 3.5–5.2)
BILIRUBIN TOTAL: 0.5 mg/dL (ref 0.2–1.2)
BUN: 12 mg/dL (ref 6–23)
CALCIUM: 9.8 mg/dL (ref 8.4–10.5)
CO2: 29 mEq/L (ref 19–32)
Chloride: 108 mEq/L (ref 96–112)
Creatinine, Ser: 1.01 mg/dL (ref 0.40–1.50)
GFR: 78.5 mL/min (ref >60.00)
Glucose, Bld: 96 mg/dL (ref 70–99)
POTASSIUM: 4.9 meq/L (ref 3.5–5.1)
Sodium: 146 mEq/L — ABNORMAL HIGH (ref 135–145)
TOTAL PROTEIN: 6.9 g/dL (ref 6.0–8.3)

## 2015-03-09 LAB — LIPID PANEL
CHOL/HDL RATIO: 5
Cholesterol: 191 mg/dL (ref 0–200)
HDL: 40.6 mg/dL (ref >39.00)
LDL CALC: 127 mg/dL — AB (ref 0–99)
NONHDL: 150.87
Triglycerides: 118 mg/dL (ref 0.0–149.0)
VLDL: 23.6 mg/dL (ref 0.0–40.0)

## 2015-03-09 LAB — HEMOGLOBIN A1C: HEMOGLOBIN A1C: 5.7 % (ref 4.6–6.5)

## 2015-03-09 LAB — URINALYSIS, MICROSCOPIC ONLY: RBC / HPF: NONE SEEN (ref 0–?)

## 2015-03-09 NOTE — Patient Instructions (Addendum)
Please refund copay as was physical  Labs before you go  WOW! Congratulations on all of your hard work! We will update labs- I am hopeful a1c back in normal range and suspect cholesterol will look better.  Wt Readings from Last 3 Encounters:  03/09/15 184 lb (83.462 kg)  09/01/14 208 lb (94.348 kg)  05/20/14 209 lb (94.802 kg)   Make sure to take aspirin everyday and definitely take diltiazem if you note Going into atrial fibrillation  Ask [facility] next visit about aneurysm screening as former smoker

## 2015-03-09 NOTE — Assessment & Plan Note (Signed)
PAF- dilt 120mg  XL has on hand. But has not taken in months and no episodes. Wants to use only if having recurrence. Misses some aspirin- we discussed stroke risk and importance of daily aspirin 81mg . chadsvasc of 1

## 2015-03-09 NOTE — Progress Notes (Signed)
Robert Reddish, MD Phone: [phone]  Subjective:  Patient presents today for their annual physical. Chief complaint-noted.   See problem oriented charting- ROS- full  review of systems was completed and negative except for: denies recent palpitations- none since last visit. No chest pain or shortness of breath. No headache or blurry vision.   The following were reviewed and entered/updated in epic: Past Medical History  Diagnosis Date  . HYPERLIPIDEMIA 05/11/2007  . Atrial fibrillation ([place]) 05/26/2009    under control  . Headache(784.0) 03/23/2009  . Allergy     seasonal   Patient Active Problem List   Diagnosis Date Noted  . Atrial fibrillation ([place]) 05/26/2009    Priority: High  . Hyperglycemia 09/01/2014    Priority: Medium  . Elevated blood pressure (not hypertension) 09/01/2014    Priority: Medium  . Insomnia 02/24/2014    Priority: Medium  . Diastolic dysfunction 99991111    Priority: Medium  . Hyperlipidemia 05/11/2007    Priority: Medium  . Contact dermatitis 09/01/2014    Priority: Low  . Former smoker 02/24/2014    Priority: Low  . Chest pain 07/18/2012    Priority: Low  . GERD (gastroesophageal reflux disease) 07/18/2012    Priority: Low   Past Surgical History  Procedure Laterality Date  . Left heart catheterization with coronary angiogram N/A 07/19/2012    Procedure: LEFT HEART CATHETERIZATION WITH CORONARY ANGIOGRAM;  Surgeon: Minus Breeding, MD;  [facility];  Service: Cardiovascular;  Laterality: N/A;  . Colonoscopy    . Polypectomy      Family History  Problem Relation Age of Onset  . Cancer Mother     breast  . Breast cancer Mother   . Heart disease Father     hx A-Fib, 17 with MI  . Colon cancer Father 20    metastatic to lung  . Rectal cancer Neg Hx   . Stomach cancer Neg Hx     Medications- reviewed and updated Current Outpatient Prescriptions  Medication Sig Dispense Refill  . aspirin 81 MG EC tablet Take 1 tablet  (81 mg total) by mouth daily. Swallow whole. 30 tablet 12  . diltiazem (DILACOR XR) 120 MG 24 hr capsule Take 1 capsule (120 mg total) by mouth daily. 90 capsule 3  . triamcinolone cream (KENALOG) 0.1 % Apply 1 application topically 2 (two) times daily. 80 g 0   No current facility-administered medications for this visit.    Allergies-reviewed and updated No Known Allergies  Social History   Social History  . Marital Status: Married    Spouse Name: N/A  . Number of Children: N/A  . Years of Education: N/A   Social History Main Topics  . Smoking status: Former Smoker -- 1.00 packs/day for 10 years    Types: Cigarettes    Quit date: 10/24/1988  . Smokeless tobacco: Never Used  . Alcohol Use: No  . Drug Use: No  . Sexual Activity: Not Asked   Other Topics Concern  . None   Social History Narrative   Married (wife [name] (bobby gail) also Dr. Yong Channel patient) [phone]. 3 step children. 3 stepgrandchildren.       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       Hobbies: bowling, golf, exercise     ROS--See HPI   Objective: BP 120/70 mmHg  Pulse 77  Temp(Src) 98.3 F (36.8 C)  Wt 184 lb (83.462 kg) Gen: NAD, resting comfortably HEENT: Mucous membranes are moist.  Oropharynx normal Neck: no thyromegaly CV: RRR no murmurs rubs or gallops Lungs: CTAB no crackles, wheeze, rhonchi Abdomen: soft/nontender/nondistended/normal bowel sounds. No rebound or guarding.  Rectal: normal tone, diffusely enlarged prostate, no masses or tenderness Ext: no edema Skin: warm, dry Neuro: grossly normal, moves all extremities, PERRLA  Assessment/Plan:  65 y.o. male presenting for annual physical.  Health Maintenance counseling: 1. Anticipatory guidance: Patient counseled regarding regular dental exams, eye exams, wearing seatbelts.  2. Risk factor reduction:  Advised patient of need for regular exercise and diet rich and fruits and vegetables to reduce risk of heart attack and  stroke. Exercising 3x a week, lost 28 lbs. Patient doing very well  3. Immunizations/screenings/ancillary studies Health Maintenance Due  Topic Date Due  . Hepatitis C Screening - [facility] Dec 17, 1950   4. Prostate cancer screening- low risk rectal except for BPH (nocturia once a night) but update PSA today  Lab Results  Component Value Date   PSA 1.81 02/24/2014   PSA 1.01 10/03/2011   PSA 0.63 11/01/2007   5. Colon cancer screening - 05/20/14 with 10 year repeat 6. Skin cancer screening- Sees Dr. Tarri Glenn in [place] once a year.   Atrial fibrillation PAF- dilt 120mg  XL has on hand. But has not taken in months and no episodes. Wants to use only if having recurrence. Misses some aspirin- we discussed stroke risk and importance of daily aspirin 81mg . chadsvasc of 1  Hyperglycemia Weight watchers and exercise. Lost nearly 30 lbs. a1c 5.9 to 5.7 previously- hopefully in normal range now   Return in about 1 year (around 03/08/2016) for physical. happy to check in 6 months as well. . Return precautions advised.   Orders Placed This Encounter  Procedures  . Pneumococcal conjugate vaccine 13-valent  . Hemoglobin A1c    [place]  . PSA  . Lipid panel    [place]    Order Specific Question:  Has the patient fasted?    Answer:  No  . CBC    [place]  . Comprehensive metabolic panel    [place]    Order Specific Question:  Has the patient fasted?    Answer:  No  . POCT urinalysis dipstick    In house

## 2015-03-09 NOTE — Addendum Note (Signed)
Addended by: Clyde Lundborg A on: 03/09/2015 11:03 AM   Modules accepted: Orders

## 2015-03-09 NOTE — Assessment & Plan Note (Signed)
Weight watchers and exercise. Lost nearly 30 lbs. a1c 5.9 to 5.7 previously- hopefully in normal range now

## 2016-02-03 ENCOUNTER — Telehealth: Payer: Self-pay | Admitting: Family Medicine

## 2016-02-03 ENCOUNTER — Other Ambulatory Visit: Payer: Self-pay

## 2016-02-03 DIAGNOSIS — S82401A Unspecified fracture of shaft of right fibula, initial encounter for closed fracture: Secondary | ICD-10-CM

## 2016-02-03 NOTE — Telephone Encounter (Signed)
Referral placed as requested.

## 2016-02-03 NOTE — Telephone Encounter (Signed)
[place] and would like to be referred out to [place] to make sure that everything is moving in the correct direction.

## 2016-02-05 ENCOUNTER — Telehealth: Payer: Self-pay | Admitting: Family Medicine

## 2016-02-05 NOTE — Telephone Encounter (Signed)
Noted  
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
## 2016-02-06 ENCOUNTER — Encounter: Payer: Self-pay | Admitting: Family Medicine

## 2016-02-06 ENCOUNTER — Ambulatory Visit (INDEPENDENT_AMBULATORY_CARE_PROVIDER_SITE_OTHER): Payer: PRIVATE HEALTH INSURANCE | Admitting: Family Medicine

## 2016-02-06 VITALS — BP 124/70 | HR 103 | Temp 97.7°F | Resp 18

## 2016-02-06 DIAGNOSIS — S82831A Other fracture of upper and lower end of right fibula, initial encounter for closed fracture: Secondary | ICD-10-CM | POA: Diagnosis not present

## 2016-02-06 MED ORDER — KETOROLAC TROMETHAMINE 10 MG PO TABS: 10.0000 mg | ORAL_TABLET | Freq: Four times a day (QID) | ORAL | 0 refills | Status: DC | PRN

## 2016-02-06 NOTE — Progress Notes (Signed)
Pre visit review using our clinic review tool, if applicable. No additional management support is needed unless otherwise documented below in the visit note. 
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
## 2016-02-12 ENCOUNTER — Encounter (HOSPITAL_BASED_OUTPATIENT_CLINIC_OR_DEPARTMENT_OTHER): Payer: Self-pay | Admitting: *Deleted

## 2016-02-12 ENCOUNTER — Ambulatory Visit (INDEPENDENT_AMBULATORY_CARE_PROVIDER_SITE_OTHER): Payer: PRIVATE HEALTH INSURANCE

## 2016-02-12 ENCOUNTER — Ambulatory Visit (INDEPENDENT_AMBULATORY_CARE_PROVIDER_SITE_OTHER): Payer: PRIVATE HEALTH INSURANCE | Admitting: Orthopaedic Surgery

## 2016-02-12 ENCOUNTER — Encounter (INDEPENDENT_AMBULATORY_CARE_PROVIDER_SITE_OTHER): Payer: Self-pay | Admitting: Orthopaedic Surgery

## 2016-02-12 ENCOUNTER — Other Ambulatory Visit (INDEPENDENT_AMBULATORY_CARE_PROVIDER_SITE_OTHER): Payer: Self-pay | Admitting: Orthopaedic Surgery

## 2016-02-12 DIAGNOSIS — S99911A Unspecified injury of right ankle, initial encounter: Secondary | ICD-10-CM | POA: Diagnosis not present

## 2016-02-12 DIAGNOSIS — S82841A Displaced bimalleolar fracture of right lower leg, initial encounter for closed fracture: Secondary | ICD-10-CM

## 2016-02-12 DIAGNOSIS — S82841G Displaced bimalleolar fracture of right lower leg, subsequent encounter for closed fracture with delayed healing: Secondary | ICD-10-CM | POA: Insufficient documentation

## 2016-02-12 MED ORDER — OXYCODONE-ACETAMINOPHEN 5-325 MG PO TABS: 1.0000 | ORAL_TABLET | Freq: Four times a day (QID) | ORAL | 0 refills | Status: DC | PRN
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
## 2016-02-17 ENCOUNTER — Ambulatory Visit (HOSPITAL_COMMUNITY): Payer: PRIVATE HEALTH INSURANCE

## 2016-02-17 ENCOUNTER — Ambulatory Visit (HOSPITAL_BASED_OUTPATIENT_CLINIC_OR_DEPARTMENT_OTHER): Payer: PRIVATE HEALTH INSURANCE | Admitting: Certified Registered"

## 2016-02-17 ENCOUNTER — Encounter (HOSPITAL_BASED_OUTPATIENT_CLINIC_OR_DEPARTMENT_OTHER): Payer: Self-pay | Admitting: Certified Registered"

## 2016-02-17 ENCOUNTER — Encounter (HOSPITAL_BASED_OUTPATIENT_CLINIC_OR_DEPARTMENT_OTHER)
Admission: RE | Disposition: A | Discharge status: Home / Self Care | Payer: Self-pay | Source: Ambulatory Visit | Attending: Orthopaedic Surgery

## 2016-02-17 ENCOUNTER — Ambulatory Visit (HOSPITAL_BASED_OUTPATIENT_CLINIC_OR_DEPARTMENT_OTHER)
Admission: RE | Admit: 2016-02-17 | Discharge: 2016-02-17 | Disposition: A | Discharge status: Home / Self Care | Payer: PRIVATE HEALTH INSURANCE | Source: Ambulatory Visit | Attending: Orthopaedic Surgery | Admitting: Orthopaedic Surgery

## 2016-02-17 DIAGNOSIS — Z8249 Family history of ischemic heart disease and other diseases of the circulatory system: Secondary | ICD-10-CM | POA: Diagnosis not present

## 2016-02-17 DIAGNOSIS — K219 Gastro-esophageal reflux disease without esophagitis: Secondary | ICD-10-CM | POA: Diagnosis not present

## 2016-02-17 DIAGNOSIS — X58XXXA Exposure to other specified factors, initial encounter: Secondary | ICD-10-CM | POA: Insufficient documentation

## 2016-02-17 DIAGNOSIS — S82841A Displaced bimalleolar fracture of right lower leg, initial encounter for closed fracture: Secondary | ICD-10-CM | POA: Insufficient documentation

## 2016-02-17 DIAGNOSIS — Z419 Encounter for procedure for purposes other than remedying health state, unspecified: Secondary | ICD-10-CM

## 2016-02-17 DIAGNOSIS — I4891 Unspecified atrial fibrillation: Secondary | ICD-10-CM | POA: Diagnosis not present

## 2016-02-17 DIAGNOSIS — Y999 Unspecified external cause status: Secondary | ICD-10-CM | POA: Diagnosis not present

## 2016-02-17 DIAGNOSIS — S82841G Displaced bimalleolar fracture of right lower leg, subsequent encounter for closed fracture with delayed healing: Secondary | ICD-10-CM

## 2016-02-17 DIAGNOSIS — E785 Hyperlipidemia, unspecified: Secondary | ICD-10-CM | POA: Insufficient documentation

## 2016-02-17 DIAGNOSIS — Y939 Activity, unspecified: Secondary | ICD-10-CM | POA: Diagnosis not present

## 2016-02-17 DIAGNOSIS — S82843A Displaced bimalleolar fracture of unspecified lower leg, initial encounter for closed fracture: Secondary | ICD-10-CM | POA: Diagnosis not present

## 2016-02-17 DIAGNOSIS — Y9289 Other specified places as the place of occurrence of the external cause: Secondary | ICD-10-CM | POA: Insufficient documentation

## 2016-02-17 DIAGNOSIS — Z86010 Personal history of colonic polyps: Secondary | ICD-10-CM | POA: Insufficient documentation

## 2016-02-17 DIAGNOSIS — Z87891 Personal history of nicotine dependence: Secondary | ICD-10-CM | POA: Insufficient documentation

## 2016-02-17 DIAGNOSIS — Z800 Family history of malignant neoplasm of digestive organs: Secondary | ICD-10-CM | POA: Diagnosis not present

## 2016-02-17 DIAGNOSIS — Z801 Family history of malignant neoplasm of trachea, bronchus and lung: Secondary | ICD-10-CM | POA: Insufficient documentation

## 2016-02-17 DIAGNOSIS — Z79899 Other long term (current) drug therapy: Secondary | ICD-10-CM | POA: Insufficient documentation

## 2016-02-17 DIAGNOSIS — Z803 Family history of malignant neoplasm of breast: Secondary | ICD-10-CM | POA: Insufficient documentation

## 2016-02-17 HISTORY — PX: ORIF ANKLE FRACTURE: SHX5408

## 2016-02-17 SURGERY — OPEN REDUCTION INTERNAL FIXATION (ORIF) ANKLE FRACTURE
Anesthesia: General | Site: Ankle | Laterality: Right

## 2016-02-17 MED ORDER — FENTANYL CITRATE (PF) 100 MCG/2ML IJ SOLN: 25.0000 ug | INTRAMUSCULAR | Status: DC | PRN

## 2016-02-17 MED ORDER — METHYLPREDNISOLONE ACETATE 40 MG/ML IJ SUSP
INTRAMUSCULAR | Status: AC
  Filled 2016-02-17: qty 1

## 2016-02-17 MED ORDER — MIDAZOLAM HCL 2 MG/2ML IJ SOLN
1.0000 mg | INTRAMUSCULAR | Status: DC | PRN
  Administered 2016-02-17: 1 mg via INTRAVENOUS

## 2016-02-17 MED ORDER — SENNOSIDES-DOCUSATE SODIUM 8.6-50 MG PO TABS: 1.0000 | ORAL_TABLET | Freq: Every evening | ORAL | 1 refills | Status: DC | PRN

## 2016-02-17 MED ORDER — FENTANYL CITRATE (PF) 100 MCG/2ML IJ SOLN
50.0000 ug | INTRAMUSCULAR | Status: DC | PRN
  Administered 2016-02-17: 50 ug via INTRAVENOUS

## 2016-02-17 MED ORDER — KETOROLAC TROMETHAMINE 30 MG/ML IJ SOLN: 15.0000 mg | Freq: Once | INTRAMUSCULAR | Status: DC | PRN

## 2016-02-17 MED ORDER — DEXAMETHASONE SODIUM PHOSPHATE 10 MG/ML IJ SOLN
INTRAMUSCULAR | Status: DC | PRN
  Administered 2016-02-17: 10 mg via INTRAVENOUS

## 2016-02-17 MED ORDER — OXYCODONE HCL 5 MG PO TABS: 5.0000 mg | ORAL_TABLET | ORAL | 0 refills | Status: DC | PRN

## 2016-02-17 MED ORDER — SCOPOLAMINE 1 MG/3DAYS TD PT72: 1.0000 | MEDICATED_PATCH | Freq: Once | TRANSDERMAL | Status: DC | PRN

## 2016-02-17 MED ORDER — CEFAZOLIN SODIUM-DEXTROSE 2-4 GM/100ML-% IV SOLN
INTRAVENOUS | Status: AC
  Filled 2016-02-17: qty 100

## 2016-02-17 MED ORDER — PROMETHAZINE HCL 25 MG PO TABS: 25.0000 mg | ORAL_TABLET | Freq: Four times a day (QID) | ORAL | 1 refills | Status: DC | PRN

## 2016-02-17 MED ORDER — PROMETHAZINE HCL 25 MG/ML IJ SOLN: 6.2500 mg | INTRAMUSCULAR | Status: DC | PRN

## 2016-02-17 MED ORDER — OXYCODONE HCL ER 10 MG PO T12A: 10.0000 mg | EXTENDED_RELEASE_TABLET | Freq: Two times a day (BID) | ORAL | 0 refills | Status: DC

## 2016-02-17 MED ORDER — CHLORHEXIDINE GLUCONATE 4 % EX LIQD: 60.0000 mL | Freq: Once | CUTANEOUS | Status: DC

## 2016-02-17 MED ORDER — LIDOCAINE HCL (PF) 1 % IJ SOLN
INTRAMUSCULAR | Status: AC
  Filled 2016-02-17: qty 30

## 2016-02-17 MED ORDER — BUPIVACAINE HCL (PF) 0.5 % IJ SOLN
INTRAMUSCULAR | Status: AC
  Filled 2016-02-17: qty 30

## 2016-02-17 MED ORDER — ASPIRIN EC 325 MG PO TBEC: 325.0000 mg | DELAYED_RELEASE_TABLET | Freq: Two times a day (BID) | ORAL | 0 refills | Status: DC

## 2016-02-17 MED ORDER — METHOCARBAMOL 750 MG PO TABS: 750.0000 mg | ORAL_TABLET | Freq: Two times a day (BID) | ORAL | 0 refills | Status: DC | PRN

## 2016-02-17 MED ORDER — ONDANSETRON HCL 4 MG/2ML IJ SOLN
INTRAMUSCULAR | Status: DC | PRN
  Administered 2016-02-17: 4 mg via INTRAVENOUS

## 2016-02-17 MED ORDER — MIDAZOLAM HCL 2 MG/2ML IJ SOLN
INTRAMUSCULAR | Status: AC
  Filled 2016-02-17: qty 2

## 2016-02-17 MED ORDER — FENTANYL CITRATE (PF) 100 MCG/2ML IJ SOLN
INTRAMUSCULAR | Status: AC
  Filled 2016-02-17: qty 2

## 2016-02-17 MED ORDER — CEFAZOLIN SODIUM-DEXTROSE 2-4 GM/100ML-% IV SOLN
2.0000 g | INTRAVENOUS | Status: AC
  Administered 2016-02-17: 2 g via INTRAVENOUS

## 2016-02-17 MED ORDER — METHYLPREDNISOLONE ACETATE 80 MG/ML IJ SUSP
INTRAMUSCULAR | Status: AC
  Filled 2016-02-17: qty 1

## 2016-02-17 MED ORDER — PROPOFOL 10 MG/ML IV BOLUS
INTRAVENOUS | Status: DC | PRN
  Administered 2016-02-17: 150 mg via INTRAVENOUS

## 2016-02-17 MED ORDER — LACTATED RINGERS IV SOLN
INTRAVENOUS | Status: DC
  Administered 2016-02-17 (×2): via INTRAVENOUS

## 2016-02-17 MED ORDER — EPINEPHRINE 30 MG/30ML IJ SOLN
INTRAMUSCULAR | Status: AC
  Filled 2016-02-17: qty 1

## 2016-02-17 SURGICAL SUPPLY — 81 items
BANDAGE ACE 4X5 VEL STRL LF (GAUZE/BANDAGES/DRESSINGS) IMPLANT
BANDAGE ACE 6X5 VEL STRL LF (GAUZE/BANDAGES/DRESSINGS) ×2 IMPLANT
BANDAGE ESMARK 6X9 LF (GAUZE/BANDAGES/DRESSINGS) ×1 IMPLANT
BIT DRILL 2.6 (BIT) ×2 IMPLANT
BIT DRILL 3.5X122MM AO FIT (BIT) ×2 IMPLANT
BLADE HEX COATED 2.75 (ELECTRODE) ×2 IMPLANT
BLADE SURG 15 STRL LF DISP TIS (BLADE) ×2 IMPLANT
BLADE SURG 15 STRL SS (BLADE) ×2
BNDG COHESIVE 6X5 TAN STRL LF (GAUZE/BANDAGES/DRESSINGS) ×2 IMPLANT
BNDG ESMARK 6X9 LF (GAUZE/BANDAGES/DRESSINGS) ×2
BRUSH SCRUB EZ PLAIN DRY (MISCELLANEOUS) IMPLANT
CANISTER SUCT 1200ML W/VALVE (MISCELLANEOUS) ×6 IMPLANT
COVER BACK TABLE 60X90IN (DRAPES) ×2 IMPLANT
COVER MAYO STAND STRL (DRAPES) IMPLANT
CUFF TOURNIQUET SINGLE 34IN LL (TOURNIQUET CUFF) ×2 IMPLANT
DECANTER SPIKE VIAL GLASS SM (MISCELLANEOUS) IMPLANT
DRAPE C-ARM 42X72 X-RAY (DRAPES) ×2 IMPLANT
DRAPE C-ARMOR (DRAPES) ×2 IMPLANT
DRAPE EXTREMITY T 121X128X90 (DRAPE) ×2 IMPLANT
DRAPE IMP U-DRAPE 54X76 (DRAPES) ×2 IMPLANT
DRAPE SURG 17X23 STRL (DRAPES) ×4 IMPLANT
DRSG PAD ABDOMINAL 8X10 ST (GAUZE/BANDAGES/DRESSINGS) ×4 IMPLANT
DURAPREP 26ML APPLICATOR (WOUND CARE) ×2 IMPLANT
ELECT REM PT RETURN 9FT ADLT (ELECTROSURGICAL) ×2
ELECTRODE REM PT RTRN 9FT ADLT (ELECTROSURGICAL) ×1 IMPLANT
GAUZE SPONGE 4X4 12PLY STRL (GAUZE/BANDAGES/DRESSINGS) ×2 IMPLANT
GAUZE XEROFORM 1X8 LF (GAUZE/BANDAGES/DRESSINGS) ×2 IMPLANT
GLOVE BIO SURGEON STRL SZ 6.5 (GLOVE) ×4 IMPLANT
GLOVE BIOGEL PI IND STRL 7.0 (GLOVE) ×1 IMPLANT
GLOVE BIOGEL PI INDICATOR 7.0 (GLOVE) ×1
GLOVE SKINSENSE NS SZ7.5 (GLOVE) ×1
GLOVE SKINSENSE STRL SZ7.5 (GLOVE) ×1 IMPLANT
GLOVE SURG SYN 7.5  E (GLOVE) ×1
GLOVE SURG SYN 7.5 E (GLOVE) ×1 IMPLANT
GOWN STRL REIN XL XLG (GOWN DISPOSABLE) ×2 IMPLANT
GOWN STRL REUS W/ TWL LRG LVL3 (GOWN DISPOSABLE) ×2 IMPLANT
GOWN STRL REUS W/TWL LRG LVL3 (GOWN DISPOSABLE) ×2
K-WIRE ORTHOPEDIC 1.4X150L (WIRE) ×4
KWIRE ORTHOPEDIC 1.4X150L (WIRE) ×2 IMPLANT
NEEDLE HYPO 22GX1.5 SAFETY (NEEDLE) IMPLANT
NS IRRIG 1000ML POUR BTL (IV SOLUTION) ×6 IMPLANT
PACK BASIN DAY SURGERY FS (CUSTOM PROCEDURE TRAY) ×2 IMPLANT
PAD CAST 3X4 CTTN HI CHSV (CAST SUPPLIES) IMPLANT
PAD CAST 4YDX4 CTTN HI CHSV (CAST SUPPLIES) ×1 IMPLANT
PADDING CAST COTTON 3X4 STRL (CAST SUPPLIES)
PADDING CAST COTTON 4X4 STRL (CAST SUPPLIES) ×1
PADDING CAST COTTON 6X4 STRL (CAST SUPPLIES) ×2 IMPLANT
PADDING CAST SYN 6 (CAST SUPPLIES) ×1
PADDING CAST SYNTHETIC 4 (CAST SUPPLIES) ×1
PADDING CAST SYNTHETIC 4X4 STR (CAST SUPPLIES) ×1 IMPLANT
PADDING CAST SYNTHETIC 6X4 NS (CAST SUPPLIES) ×1 IMPLANT
PENCIL BUTTON HOLSTER BLD 10FT (ELECTRODE) ×2 IMPLANT
PLATE FIBULA 4H (Plate) ×2 IMPLANT
PUTTY DBM STAGRAFT 2CC (Putty) ×2 IMPLANT
SCREW BONE 14MMX3.5MM (Screw) ×2 IMPLANT
SCREW BONE 3.5X20MM (Screw) ×2 IMPLANT
SCREW BONE ANKLE 3.5X24MM (Screw) ×2 IMPLANT
SCREW BONE NON-LCKING 3.5X12MM (Screw) ×4 IMPLANT
SCREW LOCK 3.5X14 (Screw) ×2 IMPLANT
SCREW LOCKING 3.5X16MM (Screw) ×4 IMPLANT
SHEET MEDIUM DRAPE 40X70 STRL (DRAPES) IMPLANT
SLEEVE SCD COMPRESS KNEE MED (MISCELLANEOUS) ×2 IMPLANT
SPLINT FIBERGLASS 4X30 (CAST SUPPLIES) IMPLANT
SPONGE LAP 18X18 X RAY DECT (DISPOSABLE) ×2 IMPLANT
SUCTION FRAZIER HANDLE 10FR (MISCELLANEOUS) ×1
SUCTION TUBE FRAZIER 10FR DISP (MISCELLANEOUS) ×1 IMPLANT
SUT ETHILON 3 0 PS 1 (SUTURE) ×2 IMPLANT
SUT VIC AB 0 CT1 27 (SUTURE)
SUT VIC AB 0 CT1 27XBRD ANBCTR (SUTURE) IMPLANT
SUT VIC AB 2-0 CT1 27 (SUTURE) ×1
SUT VIC AB 2-0 CT1 TAPERPNT 27 (SUTURE) ×1 IMPLANT
SUT VIC AB 3-0 SH 27 (SUTURE)
SUT VIC AB 3-0 SH 27X BRD (SUTURE) IMPLANT
SYR BULB 3OZ (MISCELLANEOUS) ×2 IMPLANT
SYR CONTROL 10ML LL (SYRINGE) IMPLANT
TOWEL OR 17X24 6PK STRL BLUE (TOWEL DISPOSABLE) ×2 IMPLANT
TOWEL OR NON WOVEN STRL DISP B (DISPOSABLE) ×2 IMPLANT
TRAY DSU PREP LF (CUSTOM PROCEDURE TRAY) IMPLANT
TUBE CONNECTING 20X1/4 (TUBING) ×2 IMPLANT
UNDERPAD 30X30 (UNDERPADS AND DIAPERS) ×2 IMPLANT
YANKAUER SUCT BULB TIP NO VENT (SUCTIONS) ×2 IMPLANT

## 2016-02-17 NOTE — Op Note (Signed)
   Date of Surgery: 02/17/2016  INDICATIONS: Mr. Thieu is a 66 y.o.-year-old male who sustained a right ankle fracture; he was indicated for open reduction and internal fixation due to the displaced nature of the articular fracture and came to the operating room today for this procedure. The patient did consent to the procedure after discussion of the risks and benefits.  PREOPERATIVE DIAGNOSIS: right bimalleolar ankle fracture  POSTOPERATIVE DIAGNOSIS: Same.  PROCEDURE: Open treatment of right ankle fracture with internal fixation. Bimalleolar CPT [id number]  SURGEON: N. Eduard Roux, M.D.  ASSIST: Loni Muse, PA student.  ANESTHESIA:  general, regional  TOURNIQUET TIME: 60 mins  IV FLUIDS AND URINE: See anesthesia.  ESTIMATED BLOOD LOSS: minimal mL.  IMPLANTS: St[employer]ker Variax 4 hole distal fibula plate  COMPLICATIONS: None.  DESCRIPTION OF PROCEDURE: The patient was brought to the operating room and placed supine on the operating table.  The patient had been signed prior to the procedure and this was documented. The patient had the anesthesia placed by the anesthesiologist.  A nonsterile tourniquet was placed on the upper thigh.  The prep verification and incision time-outs were performed to confirm that this was the correct patient, site, side and location. The patient had an SCD on the opposite lower extremity. The patient did receive antibiotics prior to the incision and was re-dosed during the procedure as needed at indicated intervals.  The patient had the lower extremity prepped and draped in the standard surgical fashion.  The extremity was exsanguinated using an esmarch bandage and the tourniquet was inflated to 300 mm Hg.  A lateral incision was created over the distal fibula. Dissection was taken down to the fascia. This was sharply incised in line with the incision. Subperiosteal elevation was performed. The fracture was exposed. Organized hematoma and immature callus was removed  from the fracture site. There was a small bony void. The fracture was then reduced and clamped or this was confirmed under fluoroscopy. With the fracture clamped I then placed a posterior anteroposterior lag screw by AO technique. The clamp was removed fracture stay reduced a precontoured plate was then placed on the lateral aspect of the fibula at the appropriate position. Nonlocking and locking screws were placed above and below the fracture each giving excellent purchase. The posterior malleolus was indirectly reduced from the lateral malleolus. A stress exam of the ankle showed no widening of the medial clear space. The wound was then thoroughly irrigated and closed in layer fashion using 2-0 Vicryl and 3-0 nylon. Sterile dressings were applied. Short-leg splint was applied. Patient patient tolerated the procedure well and no immediate complications  POSTOPERATIVE PLAN: Mr. Sposito will remain nonweightbearing on this leg for approximately 6 weeks; Mr. Watland will return for suture removal in 2 weeks.  He will be immobilized in a short leg splint and then transitioned to a CAM walker at his first follow up appointment.  Mr. Bolash will receive DVT prophylaxis based on other medications, activity level, and risk ratio of bleeding to thrombosis.  Azucena Cecil, MD [place] 9:48 AM

## 2016-02-17 NOTE — Anesthesia Procedure Notes (Addendum)
Anesthesia Regional Block:  Popliteal block  Pre-Anesthetic Checklist: ,, timeout performed, Correct Patient, Correct Site, Correct Laterality, Correct Procedure, Correct Position, site marked, Risks and benefits discussed,  Surgical consent,  Pre-op evaluation,  At surgeon's request and post-op pain management  Laterality: Right  Prep: chloraprep       Needles:  Injection technique: Single-shot  Needle Type: Echogenic Needle     Needle Length: 9cm 9 cm Needle Gauge: 21 and 21 G    Additional Needles:  Procedures: ultrasound guided (picture in chart) Popliteal block Narrative:  Start time: 02/17/2016 8:03 AM End time: 02/17/2016 8:10 AM Injection made incrementally with aspirations every 5 mL.  Performed by: Personally  Anesthesiologist: Suzette Battiest

## 2016-02-17 NOTE — Progress Notes (Signed)
Assisted Dr. Rob Fitzgerald with right, ultrasound guided, popliteal block. Side rails up, monitors on throughout procedure. See vital signs in flow sheet. Tolerated Procedure well. 

## 2016-02-17 NOTE — H&P (Signed)
    PREOPERATIVE H&P  Chief Complaint: RIGHT BIMALLEOLAR ANKLE FRACTURE  HPI: Robert David is a 66 y.o. male who presents for surgical treatment of RIGHT BIMALLEOLAR ANKLE FRACTURE.  He denies any changes in medical history.  Past Medical History:  Diagnosis Date  . Allergy    seasonal  . Atrial fibrillation ([place]) 05/26/2009   under control  . Headache(784.0) 03/23/2009  . HYPERLIPIDEMIA 05/11/2007   Past Surgical History:  Procedure Laterality Date  . COLONOSCOPY    . LEFT HEART CATHETERIZATION WITH CORONARY ANGIOGRAM N/A 07/19/2012   Procedure: LEFT HEART CATHETERIZATION WITH CORONARY ANGIOGRAM;  Surgeon: Minus Breeding, MD;  Location: [facility] CATH LAB;  Service: Cardiovascular;  Laterality: N/A;  . POLYPECTOMY     Social History   Social History  . Marital status: Married    Spouse name: N/A  . Number of children: N/A  . Years of education: N/A   Social History Main Topics  . Smoking status: Former Smoker    Packs/day: 1.00    Years: 10.00    Types: Cigarettes    Quit date: 10/24/1988  . Smokeless tobacco: Never Used  . Alcohol use No  . Drug use: No  . Sexual activity: Not Asked   Other Topics Concern  . None   Social History Narrative   Married (wife Robert David (Robert David) also Dr. Yong David patient) [phone]. 3 step children. 3 stepgrandchildren.       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       Hobbies: bowling, golf, exercise    Family History  Problem Relation Age of Onset  . Cancer Mother     breast  . Breast cancer Mother   . Heart disease Father     hx A-Fib, 49 with MI  . Colon cancer Father 64    metastatic to lung  . Rectal cancer Neg Hx   . Stomach cancer Neg Hx    No Known Allergies Prior to Admission medications   Medication Sig Start Date End Date Taking? Authorizing Provider  ketorolac (TORADOL) 10 MG tablet Take 1 tablet (10 mg total) by mouth every 6 (six) hours as needed for moderate pain. 02/06/16  Yes Robert Morale, MD    oxyCODONE-acetaminophen (PERCOCET) 5-325 MG tablet Take 1 tablet by mouth every 6 (six) hours as needed for severe pain. 02/12/16  Yes Robert Vasek Ephriam Jenkins, MD     Positive ROS: All other systems have been reviewed and were otherwise negative with the exception of those mentioned in the HPI and as above.  Physical Exam: General: Alert, no acute distress Cardiovascular: No pedal edema Respiratory: No cyanosis, no use of accessory musculature GI: abdomen soft Skin: No lesions in the area of chief complaint Neurologic: Sensation intact distally Psychiatric: Patient is competent for consent with normal mood and affect Lymphatic: no lymphedema  MUSCULOSKELETAL: exam stable  Assessment: RIGHT BIMALLEOLAR ANKLE FRACTURE  Plan: Plan for Procedure(s): OPEN REDUCTION INTERNAL FIXATION (ORIF) BIMALLEOLAR ANKLE FRACTURE  The risks benefits and alternatives were discussed with the patient including but not limited to the risks of nonoperative treatment, versus surgical intervention including infection, bleeding, nerve injury,  blood clots, cardiopulmonary complications, morbidity, mortality, among others, and they were willing to proceed.   Robert Roux, MD   02/17/2016 7:02 AM

## 2016-02-17 NOTE — Transfer of Care (Signed)
Immediate Anesthesia Transfer of Care Note  Patient: Robert David  Procedure(s) Performed: Procedure(s): OPEN REDUCTION INTERNAL FIXATION (ORIF) BIMALLEOLAR ANKLE FRACTURE (Right)  Patient Location: PACU  Anesthesia Type:GA combined with regional for post-op pain  Level of Consciousness: awake, alert , oriented and patient cooperative  Airway & Oxygen Therapy: Patient Spontanous Breathing and Patient connected to face mask oxygen  Post-op Assessment: Report given to RN and Post -op Vital signs reviewed and stable  Post vital signs: Reviewed and stable  Last Vitals:  Vitals:   02/17/16 0805 02/17/16 0954  BP: (!) 165/86 (!) 163/92  Pulse: 60 (!) 103  Resp: 17 16  Temp:      Last Pain:  Vitals:   02/17/16 0712  TempSrc: Oral  PainSc: 2       Patients Stated Pain Goal: 2 ([id number])  Complications: No apparent anesthesia complications

## 2016-02-17 NOTE — Anesthesia Procedure Notes (Signed)
Procedure Name: LMA Insertion Date/Time: 02/17/2016 8:21 AM Performed by: Johny Pitstick D Pre-anesthesia Checklist: Patient identified, Emergency Drugs available, Suction available and Patient being monitored Patient Re-evaluated:Patient Re-evaluated prior to inductionOxygen Delivery Method: Circle system utilized Preoxygenation: Pre-oxygenation with 100% oxygen Intubation Type: IV induction Ventilation: Mask ventilation without difficulty LMA: LMA inserted LMA Size: 4.0 Number of attempts: 1 Airway Equipment and Method: Bite block Placement Confirmation: positive ETCO2 Tube secured with: Tape Dental Injury: Teeth and Oropharynx as per pre-operative assessment

## 2016-02-17 NOTE — Anesthesia Postprocedure Evaluation (Signed)
Anesthesia Post Note  Patient: Robert David  Procedure(s) Performed: Procedure(s) (LRB): OPEN REDUCTION INTERNAL FIXATION (ORIF) BIMALLEOLAR ANKLE FRACTURE (Right)  Patient location during evaluation: PACU Anesthesia Type: General Level of consciousness: awake and alert Pain management: pain level controlled Vital Signs Assessment: post-procedure vital signs reviewed and stable Respiratory status: spontaneous breathing, nonlabored ventilation, respiratory function stable and patient connected to nasal cannula oxygen Cardiovascular status: blood pressure returned to baseline and stable Postop Assessment: no signs of nausea or vomiting Anesthetic complications: no       Last Vitals:  Vitals:   02/17/16 1015 02/17/16 1100  BP: (!) 155/85 (!) 150/84  Pulse: 88 82  Resp: 15 18  Temp:  36.6 C    Last Pain:  Vitals:   02/17/16 1100  TempSrc:   PainSc: 0-No pain                 Tiajuana Amass

## 2016-02-17 NOTE — Anesthesia Preprocedure Evaluation (Addendum)
Anesthesia Evaluation  Patient identified by MRN, date of birth, ID band Patient awake    Reviewed: Allergy & Precautions, NPO status , Patient's Chart, lab work & pertinent test results  Airway Mallampati: II  TM Distance: >3 FB Neck ROM: Full    Dental  (+) Dental Advisory Given   Pulmonary former smoker,    breath sounds clear to auscultation       Cardiovascular + dysrhythmias Atrial Fibrillation  Rhythm:Regular Rate:Normal     Neuro/Psych negative neurological ROS     GI/Hepatic Neg liver ROS, GERD  ,  Endo/Other  negative endocrine ROS  Renal/GU negative Renal ROS     Musculoskeletal   Abdominal   Peds  Hematology negative hematology ROS (+)   Anesthesia Other Findings   Reproductive/Obstetrics                            Anesthesia Physical Anesthesia Plan  ASA: II  Anesthesia Plan: General   Post-op Pain Management:  Regional for Post-op pain   Induction: Intravenous  Airway Management Planned: LMA  Additional Equipment:   Intra-op Plan:   Post-operative Plan: Extubation in OR  Informed Consent: I have reviewed the patients History and Physical, chart, labs and discussed the procedure including the risks, benefits and alternatives for the proposed anesthesia with the patient or authorized representative who has indicated his/her understanding and acceptance.   Dental advisory given  Plan Discussed with: CRNA  Anesthesia Plan Comments:         Anesthesia Quick Evaluation

## 2016-02-17 NOTE — Discharge Instructions (Signed)
° ° °  1. Keep splint clean and dry 2. Elevate foot above level of the heart 3. Take aspirin to prevent blood clots 4. Take pain meds as needed 5. Strict non weight bearing to operative extremity   Post Anesthesia Home Care Instructions  Activity: Get plenty of rest for the remainder of the day. A responsible adult should stay with you for 24 hours following the procedure.  For the next 24 hours, DO NOT: -Drive a car -Paediatric nurse -Drink alcoholic beverages -Take any medication unless instructed by your physician -Make any legal decisions or sign important papers.  Meals: Start with liquid foods such as gelatin or soup. Progress to regular foods as tolerated. Avoid greasy, spicy, heavy foods. If nausea and/or vomiting occur, drink only clear liquids until the nausea and/or vomiting subsides. Call your physician if vomiting continues.  Special Instructions/Symptoms: Your throat may feel dry or sore from the anesthesia or the breathing tube placed in your throat during surgery. If this causes discomfort, gargle with warm salt water. The discomfort should disappear within 24 hours.  If you had a scopolamine patch placed behind your ear for the management of post- operative nausea and/or vomiting:  1. The medication in the patch is effective for 72 hours, after which it should be removed.  Wrap patch in a tissue and discard in the trash. Wash hands thoroughly with soap and water. 2. You may remove the patch earlier than 72 hours if you experience unpleasant side effects which may include dry mouth, dizziness or visual disturbances. 3. Avoid touching the patch. Wash your hands with soap and water after contact with the patch.   Regional Anesthesia Blocks  1. Numbness or the inability to move the "blocked" extremity may last from 3-48 hours after placement. The length of time depends on the medication injected and your individual response to the medication. If the numbness is not going  away after 48 hours, call your surgeon.  2. The extremity that is blocked will need to be protected until the numbness is gone and the  Strength has returned. Because you cannot feel it, you will need to take extra care to avoid injury. Because it may be weak, you may have difficulty moving it or using it. You may not know what position it is in without looking at it while the block is in effect.  3. For blocks in the legs and feet, returning to weight bearing and walking needs to be done carefully. You will need to wait until the numbness is entirely gone and the strength has returned. You should be able to move your leg and foot normally before you try and bear weight or walk. You will need someone to be with you when you first try to ensure you do not fall and possibly risk injury.  4. Bruising and tenderness at the needle site are common side effects and will resolve in a few days.  5. Persistent numbness or new problems with movement should be communicated to the surgeon or the [place] [phone] [place] [phone]).

## 2016-02-18 ENCOUNTER — Encounter (HOSPITAL_BASED_OUTPATIENT_CLINIC_OR_DEPARTMENT_OTHER): Payer: Self-pay | Admitting: Orthopaedic Surgery

## 2016-02-25 ENCOUNTER — Inpatient Hospital Stay (INDEPENDENT_AMBULATORY_CARE_PROVIDER_SITE_OTHER): Payer: Self-pay | Admitting: Orthopaedic Surgery

## 2016-03-03 ENCOUNTER — Ambulatory Visit (INDEPENDENT_AMBULATORY_CARE_PROVIDER_SITE_OTHER): Payer: Self-pay | Admitting: Orthopaedic Surgery

## 2016-03-03 ENCOUNTER — Ambulatory Visit (INDEPENDENT_AMBULATORY_CARE_PROVIDER_SITE_OTHER): Payer: PRIVATE HEALTH INSURANCE

## 2016-03-03 ENCOUNTER — Encounter (INDEPENDENT_AMBULATORY_CARE_PROVIDER_SITE_OTHER): Payer: Self-pay | Admitting: Orthopaedic Surgery

## 2016-03-03 DIAGNOSIS — S82841G Displaced bimalleolar fracture of right lower leg, subsequent encounter for closed fracture with delayed healing: Secondary | ICD-10-CM | POA: Diagnosis not present

## 2016-03-03 NOTE — Progress Notes (Signed)
Patient is 2 weeks s/p ORIF lateral malleolus.  Doing well.  Ambulating with knee scooter.  Pain well controlled.  xrays show without complication.  NWB for 2 more weeks then f/u with repeat xrays of right ankle.  Anticipate advancing to WBAT in CAM at that time.  Azucena Cecil, MD [place] 6:25 PM

## 2016-03-14 ENCOUNTER — Ambulatory Visit (INDEPENDENT_AMBULATORY_CARE_PROVIDER_SITE_OTHER): Payer: PRIVATE HEALTH INSURANCE | Admitting: Family Medicine

## 2016-03-14 ENCOUNTER — Encounter: Payer: Self-pay | Admitting: Family Medicine

## 2016-03-14 VITALS — BP 136/72 | HR 70 | Temp 97.8°F | Ht 71.0 in | Wt 192.0 lb

## 2016-03-14 DIAGNOSIS — Z23 Encounter for immunization: Secondary | ICD-10-CM

## 2016-03-14 DIAGNOSIS — R739 Hyperglycemia, unspecified: Secondary | ICD-10-CM | POA: Diagnosis not present

## 2016-03-14 DIAGNOSIS — Z0000 Encounter for general adult medical examination without abnormal findings: Secondary | ICD-10-CM

## 2016-03-14 LAB — LIPID PANEL
CHOL/HDL RATIO: 5
Cholesterol: 206 mg/dL — ABNORMAL HIGH (ref 0–200)
HDL: 44.5 mg/dL (ref >39.00)
LDL Cholesterol: 141 mg/dL — ABNORMAL HIGH (ref 0–99)
NonHDL: 161.17
TRIGLYCERIDES: 101 mg/dL (ref 0.0–149.0)
VLDL: 20.2 mg/dL (ref 0.0–40.0)

## 2016-03-14 LAB — COMPREHENSIVE METABOLIC PANEL
ALT: 19 U/L (ref 0–53)
AST: 15 U/L (ref 0–37)
Albumin: 4.4 g/dL (ref 3.5–5.2)
Alkaline Phosphatase: 63 U/L (ref 39–117)
BUN: 16 mg/dL (ref 6–23)
CALCIUM: 9.5 mg/dL (ref 8.4–10.5)
CHLORIDE: 106 meq/L (ref 96–112)
CO2: 31 meq/L (ref 19–32)
Creatinine, Ser: 1.04 mg/dL (ref 0.40–1.50)
GFR: 75.65 mL/min (ref >60.00)
Glucose, Bld: 90 mg/dL (ref 70–99)
POTASSIUM: 4.4 meq/L (ref 3.5–5.1)
Sodium: 143 mEq/L (ref 135–145)
Total Bilirubin: 0.4 mg/dL (ref 0.2–1.2)
Total Protein: 6.7 g/dL (ref 6.0–8.3)

## 2016-03-14 LAB — HEMOGLOBIN A1C: HEMOGLOBIN A1C: 5.8 % (ref 4.6–6.5)

## 2016-03-14 LAB — PSA: PSA: 1.94 ng/mL (ref 0.10–4.00)

## 2016-03-14 LAB — CBC
HEMATOCRIT: 41.7 % (ref 39.0–52.0)
Hemoglobin: 13.8 g/dL (ref 13.0–17.0)
MCHC: 33 g/dL (ref 30.0–36.0)
MCV: 84.3 fl (ref 78.0–100.0)
PLATELETS: 239 10*3/uL (ref 150.0–400.0)
RBC: 4.95 Mil/uL (ref 4.22–5.81)
RDW: 14.8 % (ref 11.5–15.5)
WBC: 6 10*3/uL (ref 4.0–10.5)

## 2016-03-14 MED ORDER — DILTIAZEM HCL ER 120 MG PO CP24
120.0000 mg | ORAL_CAPSULE | Freq: Every day | ORAL | 0 refills | Status: DC
Start: 2016-03-14 — End: 2016-09-19

## 2016-03-14 NOTE — Progress Notes (Signed)
Pre visit review using our clinic review tool, if applicable. No additional management support is needed unless otherwise documented below in the visit note. 

## 2016-03-14 NOTE — Patient Instructions (Addendum)
Need to get the weight back down when the leg is in better shape and the stress is lower Wt Readings from Last 3 Encounters:  03/14/16 192 lb (87.1 kg)  02/17/16 190 lb (86.2 kg)  03/09/15 184 lb (83.5 kg)   Stop by lab before you leave  No other changes today  Declined anything beyond aspirin. We refilled diltiazem
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
## 2016-03-17 ENCOUNTER — Ambulatory Visit (INDEPENDENT_AMBULATORY_CARE_PROVIDER_SITE_OTHER): Payer: Self-pay | Admitting: Orthopaedic Surgery

## 2016-03-17 ENCOUNTER — Ambulatory Visit (INDEPENDENT_AMBULATORY_CARE_PROVIDER_SITE_OTHER): Payer: PRIVATE HEALTH INSURANCE

## 2016-03-17 DIAGNOSIS — S82841G Displaced bimalleolar fracture of right lower leg, subsequent encounter for closed fracture with delayed healing: Secondary | ICD-10-CM

## 2016-03-17 NOTE — Progress Notes (Signed)
Patient is 4 weeks status post operative fixation of bimalleolar ankle fracture. He is doing well. He has some pain. The exam of his ankle is benign. No signs of infection. 3 view x-rays of the right ankle shows stable fixation and healing fracture with no complications. At this point and allow him to weight-bear in the cam boot. We'll put him in physical therapy for gait training and balance and strengthening. I'll see him back in 4 weeks with repeat 3 view x-rays of the right ankle.

## 2016-04-14 ENCOUNTER — Encounter (INDEPENDENT_AMBULATORY_CARE_PROVIDER_SITE_OTHER): Payer: Self-pay | Admitting: Orthopaedic Surgery

## 2016-04-14 ENCOUNTER — Ambulatory Visit (INDEPENDENT_AMBULATORY_CARE_PROVIDER_SITE_OTHER): Payer: PRIVATE HEALTH INSURANCE

## 2016-04-14 ENCOUNTER — Ambulatory Visit (INDEPENDENT_AMBULATORY_CARE_PROVIDER_SITE_OTHER): Payer: Self-pay | Admitting: Orthopaedic Surgery

## 2016-04-14 DIAGNOSIS — S82841G Displaced bimalleolar fracture of right lower leg, subsequent encounter for closed fracture with delayed healing: Secondary | ICD-10-CM | POA: Diagnosis not present

## 2016-04-14 NOTE — Progress Notes (Signed)
Patient is 8 weeks status post operative fixation of his ankle fracture. He is doing well. He has progressed with physical therapy.  He's been breaching. He is walking with a regular shoe. He has minimal pain. Surgical scar is well-healed. 3 view x-rays of the right ankle shows a healed fibula fracture without complication. At this point release him to activity as tolerated. He may discontinue therapy and begin home exercises if he desires or if physical therapy is still helping I'll recommend formal weeks of physical therapy at once a week.

## 2016-04-19 ENCOUNTER — Telehealth: Payer: Self-pay

## 2016-04-19 NOTE — Telephone Encounter (Signed)
Robert David called me back today and wanted a update on what we were planning to do about the "excess charge"  for the boot he received.   I let him know I have asked for this to be reviewed by Pt exp  and I am waiting to find out the results.  Please let me know or call him at [phone]

## 2016-05-03 ENCOUNTER — Telehealth (INDEPENDENT_AMBULATORY_CARE_PROVIDER_SITE_OTHER): Payer: Self-pay

## 2016-05-03 NOTE — Telephone Encounter (Signed)
Mr Benavides returned the unwanted boot.  I will e-mail Sharyn Lull today to issue credit and re-file the claim.

## 2016-09-19 ENCOUNTER — Encounter: Payer: Self-pay | Admitting: Family Medicine

## 2016-09-19 ENCOUNTER — Ambulatory Visit (INDEPENDENT_AMBULATORY_CARE_PROVIDER_SITE_OTHER): Payer: PRIVATE HEALTH INSURANCE | Admitting: Family Medicine

## 2016-09-19 VITALS — BP 130/78 | HR 65 | Temp 98.3°F | Ht 71.0 in | Wt 212.4 lb

## 2016-09-19 DIAGNOSIS — M7041 Prepatellar bursitis, right knee: Secondary | ICD-10-CM

## 2016-09-19 NOTE — Patient Instructions (Signed)
Ice 20 minutes 3-4x a day for the next week  Should improve within the next 3-4 weeks. Should not be getting worse, warmer, more painful at this point- if it does let us either see you or refer you to sports medicine Dr. Paulla Fore of sports medicine at the [facility]  ______________________________________________________________________  Starting October 1st 2018, I will be transferring to our new location: [facility] (corner of [address] [place] and [address] [employer]) [place], [place] Phone: [phone]  I would love to have you remain my patient at this new location as long as it remains convenient for you. I am excited about the opportunity to have x-ray and sports medicine in the new building but will really miss the awesome staff and physicians at [facility]. Continue to schedule appointments at [facility] and we will automatically transfer them to the [facility] location starting October 1st.   ______________________________________________________________________    Prepatellar Bursitis Prepatellar bursitis is inflammation of the prepatellar bursa. The prepatellar bursa is a fluid-filled sac that cushions the kneecap (patella). Prepatellar bursitis happens when fluid builds up in the this sac and causes it to get larger. The condition causes knee pain. What are the causes? This condition may be caused by:  Constant pressure on the knees from kneeling.  A hit to the knee.  Falling on the knee.  A bacterial infection.  Moving the knee often in a forceful way.  What increases the risk? This condition is more likely to develop in:  People who play a sport that involves a risk for falls on the knee or hard hits (blows) to the knee. These sports include: ? Football. ? Wrestling. ? Basketball. ? Soccer.  People who have to kneel for long periods of time, such as roofers, plumbers,  and gardeners.  People with another inflammatory condition, such as gout or rheumatoid arthritis.  What are the signs or symptoms? The most common symptom of this condition is knee pain that gets better with rest. Other symptoms include:  Swelling on the front of the kneecap.  Warmth in the knee.  Tenderness with activity.  Redness in the knee.  Inability to bend the knee or to kneel.  How is this diagnosed? This condition may be diagnosed based on:  Your symptoms.  Your medical history.  A physical exam. During the exam, your provider will compare your knees and check for tenderness and pain when moving your knee. Your health care provider may also use a needle to remove fluid from the bursa to help diagnose an infection.  Tests, such as: ? A blood test that checks for infection. ? X-rays. These may be taken to check the structure of the patella. ? MRI or ultrasound. These may be done to check for swelling and fluid buildup in the bursa.  How is this treated? This condition may be treated by:  Resting the knee.  Applying ice to the knee.  Medicine, such as: ? Nonsteroidal anti-inflammatory drugs (NSAIDs). These can help to reduce pain and swelling. ? Antibiotic medicines. These may be needed if you have an infection. ? Steroid medicines. These may be prescribed if other treatments are not helping.  Raising (elevating) the knee while resting.  Doing strengthening and stretching exercises (physical therapy). These may be recommended after pain and swelling improve.  Having a procedure to remove fluid from the bursa. This may be done if other treatment  is not helping.  Having surgery to remove the bursa. This may be done if you have a severe infection or if the condition keeps coming back after treatment.  Follow these instructions at home: Medicines  Take over-the-counter and prescription medicines only as told by your health care provider.  If you were  prescribed an antibiotic medicine, take it as told by your health care provider. Do not stop taking the antibiotic even if you start to feel better. Managing pain, stiffness, and swelling  If directed, apply ice to your knee. ? Put ice in a plastic bag. ? Place a towel between your skin and the bag. ? Leave the ice on for 20 minutes, 2-3 times a day.  Elevate your knee above the level of your heart while you are sitting or lying down. Driving  Do not drive or operate heavy machinery while taking prescription pain medicine.  Ask your health care provider when it is safe fpr you to drive. Activity  Rest your knee.  Avoid activities that cause pain.  Return to your normal activities as told by your health care provider. Ask your health care provider what activities are safe for you.  Do exercises as told by your health care provider. General instructions  Do not use the injured limb to support your body weight until your health care provider says that you can.  Do not use any tobacco products, such as cigarettes, chewing tobacco, and e-cigarettes. Tobacco can delay bone healing. If you need help quitting, ask your health care provider.  Keep all follow-up visits as told by your health care provider. This is important. How is this prevented?  Warm up and stretch before being active.  Cool down and stretch after being active.  Give your body time to rest between periods of activity.  Make sure to use equipment that fits you.  Be safe and responsible while being active to avoid falls.  Do at least 150 minutes of moderate-intensity exercise each week, such as brisk walking or water aerobics.  Maintain physical fitness, including: ? Strength. ? Flexibility. ? Cardiovascular fitness. ? Endurance. Contact a health care provider if:  Your symptoms do not improve.  Your symptoms get worse.  Your symptoms keep coming back after treatment.  You develop a fever and have  warmth, redness, and swelling over your knee. This information is not intended to replace advice given to you by your health care provider. Make sure you discuss any questions you have with your health care provider. Document Released: 01/24/2005 Document Revised: 09/29/2015 Document Reviewed: 10/24/2014 Elsevier Interactive Patient Education  [employer].

## 2016-09-19 NOTE — Progress Notes (Signed)
Subjective:  Robert David is a 66 y.o. year old very pleasant male patient who presents for/with See problem oriented charting ROS- complains of right knee swelling, mild warmth, mild pain. No fever or chills. No cuts/scrapes near the knee   Past Medical History-  Patient Active Problem List   Diagnosis Date Noted  . Atrial fibrillation ([place]) 05/26/2009    Priority: High  . Hyperglycemia 09/01/2014    Priority: Medium  . Elevated blood pressure (not hypertension) 09/01/2014    Priority: Medium  . Insomnia 02/24/2014    Priority: Medium  . Diastolic dysfunction 93/71/6967    Priority: Medium  . Hyperlipidemia 05/11/2007    Priority: Medium  . Contact dermatitis 09/01/2014    Priority: Low  . Former smoker 02/24/2014    Priority: Low  . Chest pain 07/18/2012    Priority: Low  . GERD (gastroesophageal reflux disease) 07/18/2012    Priority: Low  . Displaced bimalleolar fracture of right lower leg, subsequent encounter for closed fracture with delayed healing 02/12/2016    Medications- reviewed and updated No current outpatient prescriptions on file.   No current facility-administered medications for this visit.     Objective: BP 130/78 (BP Location: Left Arm, Patient Position: Sitting, Cuff Size: Large)   Pulse 65   Temp 98.3 F (36.8 C) (Oral)   Ht 5\' 11"  (1.803 m)   Wt 212 lb 6.4 oz (96.3 kg)   SpO2 96%   BMI 29.62 kg/m  Gen: NAD, resting comfortably CV: RRR no murmurs rubs or gallops Lungs: CTAB no crackles, wheeze, rhonchi Ext: no pretibial edema Skin: warm, dry, no rash- warmth over knee and mild erythema  Right Knee: Normal to inspection outside of swelling/edema over patella with mild warmth, erythema. No effusion in knee joint itself.  Palpation normal with no warmth or joint line tenderness or patellar tenderness (other than slight pain along upper lateral edge0 or condyle tenderness. ROM normal in flexion and extension and lower leg rotation. Ligaments  with solid consistent endpoints including ACL, PCL, LCL, MCL. Patellar and quadriceps tendons unremarkable. Hamstring and quadriceps strength is normal.  Left knee normal  Assessment/Plan:  Prepatellar bursitis of right knee S:  Patient complains of right knee swelling over the patella. Imaged 09/07/2012 and noted moderate suprapatellar joint effusion but no fracture. Joint spaces were normal at that time. He states this resolved completely  About a week ago noted swelling above his patella. Very minimal tenderness except mid ache with palpation if pushes on lateral upper edge of patella. Denies fall or injury or trauma. Normal movement of leg- has noted that it is slightly red and warm over the patella- visibly swollen compared to left. No gout history.  A/P: Prepatellar bursitis that is not particularly tender or erythematous. Doubt infection. Discussed likely would resolve on its own- can use rest, elevation, compression as per AVS. We discussed could trial nsaids or steroids (Though would increase CBGs and DM risk potentially) and he declines for now.  Patient Instructions  Ice 20 minutes 3-4x a day for the next week  Should improve within the next 3-4 weeks. Should not be getting worse, warmer, more painful at this point- if it does let us either see you or refer you to sports medicine Dr. Paulla Fore of sports medicine at the [facility]  Return precautions advised.  Garret Reddish, MD

## 2017-03-16 ENCOUNTER — Other Ambulatory Visit: Payer: Self-pay | Admitting: Family Medicine

## 2017-03-21 ENCOUNTER — Encounter: Payer: Self-pay | Admitting: Family Medicine

## 2017-03-21 ENCOUNTER — Ambulatory Visit: Payer: PRIVATE HEALTH INSURANCE | Admitting: Family Medicine

## 2017-03-21 VITALS — BP 128/84 | HR 84 | Temp 98.5°F | Ht 71.0 in | Wt 196.6 lb

## 2017-03-21 DIAGNOSIS — I480 Paroxysmal atrial fibrillation: Secondary | ICD-10-CM

## 2017-03-21 NOTE — Progress Notes (Signed)
Subjective:  Robert David is a 67 y.o. year old very pleasant male patient who presents for/with See problem oriented charting ROS- has had palpitations but no chest pain or shortness of breath or significant dizziness or imbalance   Past Medical History-  Patient Active Problem List   Diagnosis Date Noted  . Atrial fibrillation ([place]) 05/26/2009    Priority: High  . Hyperglycemia 09/01/2014    Priority: Medium  . Elevated blood pressure (not hypertension) 09/01/2014    Priority: Medium  . Insomnia 02/24/2014    Priority: Medium  . Diastolic dysfunction 78/29/5621    Priority: Medium  . Hyperlipidemia 05/11/2007    Priority: Medium  . Contact dermatitis 09/01/2014    Priority: Low  . Former smoker 02/24/2014    Priority: Low  . Chest pain 07/18/2012    Priority: Low  . GERD (gastroesophageal reflux disease) 07/18/2012    Priority: Low  . Displaced bimalleolar fracture of right lower leg, subsequent encounter for closed fracture with delayed healing 02/12/2016    Medications- reviewed and updated Current Outpatient Medications  Medication Sig Dispense Refill  . aspirin EC 81 MG tablet Take 81 mg by mouth daily.    [name] Kitchen DILT-XR 120 MG 24 hr capsule TAKE 1 CAPSULE(120 MG) BY MOUTH DAILY 30 capsule 0   No current facility-administered medications for this visit.     Objective: BP 128/84 (BP Location: Left Arm, Patient Position: Sitting, Cuff Size: Large)   Pulse 84   Temp 98.5 F (36.9 C) (Oral)   Ht 5\' 11"  (1.803 m)   Wt 196 lb 9.6 oz (89.2 kg)   SpO2 97%   BMI 27.42 kg/m  Gen: NAD, resting comfortably CV: irregularly irregular but regular rate no murmurs rubs or gallops Lungs: CTAB no crackles, wheeze, rhonchi Abdomen: soft/nontender/nondistended/normal bowel sounds.  Ext: no edema Skin: warm, dry  Assessment/Plan:  Other notes 1. down 16 bs since January- had gained prior with ankle fracture Wt Readings from Last 3 Encounters:  03/21/17 196 lb 9.6 oz (89.2 kg)   09/19/16 212 lb 6.4 oz (96.3 kg)  03/14/16 192 lb (87.1 kg)   Atrial fibrillation S:  Patient with atrial fibrillation at least since 05/14/09 when he was hospitalized with a fib with RVR. He presented with palpitations at that time. Apparently had episode 25 years prior as well. Since that time patient has preferred to take diltiazem only prn if has palpitations and aspirin mainly only around those times- though I have discussed my preference for regularly taking both since I started caring for him in 2016  Recently, he has noted some palpitations/fluttering in his chest.  has noted flutter in chest about 1.5 weeks ago and continues to occur intermittently but even when not occurring notes an irregular heart beat if checks his pulse. Started taking diltiazem everyday- had been just taking this as needed if felt he went into atrial fibrillation (he claims he can tell each time) but this time did not normalize rate or rhythm within either that day or within a few days. HR was running around low 100s - now back to 70, 80, 90 with more consistency on diltiazem  Has been drinking a lot more coffee- trying to cut to decaf. Also took an energy drink. Wonders if these were triggers. Flutter in chest is concerning to him.   A/P: Patient is concerned by more persistent perceived atrial fibrillation. Discussed doing EKG- he declines-  Obviously we cannot formally confirm a fib but I do  strongly suspect this based on exam. We discussed taking diltiazem and aspirin consistently- he agrees to this at least until he sees cardiology. CHadsvasc is only a 1 though we did discuss could also use medicine like xarelto or coumadin- he declines for now. We discussed cardiology clinic and he would like to sit down to discuss options with them so I referred him today  Lab/Order associations: Paroxysmal atrial fibrillation ([place]) - Plan: Ambulatory referral to Cardiology  Time Stamp The duration of face-to-face time during  this visit was greater than 15 minutes. Greater than 50% of this time was spent in counseling, explanation of diagnosis, planning of further management, and/or coordination of care including primarily risks of atrial fibrillation including stroke and RVR and how we can lower those risks, also discussing treatment/follow up plan with cardiology as end plan.   Return precautions advised.  Garret Reddish, MD

## 2017-03-21 NOTE — Patient Instructions (Addendum)
We will call you within a week or two about your referral to cardiology. If you do not hear within 3 weeks, give [place] a call.   No more energy drinks. I like cutting down on level of caffeine.   Schedule a physical for 3-4 months from now

## 2017-03-21 NOTE — Assessment & Plan Note (Signed)
S:  Patient with atrial fibrillation at least since 05/14/09 when he was hospitalized with a fib with RVR. He presented with palpitations at that time. Apparently had episode 25 years prior as well. Since that time patient has preferred to take diltiazem only prn if has palpitations and aspirin mainly only around those times- though I have discussed my preference for regularly taking both since I started caring for him in 2016  Recently, he has noted some palpitations/fluttering in his chest.  has noted flutter in chest about 1.5 weeks ago and continues to occur intermittently but even when not occurring notes an irregular heart beat if checks his pulse. Started taking diltiazem everyday- had been just taking this as needed if felt he went into atrial fibrillation (he claims he can tell each time) but this time did not normalize rate or rhythm within either that day or within a few days. HR was running around low 100s - now back to 70, 80, 90 with more consistency on diltiazem  Has been drinking a lot more coffee- trying to cut to decaf. Also took an energy drink. Wonders if these were triggers. Flutter in chest is concerning to him.   A/P: Patient is concerned by more persistent perceived atrial fibrillation. Discussed doing EKG- he declines-  Obviously we cannot formally confirm a fib but I do strongly suspect this based on exam. We discussed taking diltiazem and aspirin consistently- he agrees to this at least until he sees cardiology. CHadsvasc is only a 1 though we did discuss could also use medicine like xarelto or coumadin- he declines for now. We discussed cardiology clinic and he would like to sit down to discuss options with them so I referred him today

## 2017-03-23 ENCOUNTER — Telehealth (HOSPITAL_COMMUNITY): Payer: Self-pay | Admitting: *Deleted

## 2017-03-23 NOTE — Telephone Encounter (Signed)
I called pt to offer appt to afib clinic.  He stated he would call us back when he has his calendar in front of him to schedule

## 2017-03-28 ENCOUNTER — Ambulatory Visit (HOSPITAL_COMMUNITY)
Admission: RE | Admit: 2017-03-28 | Discharge: 2017-03-28 | Disposition: A | Discharge status: Home / Self Care | Payer: PRIVATE HEALTH INSURANCE | Source: Ambulatory Visit | Attending: Nurse Practitioner | Admitting: Nurse Practitioner

## 2017-03-28 ENCOUNTER — Encounter (HOSPITAL_COMMUNITY): Payer: Self-pay | Admitting: Nurse Practitioner

## 2017-03-28 VITALS — BP 126/78 | HR 74 | Ht 71.0 in | Wt 199.4 lb

## 2017-03-28 DIAGNOSIS — R001 Bradycardia, unspecified: Secondary | ICD-10-CM | POA: Insufficient documentation

## 2017-03-28 DIAGNOSIS — Z87891 Personal history of nicotine dependence: Secondary | ICD-10-CM | POA: Insufficient documentation

## 2017-03-28 DIAGNOSIS — I481 Persistent atrial fibrillation: Secondary | ICD-10-CM | POA: Diagnosis not present

## 2017-03-28 DIAGNOSIS — I4891 Unspecified atrial fibrillation: Secondary | ICD-10-CM | POA: Insufficient documentation

## 2017-03-28 DIAGNOSIS — I4819 Other persistent atrial fibrillation: Secondary | ICD-10-CM

## 2017-03-28 DIAGNOSIS — E785 Hyperlipidemia, unspecified: Secondary | ICD-10-CM | POA: Insufficient documentation

## 2017-03-28 MED ORDER — APIXABAN 5 MG PO TABS: 5.0000 mg | ORAL_TABLET | Freq: Two times a day (BID) | ORAL | 6 refills | Status: DC

## 2017-03-28 MED ORDER — DILTIAZEM HCL ER 120 MG PO CP24: ORAL_CAPSULE | ORAL | 3 refills | Status: DC

## 2017-03-28 MED ORDER — APIXABAN 5 MG PO TABS: 5.0000 mg | ORAL_TABLET | Freq: Two times a day (BID) | ORAL | 0 refills | Status: DC

## 2017-03-28 NOTE — Patient Instructions (Signed)
Your physician has recommended you make the following change in your medication:  1)Stop Aspirin 2)Start Eliquis 5mg  twice a day

## 2017-03-28 NOTE — Progress Notes (Signed)
Primary Care Physician: Robert Olp, MD Referring Physician: [name] is a 67 y.o. male with a h/o paroxysmal afib that goes back 25 years. It has been very quiet. He has noted irregular heart beat for several weeks. His daily meds are 81 mg ASA and 120 mg diltiazem a day, which he did take irregularly but has been taking on a daily basis for a couple of weeks. Chadsvasc score is 1 for age. He works full time as a Theme park manager. He has been on a diet since January and has lost 16 lbs.He exercises on a regular basis. He does not smoke. Has been known to drink a energy drink in the past but has stopped this. He is trying to limit his caffeine. He does snore some, but wife states that no apnea witnessed and snoring has improved with weight loss. No alcohol. He is well rate controlled and does not appear to be overly symptomatic in afib. Last Echo 2011 which showed mild to moderate left atrial enlargement.   Today, he denies symptoms of  chest pain, shortness of breath, orthopnea, PND, lower extremity edema, dizziness, presyncope, syncope, or neurologic sequela. The patient is tolerating medications without difficulties and is otherwise without complaint today.   Past Medical History:  Diagnosis Date  . Allergy    seasonal  . Atrial fibrillation ([place]) 05/26/2009   under control  . Headache(784.0) 03/23/2009  . HYPERLIPIDEMIA 05/11/2007   Past Surgical History:  Procedure Laterality Date  . COLONOSCOPY    . LEFT HEART CATHETERIZATION WITH CORONARY ANGIOGRAM N/A 07/19/2012   Procedure: LEFT HEART CATHETERIZATION WITH CORONARY ANGIOGRAM;  Surgeon: Robert Breeding, MD;  Location: [facility] CATH LAB;  Service: Cardiovascular;  Laterality: N/A;  . ORIF ANKLE FRACTURE Right 02/17/2016   Procedure: OPEN REDUCTION INTERNAL FIXATION (ORIF) BIMALLEOLAR ANKLE FRACTURE;  Surgeon: Robert Koyanagi, MD;  Location: [facility];  Service: Orthopedics;  Laterality: Right;  . POLYPECTOMY       Current Outpatient Medications  Medication Sig Dispense Refill  . diltiazem (DILT-XR) 120 MG 24 hr capsule TAKE 1 CAPSULE(120 MG) BY MOUTH DAILY 30 capsule 3  . apixaban (ELIQUIS) 5 MG TABS tablet Take 1 tablet (5 mg total) by mouth 2 (two) times daily. 60 tablet 6   No current facility-administered medications for this encounter.     No Known Allergies  Social History   Socioeconomic History  . Marital status: Married    Spouse name: Not on file  . Number of children: Not on file  . Years of education: Not on file  . Highest education level: Not on file  Social Needs  . Financial resource strain: Not on file  . Food insecurity - worry: Not on file  . Food insecurity - inability: Not on file  . Transportation needs - medical: Not on file  . Transportation needs - non-medical: Not on file  Occupational History  . Not on file  Tobacco Use  . Smoking status: Former Smoker    Packs/day: 1.00    Years: 10.00    Pack years: 10.00    Types: Cigarettes    Last attempt to quit: 10/24/1988    Years since quitting: 28.4  . Smokeless tobacco: Never Used  Substance and Sexual Activity  . Alcohol use: No    Alcohol/week: 0.0 oz  . Drug use: No  . Sexual activity: Not on file  Other Topics Concern  . Not on file  Social History  Narrative   Married (wife Robert David (bobby gail) also Dr. Yong David patient) [phone]. 3 step children. 3 stepgrandchildren.       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       Hobbies: bowling, golf, exercise     Family History  Problem Relation Age of Onset  . Cancer Mother        breast  . Breast cancer Mother   . Heart disease Father        hx A-Fib, 6 with MI  . Colon cancer Father 53       metastatic to lung  . Rectal cancer Neg Hx   . Stomach cancer Neg Hx     ROS- All systems are reviewed and negative except as per the HPI above  Physical Exam: Vitals:   03/28/17 1018  BP: 126/78  Pulse: 74  Weight: 199 lb 6.4 oz (90.4 kg)   Height: 5\' 11"  (1.803 m)   Wt Readings from Last 3 Encounters:  03/28/17 199 lb 6.4 oz (90.4 kg)  03/21/17 196 lb 9.6 oz (89.2 kg)  09/19/16 212 lb 6.4 oz (96.3 kg)    Labs: Lab Results  Component Value Date   NA 143 03/14/2016   K 4.4 03/14/2016   CL 106 03/14/2016   CO2 31 03/14/2016   GLUCOSE 90 03/14/2016   BUN 16 03/14/2016   CREATININE 1.04 03/14/2016   CALCIUM 9.5 03/14/2016   Lab Results  Component Value Date   INR 1.10 07/19/2012   Lab Results  Component Value Date   CHOL 206 (H) 03/14/2016   HDL 44.50 03/14/2016   LDLCALC 141 (H) 03/14/2016   TRIG 101.0 03/14/2016     GEN- The patient is well appearing, alert and oriented x 3 today.   Head- normocephalic, atraumatic Eyes-  Sclera clear, conjunctiva pink Ears- hearing intact Oropharynx- clear Neck- supple, no JVP Lymph- no cervical lymphadenopathy Lungs- Clear to ausculation bilaterally, normal work of breathing Heart- Regular rate and rhythm, no murmurs, rubs or gallops, PMI not laterally displaced GI- soft, NT, ND, + BS Extremities- no clubbing, cyanosis, or edema MS- no significant deformity or atrophy Skin- no rash or lesion Psych- euthymic mood, full affect Neuro- strength and sensation are intact  EKG-afib at 74 bpm, qrs int 88 ms, qtc 424 ms Epic records reviewe Echo-2011-Study Conclusions  - Left ventricle: The cavity size was normal. Systolic function was  normal. The estimated ejection fraction was in the range of 60% to  65%. Wall motion was normal; there were no regional wall motion  abnormalities. Features are consistent with a pseudonormal left  ventricular filling pattern, with concomitant abnormal relaxation  and increased filling pressure (grade 2 diastolic dysfunction). - Left atrium: The atrium was mildly to moderately dilated.    Assessment and Plan: 1. Persisitent afib Started as paroxysmal many years ago and has been quiet Persisitent for at least the last  3 weeks Tolerating well General education re afib and triggers   No obvious lifestyle issues to address,pt has addressed weight loss/exercise and caffeine use already Continue Cardizem 120 mg daily with good rate control Update echo  2. Chadsvasc score of 1 Explained to pt since he has been persistent for several weeks, will have to try to cardiovert pt to get back in SR and that will meant starting on anticoagulation  Discussed xarelro, eliquis and warfarin Pt would like to start Eliquis 5 mg bid Stop asa Denies a bleeding risk/history Bleeding precautions discussed  Once pt is on DOAC x 3 weeks, will purse cardioversion, asked not to miss any doses between now and then  [facility] Robert David, [facility] [address] [place], [place] [address] [phone]

## 2017-04-03 ENCOUNTER — Ambulatory Visit (HOSPITAL_COMMUNITY)
Admission: RE | Admit: 2017-04-03 | Discharge: 2017-04-03 | Disposition: A | Discharge status: Home / Self Care | Payer: PRIVATE HEALTH INSURANCE | Source: Ambulatory Visit | Attending: Nurse Practitioner | Admitting: Nurse Practitioner

## 2017-04-03 DIAGNOSIS — I481 Persistent atrial fibrillation: Secondary | ICD-10-CM | POA: Insufficient documentation

## 2017-04-03 DIAGNOSIS — I4819 Other persistent atrial fibrillation: Secondary | ICD-10-CM

## 2017-04-03 DIAGNOSIS — E785 Hyperlipidemia, unspecified: Secondary | ICD-10-CM | POA: Insufficient documentation

## 2017-04-03 DIAGNOSIS — Z87891 Personal history of nicotine dependence: Secondary | ICD-10-CM | POA: Insufficient documentation

## 2017-04-03 NOTE — Progress Notes (Signed)
  Echocardiogram 2D Echocardiogram has been performed.  Jannett Celestine 04/03/2017, 9:56 AM

## 2017-04-10 ENCOUNTER — Encounter (HOSPITAL_COMMUNITY): Payer: Self-pay | Admitting: Nurse Practitioner

## 2017-04-10 ENCOUNTER — Ambulatory Visit (HOSPITAL_COMMUNITY)
Admission: RE | Admit: 2017-04-10 | Discharge: 2017-04-10 | Disposition: A | Discharge status: Home / Self Care | Payer: PRIVATE HEALTH INSURANCE | Source: Ambulatory Visit | Attending: Nurse Practitioner | Admitting: Nurse Practitioner

## 2017-04-10 VITALS — BP 148/82 | HR 79 | Ht 71.0 in | Wt 199.0 lb

## 2017-04-10 DIAGNOSIS — Z7901 Long term (current) use of anticoagulants: Secondary | ICD-10-CM | POA: Insufficient documentation

## 2017-04-10 DIAGNOSIS — Z800 Family history of malignant neoplasm of digestive organs: Secondary | ICD-10-CM | POA: Diagnosis not present

## 2017-04-10 DIAGNOSIS — E785 Hyperlipidemia, unspecified: Secondary | ICD-10-CM | POA: Diagnosis not present

## 2017-04-10 DIAGNOSIS — I481 Persistent atrial fibrillation: Secondary | ICD-10-CM | POA: Diagnosis not present

## 2017-04-10 DIAGNOSIS — Z803 Family history of malignant neoplasm of breast: Secondary | ICD-10-CM | POA: Insufficient documentation

## 2017-04-10 DIAGNOSIS — Z8249 Family history of ischemic heart disease and other diseases of the circulatory system: Secondary | ICD-10-CM | POA: Insufficient documentation

## 2017-04-10 DIAGNOSIS — I4819 Other persistent atrial fibrillation: Secondary | ICD-10-CM

## 2017-04-10 DIAGNOSIS — Z79899 Other long term (current) drug therapy: Secondary | ICD-10-CM | POA: Insufficient documentation

## 2017-04-10 DIAGNOSIS — I480 Paroxysmal atrial fibrillation: Secondary | ICD-10-CM | POA: Diagnosis present

## 2017-04-10 DIAGNOSIS — Z87891 Personal history of nicotine dependence: Secondary | ICD-10-CM | POA: Insufficient documentation

## 2017-04-10 NOTE — Progress Notes (Signed)
Primary Care Physician: Marin Olp, MD Referring Physician: Dr. Kyra Leyland is a 67 y.o. male with a h/o paroxysmal afib that goes back 25 years. It has been very quiet. He has noted irregular heart beat for several weeks. His daily meds are 81 mg ASA and 120 mg diltiazem a day, which he did take irregularly but has been taking on a daily basis for a couple of weeks. Chadsvasc score is 1 for age. He works full time as a Theme park manager. He has been on a diet since January and has lost 16 lbs.He exercises on a regular basis. He does not smoke. Has been known to drink a energy drink in the past but has stopped this. He is trying to limit his caffeine. He does snore some, but wife states that no apnea witnessed and snoring has improved with weight loss. No alcohol. He is well rate controlled and does not appear to be overly symptomatic in afib. Last Echo 2011 which showed mild to moderate left atrial enlargement.   F/u in afib clinic, 3/4, he has now been on anticoagualtion as of 2 weeks 3/6. He remains in rate controlled afib. She states that he is not symptomatic and would prefer to scheduled a cardioversion when he returns from [place] to see his daughter, who is ill,, early April. Echo recently updated and wnl.  Today, he denies symptoms of  chest pain, shortness of breath, orthopnea, PND, lower extremity edema, dizziness, presyncope, syncope, or neurologic sequela. The patient is tolerating medications without difficulties and is otherwise without complaint today.   Past Medical History:  Diagnosis Date  . Allergy    seasonal  . Atrial fibrillation ([place]) 05/26/2009   under control  . Headache(784.0) 03/23/2009  . HYPERLIPIDEMIA 05/11/2007   Past Surgical History:  Procedure Laterality Date  . COLONOSCOPY    . LEFT HEART CATHETERIZATION WITH CORONARY ANGIOGRAM N/A 07/19/2012   Procedure: LEFT HEART CATHETERIZATION WITH CORONARY ANGIOGRAM;  Surgeon: Minus Breeding, MD;  Location: [facility] CATH  LAB;  Service: Cardiovascular;  Laterality: N/A;  . ORIF ANKLE FRACTURE Right 02/17/2016   Procedure: OPEN REDUCTION INTERNAL FIXATION (ORIF) BIMALLEOLAR ANKLE FRACTURE;  Surgeon: Leandrew Koyanagi, MD;  Location: [place];  Service: Orthopedics;  Laterality: Right;  . POLYPECTOMY      Current Outpatient Medications  Medication Sig Dispense Refill  . apixaban (ELIQUIS) 5 MG TABS tablet Take 1 tablet (5 mg total) by mouth 2 (two) times daily. 60 tablet 6  . diltiazem (DILT-XR) 120 MG 24 hr capsule TAKE 1 CAPSULE(120 MG) BY MOUTH DAILY 30 capsule 3   No current facility-administered medications for this encounter.     No Known Allergies  Social History   Socioeconomic History  . Marital status: Married    Spouse name: Not on file  . Number of children: Not on file  . Years of education: Not on file  . Highest education level: Not on file  Social Needs  . Financial resource strain: Not on file  . Food insecurity - worry: Not on file  . Food insecurity - inability: Not on file  . Transportation needs - medical: Not on file  . Transportation needs - non-medical: Not on file  Occupational History  . Not on file  Tobacco Use  . Smoking status: Former Smoker    Packs/day: 1.00    Years: 10.00    Pack years: 10.00    Types: Cigarettes    Last attempt  to quit: 10/24/1988    Years since quitting: 28.4  . Smokeless tobacco: Never Used  Substance and Sexual Activity  . Alcohol use: No    Alcohol/week: 0.0 oz  . Drug use: No  . Sexual activity: Not on file  Other Topics Concern  . Not on file  Social History Narrative   Married (wife [name] (bobby gail) also Dr. Yong Channel patient) [phone]. 3 step children. 3 stepgrandchildren.       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       Hobbies: bowling, golf, exercise     Family History  Problem Relation Age of Onset  . Cancer Mother        breast  . Breast cancer Mother   . Heart disease Father        hx A-Fib,  81 with MI  . Colon cancer Father 46       metastatic to lung  . Rectal cancer Neg Hx   . Stomach cancer Neg Hx     ROS- All systems are reviewed and negative except as per the HPI above  Physical Exam: Vitals:   04/10/17 0956  BP: (!) 148/82  Pulse: 79  Weight: 199 lb (90.3 kg)  Height: 5\' 11"  (1.803 m)   Wt Readings from Last 3 Encounters:  04/10/17 199 lb (90.3 kg)  03/28/17 199 lb 6.4 oz (90.4 kg)  03/21/17 196 lb 9.6 oz (89.2 kg)    Labs: Lab Results  Component Value Date   NA 143 03/14/2016   K 4.4 03/14/2016   CL 106 03/14/2016   CO2 31 03/14/2016   GLUCOSE 90 03/14/2016   BUN 16 03/14/2016   CREATININE 1.04 03/14/2016   CALCIUM 9.5 03/14/2016   Lab Results  Component Value Date   INR 1.10 07/19/2012   Lab Results  Component Value Date   CHOL 206 (H) 03/14/2016   HDL 44.50 03/14/2016   LDLCALC 141 (H) 03/14/2016   TRIG 101.0 03/14/2016     GEN- The patient is well appearing, alert and oriented x 3 today.   Head- normocephalic, atraumatic Eyes-  Sclera clear, conjunctiva pink Ears- hearing intact Oropharynx- clear Neck- supple, no JVP Lymph- no cervical lymphadenopathy Lungs- Clear to ausculation bilaterally, normal work of breathing Heart- Regular rate and rhythm, no murmurs, rubs or gallops, PMI not laterally displaced GI- soft, NT, ND, + BS Extremities- no clubbing, cyanosis, or edema MS- no significant deformity or atrophy Skin- no rash or lesion Psych- euthymic mood, full affect Neuro- strength and sensation are intact  EKG-afib at 74 bpm, qrs int 88 ms, qtc 424 ms Epic records reviewe Echo- 2/25-Study Conclusions  - Left ventricle: The cavity size was normal. Wall thickness was   normal. Systolic function was normal. The estimated ejection   fraction was in the range of 50% to 55%. Wall motion was normal;   there were no regional wall motion abnormalities. - Right atrium: The atrium was mildly dilated.    Assessment and  Plan: 1. Persisitent afib Started as paroxysmal many years ago and has been quiet Persisitent for at least the last 3 weeks Tolerating well  No obvious lifestyle issues to address,pt has addressed weight loss/exercise and caffeine use already Continue Cardizem 120 mg daily with good rate control Echo reviewed with pt, overall WNL  2. Chadsvasc score of 1 Continue Eliquis 5 mg bid Denies a bleeding risk/history Bleeding precautions discussed Pt will be on  DOAC x 3 weeks, 3/13,  but will purse cardioversion, at pt's request when he returns from [place], 4/5 Asked not to miss any doses between now and then  He will seen in the office first that day for updated note and labs before going to endo suite  for cardioversion later that am  Butch Penny C. Carroll, [facility] [address] [place], [place] [address] [phone]

## 2017-04-10 NOTE — Patient Instructions (Signed)
Cardioversion scheduled for Friday, April 5th  - Arrive to afib clinic at [place] not eat or drink anything after midnight the night prior to your procedure.  - Take all your medication with a sip of water prior to arrival.  - You will not be able to drive home after your procedure.

## 2017-05-12 ENCOUNTER — Ambulatory Visit (HOSPITAL_COMMUNITY): Payer: PRIVATE HEALTH INSURANCE | Admitting: Certified Registered"

## 2017-05-12 ENCOUNTER — Encounter (HOSPITAL_COMMUNITY)
Admission: RE | Disposition: A | Discharge status: Home / Self Care | Payer: Self-pay | Source: Ambulatory Visit | Attending: Internal Medicine

## 2017-05-12 ENCOUNTER — Ambulatory Visit (HOSPITAL_BASED_OUTPATIENT_CLINIC_OR_DEPARTMENT_OTHER)
Admission: RE | Admit: 2017-05-12 | Discharge: 2017-05-12 | Disposition: A | Discharge status: Home / Self Care | Payer: PRIVATE HEALTH INSURANCE | Source: Ambulatory Visit | Attending: Nurse Practitioner | Admitting: Nurse Practitioner

## 2017-05-12 ENCOUNTER — Other Ambulatory Visit: Payer: Self-pay

## 2017-05-12 ENCOUNTER — Ambulatory Visit (HOSPITAL_COMMUNITY)
Admission: RE | Admit: 2017-05-12 | Discharge: 2017-05-12 | Disposition: A | Discharge status: Home / Self Care | Payer: PRIVATE HEALTH INSURANCE | Source: Ambulatory Visit | Attending: Internal Medicine | Admitting: Internal Medicine

## 2017-05-12 ENCOUNTER — Other Ambulatory Visit (HOSPITAL_COMMUNITY): Payer: Self-pay | Admitting: *Deleted

## 2017-05-12 ENCOUNTER — Encounter (HOSPITAL_COMMUNITY): Payer: Self-pay

## 2017-05-12 ENCOUNTER — Encounter (HOSPITAL_COMMUNITY): Payer: Self-pay | Admitting: Nurse Practitioner

## 2017-05-12 VITALS — BP 144/92 | HR 84 | Ht 71.0 in | Wt 201.6 lb

## 2017-05-12 DIAGNOSIS — Z7982 Long term (current) use of aspirin: Secondary | ICD-10-CM | POA: Diagnosis not present

## 2017-05-12 DIAGNOSIS — Z7901 Long term (current) use of anticoagulants: Secondary | ICD-10-CM | POA: Insufficient documentation

## 2017-05-12 DIAGNOSIS — I481 Persistent atrial fibrillation: Secondary | ICD-10-CM | POA: Diagnosis not present

## 2017-05-12 DIAGNOSIS — Z8249 Family history of ischemic heart disease and other diseases of the circulatory system: Secondary | ICD-10-CM | POA: Diagnosis not present

## 2017-05-12 DIAGNOSIS — Z87891 Personal history of nicotine dependence: Secondary | ICD-10-CM | POA: Insufficient documentation

## 2017-05-12 DIAGNOSIS — I4891 Unspecified atrial fibrillation: Secondary | ICD-10-CM | POA: Diagnosis not present

## 2017-05-12 DIAGNOSIS — E785 Hyperlipidemia, unspecified: Secondary | ICD-10-CM | POA: Insufficient documentation

## 2017-05-12 DIAGNOSIS — I480 Paroxysmal atrial fibrillation: Secondary | ICD-10-CM | POA: Diagnosis present

## 2017-05-12 DIAGNOSIS — Z79899 Other long term (current) drug therapy: Secondary | ICD-10-CM | POA: Diagnosis not present

## 2017-05-12 DIAGNOSIS — I4819 Other persistent atrial fibrillation: Secondary | ICD-10-CM

## 2017-05-12 HISTORY — PX: CARDIOVERSION: SHX1299

## 2017-05-12 LAB — BASIC METABOLIC PANEL
Anion gap: 16 — ABNORMAL HIGH (ref 5–15)
BUN: 13 mg/dL (ref 6–20)
CALCIUM: 9.5 mg/dL (ref 8.9–10.3)
CO2: 23 mmol/L (ref 22–32)
CREATININE: 1.04 mg/dL (ref 0.61–1.24)
Chloride: 103 mmol/L (ref 101–111)
Glucose, Bld: 54 mg/dL — ABNORMAL LOW (ref 65–99)
Potassium: 3.3 mmol/L — ABNORMAL LOW (ref 3.5–5.1)
SODIUM: 142 mmol/L (ref 135–145)

## 2017-05-12 LAB — CBC
HCT: 46 % (ref 39.0–52.0)
Hemoglobin: 14.7 g/dL (ref 13.0–17.0)
MCH: 28.2 pg (ref 26.0–34.0)
MCHC: 32 g/dL (ref 30.0–36.0)
MCV: 88.1 fL (ref 78.0–100.0)
PLATELETS: 256 10*3/uL (ref 150–400)
RBC: 5.22 MIL/uL (ref 4.22–5.81)
RDW: 14.4 % (ref 11.5–15.5)
WBC: 5.5 10*3/uL (ref 4.0–10.5)

## 2017-05-12 SURGERY — CARDIOVERSION
Anesthesia: General

## 2017-05-12 MED ORDER — LIDOCAINE 2% (20 MG/ML) 5 ML SYRINGE
INTRAMUSCULAR | Status: DC | PRN
  Administered 2017-05-12: 60 mg via INTRAVENOUS

## 2017-05-12 MED ORDER — PROPOFOL 10 MG/ML IV BOLUS
INTRAVENOUS | Status: DC | PRN
  Administered 2017-05-12: 100 mg via INTRAVENOUS

## 2017-05-12 MED ORDER — SODIUM CHLORIDE 0.9 % IV SOLN
INTRAVENOUS | Status: DC
  Administered 2017-05-12: 10:00:00 via INTRAVENOUS

## 2017-05-12 NOTE — Anesthesia Preprocedure Evaluation (Signed)
Anesthesia Evaluation  Patient identified by MRN, date of birth, ID band Patient awake    Reviewed: Allergy & Precautions, H&P , Patient's Chart, lab work & pertinent test results, reviewed documented beta blocker date and time   Airway Mallampati: II  TM Distance: >3 FB Neck ROM: full    Dental no notable dental hx.    Pulmonary former smoker,    Pulmonary exam normal breath sounds clear to auscultation       Cardiovascular  Rhythm:regular Rate:Normal     Neuro/Psych    GI/Hepatic   Endo/Other    Renal/GU      Musculoskeletal   Abdominal   Peds  Hematology   Anesthesia Other Findings Systolic function was normal. The estimated ejection   fraction was in the range of 50% to 55%. Wall motion was normal;  Reproductive/Obstetrics                             Anesthesia Physical Anesthesia Plan  ASA: II  Anesthesia Plan: General   Post-op Pain Management:    Induction: Intravenous  PONV Risk Score and Plan:   Airway Management Planned: Mask  Additional Equipment:   Intra-op Plan:   Post-operative Plan: Extubation in OR  Informed Consent: I have reviewed the patients History and Physical, chart, labs and discussed the procedure including the risks, benefits and alternatives for the proposed anesthesia with the patient or authorized representative who has indicated his/her understanding and acceptance.   Dental Advisory Given  Plan Discussed with: CRNA and Surgeon  Anesthesia Plan Comments: (  )        Anesthesia Quick Evaluation

## 2017-05-12 NOTE — Anesthesia Procedure Notes (Signed)
Procedure Name: General with mask airway Date/Time: 05/12/2017 10:28 AM Performed by: Imagene Riches, CRNA Pre-anesthesia Checklist: Patient identified, Emergency Drugs available, Suction available and Patient being monitored Patient Re-evaluated:Patient Re-evaluated prior to induction Oxygen Delivery Method: Ambu bag Preoxygenation: Pre-oxygenation with 100% oxygen Induction Type: IV induction

## 2017-05-12 NOTE — Transfer of Care (Signed)
Immediate Anesthesia Transfer of Care Note  Patient: Robert David  Procedure(s) Performed: CARDIOVERSION (N/A )  Patient Location: PACU  Anesthesia Type:General  Level of Consciousness: drowsy  Airway & Oxygen Therapy: Patient Spontanous Breathing and Patient connected to face mask oxygen  Post-op Assessment: Report given to RN and Post -op Vital signs reviewed and stable  Post vital signs: Reviewed and stable  Last Vitals:  Vitals Value Taken Time  BP    Temp    Pulse    Resp    SpO2      Last Pain:  Vitals:   05/12/17 0947  TempSrc: Oral  PainSc: 0-No pain         Complications: No apparent anesthesia complications

## 2017-05-12 NOTE — Addendum Note (Signed)
Encounter addended by: Sherran Needs, NP on: 05/12/2017 9:30 AM  Actions taken: Sign clinical note

## 2017-05-12 NOTE — Interval H&P Note (Signed)
History and Physical Interval Note:  05/12/2017 10:29 AM  Robert David  has presented today for surgery, with the diagnosis of AFIB  The various methods of treatment have been discussed with the patient and family. After consideration of risks, benefits and other options for treatment, the patient has consented to  Procedure(s): CARDIOVERSION (N/A) as a surgical intervention .  The patient's history has been reviewed, patient examined, no change in status, stable for surgery.  I have reviewed the patient's chart and labs.  Questions were answered to the patient's satisfaction.     Dorris Carnes

## 2017-05-12 NOTE — Anesthesia Postprocedure Evaluation (Signed)
Anesthesia Post Note  Patient: Robert David  Procedure(s) Performed: CARDIOVERSION (N/A )     Patient location during evaluation: PACU Anesthesia Type: General Level of consciousness: awake and alert Pain management: pain level controlled Vital Signs Assessment: post-procedure vital signs reviewed and stable Respiratory status: spontaneous breathing, nonlabored ventilation, respiratory function stable and patient connected to nasal cannula oxygen Cardiovascular status: blood pressure returned to baseline and stable Postop Assessment: no apparent nausea or vomiting Anesthetic complications: no    Last Vitals:  Vitals:   05/12/17 1050 05/12/17 1100  BP: (!) 156/97 (!) 156/96  Pulse: 86 74  Resp: 14 17  Temp:    SpO2: 97% 100%    Last Pain:  Vitals:   05/12/17 1100  TempSrc:   PainSc: 0-No pain                 [name] [name]

## 2017-05-12 NOTE — Discharge Instructions (Signed)
Electrical Cardioversion, Care After °This sheet gives you information about how to care for yourself after your procedure. Your health care provider may also give you more specific instructions. If you have problems or questions, contact your health care provider. °What can I expect after the procedure? °After the procedure, it is common to have: °· Some redness on the skin where the shocks were given. ° °Follow these instructions at home: °· Do not drive for 24 hours if you were given a medicine to help you relax (sedative). °· Take over-the-counter and prescription medicines only as told by your health care provider. °· Ask your health care provider how to check your pulse. Check it often. °· Rest for 48 hours after the procedure or as told by your health care provider. °· Avoid or limit your caffeine use as told by your health care provider. °Contact a health care provider if: °· You feel like your heart is beating too quickly or your pulse is not regular. °· You have a serious muscle cramp that does not go away. °Get help right away if: °· You have discomfort in your chest. °· You are dizzy or you feel faint. °· You have trouble breathing or you are short of breath. °· Your speech is slurred. °· You have trouble moving an arm or leg on one side of your body. °· Your fingers or toes turn cold or blue. °This information is not intended to replace advice given to you by your health care provider. Make sure you discuss any questions you have with your health care provider. °Document Released: 11/14/2012 Document Revised: 08/28/2015 Document Reviewed: 07/31/2015 °Elsevier Interactive Patient Education © 2018 [employer] Inc[employer] ° °

## 2017-05-12 NOTE — Op Note (Signed)
Cardioversion  Pt anesthetized by anesthesia with propofol and lidocaine With pads in AP position patient cardioverted to SR with 200 J synchronized biphasic energy    Procedure without complication.

## 2017-05-12 NOTE — H&P (View-Only) (Signed)
Primary Care Physician: Marin Olp, MD Referring Physician: Dr. Kyra Leyland is a 67 y.o. male with a h/o paroxysmal afib that goes back 25 years. It has been very quiet. He has noted irregular heart beat for several weeks. His daily meds are 81 mg ASA and 120 mg diltiazem a day, which he did take irregularly but has been taking on a daily basis for a couple of weeks. Chadsvasc score is 1 for age. He works full time as a Theme park manager. He has been on a diet since January and has lost 16 lbs.He exercises on a regular basis. He does not smoke. Has been known to drink a energy drink in the past but has stopped this. He is trying to limit his caffeine. He does snore some, but wife states that no apnea witnessed and snoring has improved with weight loss. No alcohol. He is well rate controlled and does not appear to be overly symptomatic in afib.   F/u in afib clinic, 3/4, he has now been on anticoagualtion as of 2 weeks 3/6. He remains in rate controlled afib. She states that he is not symptomatic and would prefer to scheduled a cardioversion when he returns from [place] to see his daughter, who is ill, early April.  Echo updated in February  which showed mild  atrial enlargement, otherwise normal EKG..   F/u afib clinic, 05/12/17. He has now returned from visiting daughter in [place] and is ready for cardioversion. He is tolerating afib well. He has not missed any doses of eliquis.   Today, he denies symptoms of  chest pain, shortness of breath, orthopnea, PND, lower extremity edema, dizziness, presyncope, syncope, or neurologic sequela. The patient is tolerating medications without difficulties and is otherwise without complaint today.   Past Medical History:  Diagnosis Date  . Allergy    seasonal  . Atrial fibrillation ([place]) 05/26/2009   under control  . Headache(784.0) 03/23/2009  . HYPERLIPIDEMIA 05/11/2007   Past Surgical History:  Procedure Laterality Date  . COLONOSCOPY    . LEFT  HEART CATHETERIZATION WITH CORONARY ANGIOGRAM N/A 07/19/2012   Procedure: LEFT HEART CATHETERIZATION WITH CORONARY ANGIOGRAM;  Surgeon: Minus Breeding, MD;  Location: [facility] CATH LAB;  Service: Cardiovascular;  Laterality: N/A;  . ORIF ANKLE FRACTURE Right 02/17/2016   Procedure: OPEN REDUCTION INTERNAL FIXATION (ORIF) BIMALLEOLAR ANKLE FRACTURE;  Surgeon: Leandrew Koyanagi, MD;  Location: [place];  Service: Orthopedics;  Laterality: Right;  . POLYPECTOMY      Current Outpatient Medications  Medication Sig Dispense Refill  . apixaban (ELIQUIS) 5 MG TABS tablet Take 1 tablet (5 mg total) by mouth 2 (two) times daily. 60 tablet 6  . diltiazem (DILT-XR) 120 MG 24 hr capsule TAKE 1 CAPSULE(120 MG) BY MOUTH DAILY 30 capsule 3   No current facility-administered medications for this encounter.     No Known Allergies  Social History   Socioeconomic History  . Marital status: Married    Spouse name: Not on file  . Number of children: Not on file  . Years of education: Not on file  . Highest education level: Not on file  Occupational History  . Not on file  Social Needs  . Financial resource strain: Not on file  . Food insecurity:    Worry: Not on file    Inability: Not on file  . Transportation needs:    Medical: Not on file    Non-medical: Not on file  Tobacco  Use  . Smoking status: Former Smoker    Packs/day: 1.00    Years: 10.00    Pack years: 10.00    Types: Cigarettes    Last attempt to quit: 10/24/1988    Years since quitting: 28.5  . Smokeless tobacco: Never Used  Substance and Sexual Activity  . Alcohol use: No    Alcohol/week: 0.0 oz  . Drug use: No  . Sexual activity: Not on file  Lifestyle  . Physical activity:    Days per week: Not on file    Minutes per session: Not on file  . Stress: Not on file  Relationships  . Social connections:    Talks on phone: Not on file    Gets together: Not on file    Attends religious service: Not on file    Active  member of club or organization: Not on file    Attends meetings of clubs or organizations: Not on file    Relationship status: Not on file  . Intimate partner violence:    Fear of current or ex partner: Not on file    Emotionally abused: Not on file    Physically abused: Not on file    Forced sexual activity: Not on file  Other Topics Concern  . Not on file  Social History Narrative   Married (wife [name] (bobby gail) also Dr. Yong Channel patient) [phone]. 3 step children. 3 stepgrandchildren.       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       Hobbies: bowling, golf, exercise     Family History  Problem Relation Age of Onset  . Cancer Mother        breast  . Breast cancer Mother   . Heart disease Father        hx A-Fib, 22 with MI  . Colon cancer Father 35       metastatic to lung  . Rectal cancer Neg Hx   . Stomach cancer Neg Hx     ROS- All systems are reviewed and negative except as per the HPI above  Physical Exam: Vitals:   05/12/17 0857  BP: (!) 144/92  Pulse: 84  Weight: 201 lb 9.6 oz (91.4 kg)  Height: 5\' 11"  (1.803 m)   Wt Readings from Last 3 Encounters:  05/12/17 201 lb 9.6 oz (91.4 kg)  04/10/17 199 lb (90.3 kg)  03/28/17 199 lb 6.4 oz (90.4 kg)    Labs: Lab Results  Component Value Date   NA 143 03/14/2016   K 4.4 03/14/2016   CL 106 03/14/2016   CO2 31 03/14/2016   GLUCOSE 90 03/14/2016   BUN 16 03/14/2016   CREATININE 1.04 03/14/2016   CALCIUM 9.5 03/14/2016   Lab Results  Component Value Date   INR 1.10 07/19/2012   Lab Results  Component Value Date   CHOL 206 (H) 03/14/2016   HDL 44.50 03/14/2016   LDLCALC 141 (H) 03/14/2016   TRIG 101.0 03/14/2016     GEN- The patient is well appearing, alert and oriented x 3 today.   Head- normocephalic, atraumatic Eyes-  Sclera clear, conjunctiva pink Ears- hearing intact Oropharynx- clear Neck- supple, no JVP Lymph- no cervical lymphadenopathy Lungs- Clear to ausculation  bilaterally, normal work of breathing Heart- Regular rate and rhythm, no murmurs, rubs or gallops, PMI not laterally displaced GI- soft, NT, ND, + BS Extremities- no clubbing, cyanosis, or edema MS- no significant deformity or atrophy Skin- no rash or  lesion Psych- euthymic mood, full affect Neuro- strength and sensation are intact  EKG-afib at 84 bpm, qrs int 88 ms, qtc 446 ms Epic records reviewe Echo- 2/25-Study Conclusions  - Left ventricle: The cavity size was normal. Wall thickness was   normal. Systolic function was normal. The estimated ejection   fraction was in the range of 50% to 55%. Wall motion was normal;   there were no regional wall motion abnormalities. - Right atrium: The atrium was mildly dilated.    Assessment and Plan: 1. Persisitent afib Started as paroxysmal many years ago and has been quiet Persisitent for at least the last 6 weeks Tolerating well  No obvious lifestyle issues to address,pt has addressed weight loss/exercise and caffeine use already Continue Cardizem 120 mg daily with good rate control Echo reviewed with pt, overall WNL  2. Chadsvasc score of 1 Continue Eliquis 5 mg bid Denies a bleeding risk/history Bleeding precautions discussed No missed doses for at least 3 weeks  Cardioversion pending for 11 am today Cbc/bmet drawn  Butch Penny C. Cyriah Childrey, [facility] [address] [place], [place] [address] [phone]

## 2017-05-12 NOTE — Progress Notes (Addendum)
Primary Care Physician: Marin Olp, MD Referring Physician: Dr. Kyra Leyland is a 67 y.o. male with a h/o paroxysmal afib that goes back 25 years. It has been very quiet. He has noted irregular heart beat for several weeks. His daily meds are 81 mg ASA and 120 mg diltiazem a day, which he did take irregularly but has been taking on a daily basis for a couple of weeks. Chadsvasc score is 1 for age. He works full time as a Theme park manager. He has been on a diet since January and has lost 16 lbs.He exercises on a regular basis. He does not smoke. Has been known to drink a energy drink in the past but has stopped this. He is trying to limit his caffeine. He does snore some, but wife states that no apnea witnessed and snoring has improved with weight loss. No alcohol. He is well rate controlled and does not appear to be overly symptomatic in afib.   F/u in afib clinic, 3/4, he has now been on anticoagualtion as of 2 weeks 3/6. He remains in rate controlled afib. She states that he is not symptomatic and would prefer to scheduled a cardioversion when he returns from [place] to see his daughter, who is ill, early April.  Echo updated in February  which showed mild  atrial enlargement, otherwise normal EKG..   F/u afib clinic, 05/12/17. He has now returned from visiting daughter in [place] and is ready for cardioversion. He is tolerating afib well. He has not missed any doses of eliquis.   Today, he denies symptoms of  chest pain, shortness of breath, orthopnea, PND, lower extremity edema, dizziness, presyncope, syncope, or neurologic sequela. The patient is tolerating medications without difficulties and is otherwise without complaint today.   Past Medical History:  Diagnosis Date  . Allergy    seasonal  . Atrial fibrillation ([place]) 05/26/2009   under control  . Headache(784.0) 03/23/2009  . HYPERLIPIDEMIA 05/11/2007   Past Surgical History:  Procedure Laterality Date  . COLONOSCOPY    . LEFT  HEART CATHETERIZATION WITH CORONARY ANGIOGRAM N/A 07/19/2012   Procedure: LEFT HEART CATHETERIZATION WITH CORONARY ANGIOGRAM;  Surgeon: Minus Breeding, MD;  [facility] CATH LAB;  Service: Cardiovascular;  Laterality: N/A;  . ORIF ANKLE FRACTURE Right 02/17/2016   Procedure: OPEN REDUCTION INTERNAL FIXATION (ORIF) BIMALLEOLAR ANKLE FRACTURE;  Surgeon: Leandrew Koyanagi, MD;  Location: [place];  Service: Orthopedics;  Laterality: Right;  . POLYPECTOMY      Current Outpatient Medications  Medication Sig Dispense Refill  . apixaban (ELIQUIS) 5 MG TABS tablet Take 1 tablet (5 mg total) by mouth 2 (two) times daily. 60 tablet 6  . diltiazem (DILT-XR) 120 MG 24 hr capsule TAKE 1 CAPSULE(120 MG) BY MOUTH DAILY 30 capsule 3   No current facility-administered medications for this encounter.     No Known Allergies  Social History   Socioeconomic History  . Marital status: Married    Spouse name: Not on file  . Number of children: Not on file  . Years of education: Not on file  . Highest education level: Not on file  Occupational History  . Not on file  Social Needs  . Financial resource strain: Not on file  . Food insecurity:    Worry: Not on file    Inability: Not on file  . Transportation needs:    Medical: Not on file    Non-medical: Not on file  Tobacco  Use  . Smoking status: Former Smoker    Packs/day: 1.00    Years: 10.00    Pack years: 10.00    Types: Cigarettes    Last attempt to quit: 10/24/1988    Years since quitting: 28.5  . Smokeless tobacco: Never Used  Substance and Sexual Activity  . Alcohol use: No    Alcohol/week: 0.0 oz  . Drug use: No  . Sexual activity: Not on file  Lifestyle  . Physical activity:    Days per week: Not on file    Minutes per session: Not on file  . Stress: Not on file  Relationships  . Social connections:    Talks on phone: Not on file    Gets together: Not on file    Attends religious service: Not on file    Active  member of club or organization: Not on file    Attends meetings of clubs or organizations: Not on file    Relationship status: Not on file  . Intimate partner violence:    Fear of current or ex partner: Not on file    Emotionally abused: Not on file    Physically abused: Not on file    Forced sexual activity: Not on file  Other Topics Concern  . Not on file  Social History Narrative   Married (wife [name] (bobby gail) also Dr. Yong Channel patient) [phone]. 3 step children. 3 stepgrandchildren.       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       Hobbies: bowling, golf, exercise     Family History  Problem Relation Age of Onset  . Cancer Mother        breast  . Breast cancer Mother   . Heart disease Father        hx A-Fib, 69 with MI  . Colon cancer Father 34       metastatic to lung  . Rectal cancer Neg Hx   . Stomach cancer Neg Hx     ROS- All systems are reviewed and negative except as per the HPI above  Physical Exam: Vitals:   05/12/17 0857  BP: (!) 144/92  Pulse: 84  Weight: 201 lb 9.6 oz (91.4 kg)  Height: 5\' 11"  (1.803 m)   Wt Readings from Last 3 Encounters:  05/12/17 201 lb 9.6 oz (91.4 kg)  04/10/17 199 lb (90.3 kg)  03/28/17 199 lb 6.4 oz (90.4 kg)    Labs: Lab Results  Component Value Date   NA 143 03/14/2016   K 4.4 03/14/2016   CL 106 03/14/2016   CO2 31 03/14/2016   GLUCOSE 90 03/14/2016   BUN 16 03/14/2016   CREATININE 1.04 03/14/2016   CALCIUM 9.5 03/14/2016   Lab Results  Component Value Date   INR 1.10 07/19/2012   Lab Results  Component Value Date   CHOL 206 (H) 03/14/2016   HDL 44.50 03/14/2016   LDLCALC 141 (H) 03/14/2016   TRIG 101.0 03/14/2016     GEN- The patient is well appearing, alert and oriented x 3 today.   Head- normocephalic, atraumatic Eyes-  Sclera clear, conjunctiva pink Ears- hearing intact Oropharynx- clear Neck- supple, no JVP Lymph- no cervical lymphadenopathy Lungs- Clear to ausculation  bilaterally, normal work of breathing Heart- Regular rate and rhythm, no murmurs, rubs or gallops, PMI not laterally displaced GI- soft, NT, ND, + BS Extremities- no clubbing, cyanosis, or edema MS- no significant deformity or atrophy Skin- no rash or  lesion Psych- euthymic mood, full affect Neuro- strength and sensation are intact  EKG-afib at 84 bpm, qrs int 88 ms, qtc 446 ms Epic records reviewe Echo- 2/25-Study Conclusions  - Left ventricle: The cavity size was normal. Wall thickness was   normal. Systolic function was normal. The estimated ejection   fraction was in the range of 50% to 55%. Wall motion was normal;   there were no regional wall motion abnormalities. - Right atrium: The atrium was mildly dilated.    Assessment and Plan: 1. Persisitent afib Started as paroxysmal many years ago and has been quiet Persisitent for at least the last 6 weeks Tolerating well  No obvious lifestyle issues to address,pt has addressed weight loss/exercise and caffeine use already Continue Cardizem 120 mg daily with good rate control Echo reviewed with pt, overall WNL  2. Chadsvasc score of 1 Continue Eliquis 5 mg bid Denies a bleeding risk/history Bleeding precautions discussed No missed doses for at least 3 weeks  Cardioversion pending for 11 am today Cbc/bmet drawn  Butch Penny C. Lovett Coffin, [facility] [address] [place], [place] [address] [phone]

## 2017-05-14 ENCOUNTER — Encounter (HOSPITAL_COMMUNITY): Payer: Self-pay | Admitting: Internal Medicine

## 2017-05-15 ENCOUNTER — Encounter (HOSPITAL_COMMUNITY): Payer: Self-pay | Admitting: *Deleted

## 2017-05-15 ENCOUNTER — Ambulatory Visit (HOSPITAL_COMMUNITY): Payer: PRIVATE HEALTH INSURANCE | Admitting: Nurse Practitioner

## 2017-05-22 ENCOUNTER — Ambulatory Visit (HOSPITAL_COMMUNITY)
Admission: RE | Admit: 2017-05-22 | Discharge: 2017-05-22 | Disposition: A | Discharge status: Home / Self Care | Payer: PRIVATE HEALTH INSURANCE | Source: Ambulatory Visit | Attending: Nurse Practitioner | Admitting: Nurse Practitioner

## 2017-05-22 ENCOUNTER — Encounter (HOSPITAL_COMMUNITY): Payer: Self-pay | Admitting: Nurse Practitioner

## 2017-05-22 VITALS — BP 122/66 | HR 74 | Ht 71.0 in | Wt 201.0 lb

## 2017-05-22 DIAGNOSIS — Z7901 Long term (current) use of anticoagulants: Secondary | ICD-10-CM | POA: Insufficient documentation

## 2017-05-22 DIAGNOSIS — E785 Hyperlipidemia, unspecified: Secondary | ICD-10-CM | POA: Insufficient documentation

## 2017-05-22 DIAGNOSIS — I4891 Unspecified atrial fibrillation: Secondary | ICD-10-CM | POA: Insufficient documentation

## 2017-05-22 DIAGNOSIS — Z87891 Personal history of nicotine dependence: Secondary | ICD-10-CM | POA: Diagnosis not present

## 2017-05-22 DIAGNOSIS — I4819 Other persistent atrial fibrillation: Secondary | ICD-10-CM

## 2017-05-22 DIAGNOSIS — I481 Persistent atrial fibrillation: Secondary | ICD-10-CM

## 2017-05-22 NOTE — Progress Notes (Signed)
Primary Care Physician: Marin Olp, MD Referring Physician: Dr. Kyra Leyland is a 67 y.o. male with a h/o paroxysmal afib that goes back 25 years. It has been very quiet. He has noted irregular heart beat for several weeks. His daily meds are 81 mg ASA and 120 mg diltiazem a day, which he did take irregularly but has been taking on a daily basis for a couple of weeks. Chadsvasc score is 1 for age. He works full time as a Theme park manager. He has been on a diet since January and has lost 16 lbs.He exercises on a regular basis. He does not smoke. Has been known to drink a energy drink in the past but has stopped this. He is trying to limit his caffeine. He does snore some, but wife states that no apnea witnessed and snoring has improved with weight loss. No alcohol. He is well rate controlled and does not appear to be overly symptomatic in afib. Last Echo 2011 which showed mild to moderate left atrial enlargement.   F/u in afib clinic, 3/4, he hadnow been on anticoagualtion as of 2 weeks 3/6. He remains in rate controlled afib. He states that he is not symptomatic and would prefer to scheduled a cardioversion when he returns from [place] to see his daughter, who is ill,, early April. Echo recently updated and wnl.  F/u one week from cardioversion. He returned to afib yesterday or this am, hard to tell as he is not symptomatic. IIn fact, he could tell no difference being in SR, thinks he may have felt worse. We discussed antiarrythmic's but he feels that he would like to continue with rate control.  Today, he denies symptoms of  chest pain, shortness of breath, orthopnea, PND, lower extremity edema, dizziness, presyncope, syncope, or neurologic sequela. The patient is tolerating medications without difficulties and is otherwise without complaint today.   Past Medical History:  Diagnosis Date  . Allergy    seasonal  . Atrial fibrillation ([place]) 05/26/2009   under control  . Headache(784.0)  03/23/2009  . HYPERLIPIDEMIA 05/11/2007   Past Surgical History:  Procedure Laterality Date  . CARDIOVERSION N/A 05/12/2017   Procedure: CARDIOVERSION;  Surgeon: Fay Records, MD;  Location: [facility] ENDOSCOPY;  Service: Cardiovascular;  Laterality: N/A;  . COLONOSCOPY    . LEFT HEART CATHETERIZATION WITH CORONARY ANGIOGRAM N/A 07/19/2012   Procedure: LEFT HEART CATHETERIZATION WITH CORONARY ANGIOGRAM;  Surgeon: Minus Breeding, MD;  Location[facility] CATH LAB;  Service: Cardiovascular;  Laterality: N/A;  . ORIF ANKLE FRACTURE Right 02/17/2016   Procedure: OPEN REDUCTION INTERNAL FIXATION (ORIF) BIMALLEOLAR ANKLE FRACTURE;  Surgeon: Leandrew Koyanagi, MD;  Location: [place];  Service: Orthopedics;  Laterality: Right;  . POLYPECTOMY      Current Outpatient Medications  Medication Sig Dispense Refill  . apixaban (ELIQUIS) 5 MG TABS tablet Take 1 tablet (5 mg total) by mouth 2 (two) times daily. 60 tablet 6  . diltiazem (DILT-XR) 120 MG 24 hr capsule TAKE 1 CAPSULE(120 MG) BY MOUTH DAILY 30 capsule 3   No current facility-administered medications for this encounter.     No Known Allergies  Social History   Socioeconomic History  . Marital status: Married    Spouse name: Not on file  . Number of children: Not on file  . Years of education: Not on file  . Highest education level: Not on file  Occupational History  . Not on file  Social Needs  .  Financial resource strain: Not on file  . Food insecurity:    Worry: Not on file    Inability: Not on file  . Transportation needs:    Medical: Not on file    Non-medical: Not on file  Tobacco Use  . Smoking status: Former Smoker    Packs/day: 1.00    Years: 10.00    Pack years: 10.00    Types: Cigarettes    Last attempt to quit: 10/24/1988    Years since quitting: 28.5  . Smokeless tobacco: Never Used  Substance and Sexual Activity  . Alcohol use: No    Alcohol/week: 0.0 oz  . Drug use: No  . Sexual activity: Not on file    Lifestyle  . Physical activity:    Days per week: Not on file    Minutes per session: Not on file  . Stress: Not on file  Relationships  . Social connections:    Talks on phone: Not on file    Gets together: Not on file    Attends religious service: Not on file    Active member of club or organization: Not on file    Attends meetings of clubs or organizations: Not on file    Relationship status: Not on file  . Intimate partner violence:    Fear of current or ex partner: Not on file    Emotionally abused: Not on file    Physically abused: Not on file    Forced sexual activity: Not on file  Other Topics Concern  . Not on file  Social History Narrative   Married (wife [name] (bobby gail) also Dr. Yong Channel patient) [phone]. 3 step children. 3 stepgrandchildren.       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       Hobbies: bowling, golf, exercise     Family History  Problem Relation Age of Onset  . Cancer Mother        breast  . Breast cancer Mother   . Heart disease Father        hx A-Fib, 25 with MI  . Colon cancer Father 24       metastatic to lung  . Rectal cancer Neg Hx   . Stomach cancer Neg Hx     ROS- All systems are reviewed and negative except as per the HPI above  Physical Exam: Vitals:   05/22/17 1021  BP: 122/66  Pulse: 74  Weight: 201 lb (91.2 kg)  Height: 5\' 11"  (1.803 m)   Wt Readings from Last 3 Encounters:  05/22/17 201 lb (91.2 kg)  05/12/17 201 lb (91.2 kg)  05/12/17 201 lb 9.6 oz (91.4 kg)    Labs: Lab Results  Component Value Date   NA 142 05/12/2017   K 3.3 (L) 05/12/2017   CL 103 05/12/2017   CO2 23 05/12/2017   GLUCOSE 54 (L) 05/12/2017   BUN 13 05/12/2017   CREATININE 1.04 05/12/2017   CALCIUM 9.5 05/12/2017   Lab Results  Component Value Date   INR 1.10 07/19/2012   Lab Results  Component Value Date   CHOL 206 (H) 03/14/2016   HDL 44.50 03/14/2016   LDLCALC 141 (H) 03/14/2016   TRIG 101.0 03/14/2016     GEN-  The patient is well appearing, alert and oriented x 3 today.   Head- normocephalic, atraumatic Eyes-  Sclera clear, conjunctiva pink Ears- hearing intact Oropharynx- clear Neck- supple, no JVP Lymph- no cervical lymphadenopathy Lungs- Clear to  ausculation bilaterally, normal work of breathing Heart- Regular rate and rhythm, no murmurs, rubs or gallops, PMI not laterally displaced GI- soft, NT, ND, + BS Extremities- no clubbing, cyanosis, or edema MS- no significant deformity or atrophy Skin- no rash or lesion Psych- euthymic mood, full affect Neuro- strength and sensation are intact  EKG-afib at 74 bpm, qrs int 88 ms, qtc 424 ms Epic records reviewed Echo- 2/25-Study Conclusions  - Left ventricle: The cavity size was normal. Wall thickness was   normal. Systolic function was normal. The estimated ejection   fraction was in the range of 50% to 55%. Wall motion was normal;   there were no regional wall motion abnormalities. - Right atrium: The atrium was mildly dilated.    Assessment and Plan: 1. Persisitent  symptomatic afib Started as paroxysmal many years ago and has been quiet Persisitent for at least the last couple months S/p successful cardioversion but ERAF Tolerating well Discussed options, antiarrythmic's, pt did not feel improved in SR and feels that he would like to proceed with rate control No obvious lifestyle issues to address,pt has addressed weight loss/exercise and caffeine use already Continue Cardizem 120 mg daily with good rate control Echo reviewed with pt, overall WNL  2. Chadsvasc score of 1(age) Continue Eliquis 5 mg bid, will have to continue for at least 3 more weeks after cardioversion Denies a bleeding risk/history Will need to have discussion when he establishes with cariology, if he wants to continue anticoagulation, with chadsvasc score of 1,  with living in afib, I would be inclined to continue anticoagulation    Will get pt to f/u in the  [place] area with Dr. Domenic Polite with in   the month to get established  Butch Penny C. Otha Rickles, [facility] [address] [place],  [address] [phone]

## 2017-05-26 ENCOUNTER — Telehealth: Payer: Self-pay | Admitting: Family Medicine

## 2017-05-26 NOTE — Telephone Encounter (Signed)
Copied from [place] [id number]. Topic: Appointment Scheduling - Scheduling Inquiry for Clinic >> May 26, 2017  2:54 PM Rutherford Nail, [place] wrote: Reason for CRM: Patient calling and states that he wanted to schedule a physical. Offered him the first available (07/2017) and he states no, that is too late. He states he had a cardio aversion that lasted 1 week and his blood levels were "out of wack, potassium was low". States he would like to be scheduled for a physical or blood work before his mission trip at the end of May. Please advise. CB#: [phone]

## 2017-05-29 NOTE — Telephone Encounter (Signed)
CRM created. 

## 2017-05-31 ENCOUNTER — Telehealth: Payer: Self-pay | Admitting: Family Medicine

## 2017-05-31 NOTE — Telephone Encounter (Signed)
Can he come in to discuss his lab work concerns and we can update blood work at that time-perhaps find him a morning appointment in the next few weeks?  Certainly we can get him in much sooner than June like that.  He can go ahead and schedule a physical as well if he would like

## 2017-05-31 NOTE — Telephone Encounter (Unsigned)
Copied from [place] [id number]. Topic: Appointment Scheduling - Scheduling Inquiry for Clinic >> May 26, 2017  2:54 PM Rutherford Nail, [place] wrote: Reason for CRM: Patient calling and states that he wanted to schedule a physical. Offered him the first available (07/2017) and he states no, that is too late. He states he had a cardio aversion that lasted 1 week and his blood levels were "out of wack, potassium was low". States he would like to be scheduled for a physical or blood work before his mission trip at the end of May. Please advise. CB#: [phone]  >> May 29, 2017  3:45 PM Self, Lillette Boxer wrote: Yong Channel team care, please advise on how to proceed.

## 2017-06-01 NOTE — Telephone Encounter (Signed)
Scheduled patient for an office visit on May 16th to come and have blood work done.

## 2017-06-06 ENCOUNTER — Encounter: Payer: Self-pay | Admitting: Family Medicine

## 2017-06-06 ENCOUNTER — Ambulatory Visit: Payer: PRIVATE HEALTH INSURANCE | Admitting: Family Medicine

## 2017-06-06 VITALS — BP 132/82 | HR 78 | Temp 98.1°F | Ht 71.0 in | Wt 207.0 lb

## 2017-06-06 DIAGNOSIS — M7041 Prepatellar bursitis, right knee: Secondary | ICD-10-CM

## 2017-06-06 DIAGNOSIS — H6121 Impacted cerumen, right ear: Secondary | ICD-10-CM | POA: Diagnosis not present

## 2017-06-06 NOTE — Progress Notes (Signed)
Subjective:  Robert David is a 67 y.o. year old very pleasant male patient who presents for/with See problem oriented charting ROS-complains of left ear fullness and difficulty hearing.  Complains of swelling of right knee.  No chest pain or shortness of breath  Past Medical History-  Patient Active Problem List   Diagnosis Date Noted  . Atrial fibrillation ([place]) 05/26/2009    Priority: High  . Hyperglycemia 09/01/2014    Priority: Medium  . Elevated blood pressure (not hypertension) 09/01/2014    Priority: Medium  . Insomnia 02/24/2014    Priority: Medium  . Diastolic dysfunction 16/11/9602    Priority: Medium  . Hyperlipidemia 05/11/2007    Priority: Medium  . Contact dermatitis 09/01/2014    Priority: Low  . Former smoker 02/24/2014    Priority: Low  . Chest pain 07/18/2012    Priority: Low  . GERD (gastroesophageal reflux disease) 07/18/2012    Priority: Low  . Displaced bimalleolar fracture of right lower leg, subsequent encounter for closed fracture with delayed healing 02/12/2016    Medications- reviewed and updated Current Outpatient Medications  Medication Sig Dispense Refill  . apixaban (ELIQUIS) 5 MG TABS tablet Take 1 tablet (5 mg total) by mouth 2 (two) times daily. 60 tablet 6  . diltiazem (DILT-XR) 120 MG 24 hr capsule TAKE 1 CAPSULE(120 MG) BY MOUTH DAILY 30 capsule 3   No current facility-administered medications for this visit.     Objective: BP 132/82 (BP Location: Left Arm, Patient Position: Sitting, Cuff Size: Normal)   Pulse 78   Temp 98.1 F (36.7 C) (Oral)   Ht 5\' 11"  (1.803 m)   Wt 207 lb (93.9 kg)   SpO2 98%   BMI 28.87 kg/m  Gen: NAD, resting comfortably Tympanic membrane obstruction on the right by cerumen, resolved with irrigation CV: Irregularly irregular no murmurs rubs or gallops Lungs: CTAB no crackles, wheeze, rhonchi Abdomen: soft/nontender/nondistended/normal bowel sounds.  Ext: no edema Skin: warm, dry Neuro: Hard of hearing  from right ear initially- improved after irrigation MSK: Significant effusion in the prepatellar space.  Mild warmth.  Minimal erythema.    Prepatellar bursitis- aspiration and injection Consent obtained and verified. Sterile betadine prep. Furthur cleansed with alcohol. Topical analgesic spray: Ethyl chloride. Bursa: Right prepatellar bursa Approached in typical fashion with: Anterior approach Completed without difficulty.  12 cc of serosanguineous/bloody fluid aspirated Meds: 2 cc of depo medrol 40 mg/ml Needle: 21 gauge 1.5 inch Aftercare instructions and Red flags advised. Sports medicine handout for this printed.  Patient tolerated procedure very well.   Assessment/Plan:  Prepatellar bursitis of right knee - Plan: Synovial fluid, crystal S: Patient saw me for bursitis of the right knee back in August.  We advised conservative care with icing, compression, trial of NSAIDs-though NSAIDs not ideal with his Eliquis now.  He tried this without relief and in fact about 2 months ago it seemed to enlarge some though it has been stable for the last month and a half.  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  He has an upcoming mission trip within the state of [place] and is really hoping for some improvement A/P: Significant prepatellar bursitis of the right knee.  Has not responded to conservative management over 6 months.  We aspirated the knee today and noted 12 cc serosanguinous /bloody fluid.  We  injected with Depo-Medrol.  Given duration of 6 months strongly doubt infectious and did not sent for culture.  We will send for culture analysis to rule out gout.  If he has recurrence I encouraged him to follow-up with Dr. Paulla Fore of sports medicine.  Hearing loss secondary to cerumen impaction, right S: right ear fullness for at least a few days and seems to be getting worse.  Notes some hearing loss A/P: resolution of hearing loss and right ear fullness after irrigation on right ear  Future Appointments  Date Time Provider [place]  06/13/2017  1:20 PM Satira Sark, MD [facility]  06/22/2017  1:45 PM Marin Olp, MD [facility]   Lab/Order associations: Prepatellar bursitis of right knee - Plan: Synovial fluid, crystal  Return precautions advised.  Garret Reddish, MD

## 2017-06-06 NOTE — Patient Instructions (Addendum)
Glad we got the ear cleaned out!   Hoping the steroid will calm down the inflammation in the knee and help this not come back. If this recurs- lets get you into Dr. Paulla Fore for his opinion. I have post injection instructions from Dr. Paulla Fore included below     You had an injection today.  Things to be aware of after injection are listed below: . You may experience no significant improvement or even a slight worsening in your symptoms during the first 24 to 48 hours.  After that we expect your symptoms to improve gradually over the next 2 weeks for the medicine to have its maximal effect.  You should continue to have improvement out to 6 weeks after your injection. . Dr. Paulla Fore recommends icing the site of the injection for 20 minutes  1-2 times the day of your injection . You may shower but no swimming, tub bath or Jacuzzi for 24 hours. . If your bandage falls off this does not need to be replaced.  It is appropriate to remove the bandage after 4 hours. . You may resume light activities as tolerated unless otherwise directed per Dr. Paulla Fore during your visit  POSSIBLE STEROID SIDE EFFECTS:  Side effects from injectable steroids tend to be less than when taken orally however you may experience some of the symptoms listed below.  If experienced these should only last for a short period of time. Change in menstrual flow  Edema (swelling)  Increased appetite Skin flushing (redness)  Skin rash/acne  Thrush (oral) Yeast vaginitis    Increased sweating  Depression Increased blood glucose levels Cramping and leg/calf  Euphoria (feeling happy)  POSSIBLE PROCEDURE SIDE EFFECTS: The side effects of the injection are usually fairly minimal however if you may experience some of the following side effects that are usually self-limited and will is off on their own.  If you are concerned please feel free to call the office with questions:  Increased numbness or tingling  Nausea or vomiting  Swelling or bruising at  the injection site   Please call our office if if you experience any of the following symptoms over the next week as these can be signs of infection:   Fever greater than 100.27F  Significant swelling at the injection site  Significant redness or drainage from the injection site  If after 2 weeks you are continuing to have worsening symptoms please call our office to discuss what the next appropriate actions should be including the potential for a return office visit or other diagnostic testing.

## 2017-06-07 LAB — SYNOVIAL FLUID, CRYSTAL

## 2017-06-08 ENCOUNTER — Telehealth: Payer: Self-pay

## 2017-06-08 ENCOUNTER — Encounter: Payer: Self-pay | Admitting: Family Medicine

## 2017-06-08 NOTE — Telephone Encounter (Signed)
Called patient to see if he had viewed his results. His wife stated " he's not here at the moment. I think he did view his lab results but I'm not 100% sure." Patient wife stated she will give him a call and have him call the office back. Patient wife verbalized understanding.

## 2017-06-12 ENCOUNTER — Encounter: Payer: Self-pay | Admitting: Cardiology

## 2017-06-12 NOTE — Progress Notes (Signed)
Cardiology Office Note  Date: 06/13/2017   ID: Robert David, DOB 14-Nov-1950, MRN [id number]  PCP: Marin Olp, MD  Evaluating Cardiologist: Rozann Lesches, MD   Chief Complaint  Patient presents with  . Atrial Fibrillation    History of Present Illness: Robert David is a 67 y.o. male presenting to establish cardiology follow-up in the [place] office. I reviewed extensive records and updated the chart.  He has been followed in the Atrial Fibrillation clinic, seen as recently as April, and underwent a cardioversion with rapid return to atrial fibrillation with plan to continue strategy of heart rate control and anticoagulation.  He has a long-standing history of PAF and more recently persistent atrial fibrillation.  He presents today with his wife for follow-up.  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  Reports no chest pain or definitive sense of palpitations, has NYHA class I-II dyspnea.  He is tolerating his current medications which are outlined below.  CHADSVASC score is 1.  We are continuing Eliquis.  Past Medical History:  Diagnosis Date  . Headache(784.0)   . Hyperlipidemia   . Persistent atrial fibrillation ([place])   . Seasonal allergies     Past Surgical History:  Procedure Laterality Date  . CARDIOVERSION N/A 05/12/2017   Procedure: CARDIOVERSION;  Surgeon: Fay Records, MD;  Location: [facility] ENDOSCOPY;  Service: Cardiovascular;  Laterality: N/A;  . COLONOSCOPY    . LEFT HEART CATHETERIZATION WITH CORONARY ANGIOGRAM N/A 07/19/2012   Procedure: LEFT HEART CATHETERIZATION WITH CORONARY ANGIOGRAM;  Surgeon: Minus Breeding, MD;  Location: [facility] CATH LAB;  Service: Cardiovascular;  Laterality: N/A;  . ORIF ANKLE FRACTURE Right 02/17/2016   Procedure: OPEN REDUCTION INTERNAL FIXATION (ORIF) BIMALLEOLAR ANKLE FRACTURE;  Surgeon: Leandrew Koyanagi, MD;  Location: [place];  Service: Orthopedics;  Laterality: Right;  . POLYPECTOMY      Current  Outpatient Medications  Medication Sig Dispense Refill  . apixaban (ELIQUIS) 5 MG TABS tablet Take 1 tablet (5 mg total) by mouth 2 (two) times daily. 60 tablet 6  . diltiazem (DILT-XR) 120 MG 24 hr capsule TAKE 1 CAPSULE(120 MG) BY MOUTH DAILY 30 capsule 3   No current facility-administered medications for this visit.    Allergies:  Patient has no known allergies.   Social History: The patient  reports that he quit smoking about 28 years ago. His smoking use included cigarettes. He has a 10.00 pack-year smoking history. He has never used smokeless tobacco. He reports that he does not drink alcohol or use drugs.   ROS:  Please see the history of present illness. Otherwise, complete review of systems is positive for none.  All other systems are reviewed and negative.   Physical Exam: VS:  BP 133/77 (BP Location: Right Arm)   Pulse 86   Ht 5\' 11"  (1.803 m)   Wt 207 lb (93.9 kg)   SpO2 96%   BMI 28.87 kg/m , BMI Body mass index is 28.87 kg/m.  Wt Readings from Last 3 Encounters:  06/13/17 207 lb (93.9 kg)  06/06/17 207 lb (93.9 kg)  05/22/17 201 lb (91.2 kg)    General: Patient appears comfortable at rest. HEENT: Conjunctiva and lids normal, oropharynx clear. Neck: Supple, no elevated JVP or carotid bruits, no thyromegaly. Lungs: Clear to auscultation, nonlabored breathing at rest. Cardiac: Regular rate and rhythm, no S3 or significant systolic murmur, no pericardial rub. Abdomen: Soft, nontender, bowel sounds present. Extremities: No pitting  edema, distal pulses 2+. Skin: Warm and dry. Musculoskeletal: No kyphosis. Neuropsychiatric: Alert and oriented x3, affect grossly appropriate.  ECG: I personally reviewed the tracing from 05/22/2017 which showed atrial fibrillation.  Recent Labwork: 05/12/2017: BUN 13; Creatinine, Ser 1.04; Hemoglobin 14.7; Platelets 256; Potassium 3.3; Sodium 142     Component Value Date/Time   CHOL 206 (H) 03/14/2016 0933   TRIG 101.0 03/14/2016 0933     HDL 44.50 03/14/2016 0933   CHOLHDL 5 03/14/2016 0933   VLDL 20.2 03/14/2016 0933   LDLCALC 141 (H) 03/14/2016 0933   LDLDIRECT 141.3 03/23/2009 1148    Other Studies Reviewed Today:  Echocardiogram 04/03/2017: Study Conclusions  - Left ventricle: The cavity size was normal. Wall thickness was   normal. Systolic function was normal. The estimated ejection   fraction was in the range of 50% to 55%. Wall motion was normal;   there were no regional wall motion abnormalities. - Right atrium: The atrium was mildly dilated.  Assessment and Plan:  1.  Persistent atrial fibrillation with CHADSVASC score of 1.  He underwent cardioversion attempt with fairly rapid return to atrial fibrillation and the plan is to continue with strategy of heart rate control and anticoagulation as he is tolerating the rhythm well.  Heart rate control is adequate at rest.  Follow-up in 6 months with CBC and BMET.  2.  Diet managed hyperlipidemia.  Current medicines were reviewed with the patient today.   Orders Placed This Encounter  Procedures  . CBC  . Basic metabolic panel    Disposition: Follow-up in 6 months.  Signed, Satira Sark, MD, [facility] 06/13/2017 1:26 PM    [facility] [place] [place], [place], [place] [address] Phone: [phone]; Fax: [phone]

## 2017-06-13 ENCOUNTER — Ambulatory Visit: Payer: PRIVATE HEALTH INSURANCE | Admitting: Cardiology

## 2017-06-13 ENCOUNTER — Encounter: Payer: Self-pay | Admitting: Cardiology

## 2017-06-13 VITALS — BP 133/77 | HR 86 | Ht 71.0 in | Wt 207.0 lb

## 2017-06-13 DIAGNOSIS — E782 Mixed hyperlipidemia: Secondary | ICD-10-CM | POA: Diagnosis not present

## 2017-06-13 DIAGNOSIS — I481 Persistent atrial fibrillation: Secondary | ICD-10-CM

## 2017-06-13 DIAGNOSIS — I4819 Other persistent atrial fibrillation: Secondary | ICD-10-CM

## 2017-06-13 NOTE — Patient Instructions (Signed)
Medication Instructions:  Your physician recommends that you continue on your current medications as directed. Please refer to the Current Medication list given to you today.  Labwork:  CBC/BMET  To be done in 6 months  Orders given today  Testing/Procedures: NONE  Follow-Up: Your physician wants you to follow-up in: [place] DR. Domenic Polite You will receive a reminder letter in the mail two months in advance. If you don't receive a letter, please call our office to schedule the follow-up appointment.  Any Other Special Instructions Will Be Listed Below (If Applicable).  If you need a refill on your cardiac medications before your next appointment, please call your pharmacy.

## 2017-06-22 ENCOUNTER — Encounter: Payer: Self-pay | Admitting: Family Medicine

## 2017-06-22 ENCOUNTER — Ambulatory Visit: Payer: PRIVATE HEALTH INSURANCE | Admitting: Family Medicine

## 2017-06-22 VITALS — BP 112/78 | HR 73 | Temp 98.3°F | Ht 71.0 in | Wt 200.4 lb

## 2017-06-22 DIAGNOSIS — R739 Hyperglycemia, unspecified: Secondary | ICD-10-CM

## 2017-06-22 DIAGNOSIS — E785 Hyperlipidemia, unspecified: Secondary | ICD-10-CM

## 2017-06-22 DIAGNOSIS — R351 Nocturia: Secondary | ICD-10-CM | POA: Diagnosis not present

## 2017-06-22 DIAGNOSIS — Z125 Encounter for screening for malignant neoplasm of prostate: Secondary | ICD-10-CM

## 2017-06-22 DIAGNOSIS — M7041 Prepatellar bursitis, right knee: Secondary | ICD-10-CM

## 2017-06-22 DIAGNOSIS — N401 Enlarged prostate with lower urinary tract symptoms: Secondary | ICD-10-CM

## 2017-06-22 NOTE — Assessment & Plan Note (Signed)
S:  controlled on no rx last year. Weight up about 8 lbs in that time. Stress eating has been an issue for him Lab Results  Component Value Date   HGBA1C 5.8 03/14/2016   HGBA1C 5.7 03/09/2015   HGBA1C 5.7 09/01/2014   A/P: update a1c

## 2017-06-22 NOTE — Assessment & Plan Note (Signed)
Much improved today after injection with steroid last visit after aspiration. Starting to swell up some- encouraged him to consistently use ace wrap and avoid trauma. He asks about another steroid injection but we discussed too early for this

## 2017-06-22 NOTE — Progress Notes (Signed)
Subjective:  Robert David is a 68 y.o. year old very pleasant male patient who presents for/with See problem oriented charting ROS- No chest pain or shortness of breath. No headache or blurry vision. Some swelling bursa right knee   Past Medical History-  Patient Active Problem List   Diagnosis Date Noted  . Atrial fibrillation ([place]) 05/26/2009    Priority: High  . BPH associated with nocturia 06/22/2017    Priority: Medium  . Hyperglycemia 09/01/2014    Priority: Medium  . Elevated blood pressure (not hypertension) 09/01/2014    Priority: Medium  . Insomnia 02/24/2014    Priority: Medium  . Diastolic dysfunction 98/33/8250    Priority: Medium  . Hyperlipidemia 05/11/2007    Priority: Medium  . Contact dermatitis 09/01/2014    Priority: Low  . Former smoker 02/24/2014    Priority: Low  . Chest pain 07/18/2012    Priority: Low  . GERD (gastroesophageal reflux disease) 07/18/2012    Priority: Low  . Prepatellar bursitis, right knee 06/22/2017  . Displaced bimalleolar fracture of right lower leg, subsequent encounter for closed fracture with delayed healing 02/12/2016    Medications- reviewed and updated Current Outpatient Medications  Medication Sig Dispense Refill  . apixaban (ELIQUIS) 5 MG TABS tablet Take 1 tablet (5 mg total) by mouth 2 (two) times daily. 60 tablet 6  . diltiazem (DILT-XR) 120 MG 24 hr capsule TAKE 1 CAPSULE(120 MG) BY MOUTH DAILY 30 capsule 3   Objective: BP 112/78 (BP Location: Left Arm, Patient Position: Sitting, Cuff Size: Normal)   Pulse 73   Temp 98.3 F (36.8 C) (Oral)   Ht 5\' 11"  (1.803 m)   Wt 200 lb 6.4 oz (90.9 kg)   SpO2 96%   BMI 27.95 kg/m  Gen: NAD, resting comfortably CV: irregularly irregular no murmurs rubs or gallops Lungs: CTAB no crackles, wheeze, rhonchi Abdomen: soft/nontender/nondistended/normal bowel sounds.  Ext: no edema Skin: warm, dry MSK: slight edema in prepatellar bursa without warmth or  erythema  Assessment/Plan:  Hyperglycemia S:  controlled on no rx last year. Weight up about 8 lbs in that time. Stress eating has been an issue for him Lab Results  Component Value Date   HGBA1C 5.8 03/14/2016   HGBA1C 5.7 03/09/2015   HGBA1C 5.7 09/01/2014   A/P: update a1c  Hyperlipidemia S: mild poorly controlled on no rx Lab Results  Component Value Date   CHOL 206 (H) 03/14/2016   HDL 44.50 03/14/2016   LDLCALC 141 (H) 03/14/2016   LDLDIRECT 141.3 03/23/2009   TRIG 101.0 03/14/2016   CHOLHDL 5 03/14/2016   A/P: update lipids and 10 year ascvd risk  BPH associated with nocturia S: 1-2x a night nocturia. BPH noted on exam last year Lab Results  Component Value Date   PSA 1.94 03/14/2016   PSA 1.59 03/09/2015   PSA 1.81 02/24/2014  A/P:update prostate cancer screening with PSA today. Discussed rectal- opts out for now- may repeat next year  Prepatellar bursitis, right knee Much improved today after injection with steroid last visit after aspiration. Starting to swell up some- encouraged him to consistently use ace wrap and avoid trauma. He asks about another steroid injection but we discussed too early for this   Future Appointments  Date Time Provider [place]  06/26/2017  9:30 AM [facility]   Need to make sure to see each other at least yearly  Lab/Order associations: Hyperlipidemia, unspecified hyperlipidemia type - Plan: Comprehensive metabolic panel, CBC,  Lipid panel  Hyperglycemia - Plan: Hemoglobin A1c  Screening for prostate cancer - Plan: PSA  BPH associated with nocturia  Prepatellar bursitis, right knee  Return precautions advised.  Garret Reddish, MD

## 2017-06-22 NOTE — Patient Instructions (Addendum)
Schedule a lab visit at the check out desk within 2 weeks. Return for future fasting labs meaning nothing but water after midnight please. Ok to take your medications with water.   Get back to ace wraps on the knee. Would need to wait at least 3 months for another steroid shot.   No changes today

## 2017-06-22 NOTE — Assessment & Plan Note (Signed)
S: mild poorly controlled on no rx Lab Results  Component Value Date   CHOL 206 (H) 03/14/2016   HDL 44.50 03/14/2016   LDLCALC 141 (H) 03/14/2016   LDLDIRECT 141.3 03/23/2009   TRIG 101.0 03/14/2016   CHOLHDL 5 03/14/2016   A/P: update lipids and 10 year ascvd risk

## 2017-06-22 NOTE — Assessment & Plan Note (Signed)
S: 1-2x a night nocturia. BPH noted on exam last year Lab Results  Component Value Date   PSA 1.94 03/14/2016   PSA 1.59 03/09/2015   PSA 1.81 02/24/2014  A/P:update prostate cancer screening with PSA today. Discussed rectal- opts out for now- may repeat next year

## 2017-06-26 ENCOUNTER — Encounter: Payer: Self-pay | Admitting: Family Medicine

## 2017-06-26 ENCOUNTER — Other Ambulatory Visit (INDEPENDENT_AMBULATORY_CARE_PROVIDER_SITE_OTHER): Payer: PRIVATE HEALTH INSURANCE

## 2017-06-26 DIAGNOSIS — Z125 Encounter for screening for malignant neoplasm of prostate: Secondary | ICD-10-CM

## 2017-06-26 DIAGNOSIS — E785 Hyperlipidemia, unspecified: Secondary | ICD-10-CM

## 2017-06-26 DIAGNOSIS — R739 Hyperglycemia, unspecified: Secondary | ICD-10-CM

## 2017-06-26 LAB — COMPREHENSIVE METABOLIC PANEL
ALT: 17 U/L (ref 0–53)
AST: 12 U/L (ref 0–37)
Albumin: 4.1 g/dL (ref 3.5–5.2)
Alkaline Phosphatase: 46 U/L (ref 39–117)
BUN: 14 mg/dL (ref 6–23)
CHLORIDE: 107 meq/L (ref 96–112)
CO2: 25 meq/L (ref 19–32)
CREATININE: 0.88 mg/dL (ref 0.40–1.50)
Calcium: 9 mg/dL (ref 8.4–10.5)
GFR: 91.39 mL/min (ref >60.00)
GLUCOSE: 102 mg/dL — AB (ref 70–99)
Potassium: 4.1 mEq/L (ref 3.5–5.1)
SODIUM: 140 meq/L (ref 135–145)
Total Bilirubin: 0.5 mg/dL (ref 0.2–1.2)
Total Protein: 6.3 g/dL (ref 6.0–8.3)

## 2017-06-26 LAB — LIPID PANEL
CHOLESTEROL: 157 mg/dL (ref 0–200)
HDL: 40.4 mg/dL (ref >39.00)
LDL Cholesterol: 101 mg/dL — ABNORMAL HIGH (ref 0–99)
NONHDL: 116.8
Total CHOL/HDL Ratio: 4
Triglycerides: 80 mg/dL (ref 0.0–149.0)
VLDL: 16 mg/dL (ref 0.0–40.0)

## 2017-06-26 LAB — CBC
HCT: 44.5 % (ref 39.0–52.0)
Hemoglobin: 14.6 g/dL (ref 13.0–17.0)
MCHC: 32.7 g/dL (ref 30.0–36.0)
MCV: 86.2 fl (ref 78.0–100.0)
Platelets: 229 10*3/uL (ref 150.0–400.0)
RBC: 5.16 Mil/uL (ref 4.22–5.81)
RDW: 14 % (ref 11.5–15.5)
WBC: 5.1 10*3/uL (ref 4.0–10.5)

## 2017-06-26 LAB — HEMOGLOBIN A1C: HEMOGLOBIN A1C: 5.8 % (ref 4.6–6.5)

## 2017-06-26 LAB — PSA: PSA: 1.67 ng/mL (ref 0.10–4.00)

## 2017-06-26 NOTE — Progress Notes (Signed)
Your CBC was normal (blood counts, infection fighting cells, platelets). Your CMET was largely normal (kidney, liver, and electrolytes, blood sugar). At risk for diabetes with blood sugar 102 (at risk from 100-125). Healthy eating, regular exercise, weight loss advised. Your hemoglobin A1c remains at risk for diabetes at 5.8 which is stable from a year ago. Your cholesterol has improved with your recent weight loss.  Your bad cholesterol is down from 141 all the way to 101 with goal being under 100.  I would ask you to continue your efforts for healthy eating and regular exercise and recheck in 1 year. Your PSA is down some from a year ago.  We can continue to watch this yearly.

## 2017-07-10 ENCOUNTER — Ambulatory Visit: Payer: PRIVATE HEALTH INSURANCE | Admitting: Cardiology

## 2017-08-09 ENCOUNTER — Other Ambulatory Visit (HOSPITAL_COMMUNITY): Payer: Self-pay | Admitting: Nurse Practitioner

## 2017-09-15 ENCOUNTER — Ambulatory Visit: Payer: PRIVATE HEALTH INSURANCE | Admitting: Physician Assistant

## 2017-09-15 ENCOUNTER — Encounter: Payer: Self-pay | Admitting: Physician Assistant

## 2017-09-15 ENCOUNTER — Ambulatory Visit (INDEPENDENT_AMBULATORY_CARE_PROVIDER_SITE_OTHER): Payer: PRIVATE HEALTH INSURANCE

## 2017-09-15 VITALS — BP 132/82 | HR 84 | Temp 98.1°F | Ht 71.0 in | Wt 209.4 lb

## 2017-09-15 DIAGNOSIS — R059 Cough, unspecified: Secondary | ICD-10-CM

## 2017-09-15 DIAGNOSIS — R05 Cough: Secondary | ICD-10-CM

## 2017-09-15 MED ORDER — BENZONATATE 100 MG PO CAPS: 100.0000 mg | ORAL_CAPSULE | Freq: Two times a day (BID) | ORAL | 0 refills | Status: DC | PRN

## 2017-09-15 MED ORDER — AMOXICILLIN-POT CLAVULANATE 875-125 MG PO TABS: 1.0000 | ORAL_TABLET | Freq: Two times a day (BID) | ORAL | 0 refills | Status: DC

## 2017-09-15 NOTE — Patient Instructions (Signed)
It was great to see you!  You have a viral upper respiratory infection. Antibiotics are not needed for this.  Viral infections usually take 7-10 days to resolve.  The cough can last a few weeks to go away.  Use medication as prescribed: Tessalon perles (cough medicine)  If you don't feel improved with symptomatic treatment, start the oral antibiotic as take as directed.  Push fluids and get plenty of rest. Please return if you are not improving as expected, or if you have high fevers (>101.5) or difficulty swallowing or worsening productive cough.  Call clinic with questions.  I hope you start feeling better soon!

## 2017-09-15 NOTE — Progress Notes (Signed)
Robert David is a 67 y.o. male here for a new problem.  History of Present Illness:   Chief Complaint  Patient presents with  . Acute Visit    HPI   Patient endorses cough x 1.5 weeks. Slightly productive with clear sputum. 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  Took an allergy medication without relief.  Eating and drinking well.  Does endorse fatigue.  Denies chest pain, shortness of breath, fevers.  [place] will be gone until Wednesday.  Past Medical History:  Diagnosis Date  . Headache(784.0)   . Hyperlipidemia   . Persistent atrial fibrillation ([place])   . Seasonal allergies      Social History   Socioeconomic History  . Marital status: Married    Spouse name: Not on file  . Number of children: Not on file  . Years of education: Not on file  . Highest education level: Not on file  Occupational History  . Not on file  Social Needs  . Financial resource strain: Not on file  . Food insecurity:    Worry: Not on file    Inability: Not on file  . Transportation needs:    Medical: Not on file    Non-medical: Not on file  Tobacco Use  . Smoking status: Former Smoker    Packs/day: 1.00    Years: 10.00    Pack years: 10.00    Types: Cigarettes    Last attempt to quit: 10/24/1988    Years since quitting: 28.9  . Smokeless tobacco: Never Used  Substance and Sexual Activity  . Alcohol use: No    Alcohol/week: 0.0 standard drinks  . Drug use: No  . Sexual activity: Not on file  Lifestyle  . Physical activity:    Days per week: Not on file    Minutes per session: Not on file  . Stress: Not on file  Relationships  . Social connections:    Talks on phone: Not on file    Gets together: Not on file    Attends religious service: Not on file    Active member of club or organization: Not on file    Attends meetings of clubs or organizations: Not on file    Relationship status: Not on file  . Intimate partner violence:   Fear of current or ex partner: Not on file    Emotionally abused: Not on file    Physically abused: Not on file    Forced sexual activity: Not on file  Other Topics Concern  . Not on file  Social History Narrative   Married (wife Robert David (Robert David) also Robert David patient) [phone]. 3 step children. 3 stepgrandchildren.       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       Hobbies: bowling, golf, exercise     Past Surgical History:  Procedure Laterality Date  . CARDIOVERSION N/A 05/12/2017   Procedure: CARDIOVERSION;  Surgeon: Fay Records, MD;  Location: [facility] ENDOSCOPY;  Service: Cardiovascular;  Laterality: N/A;  . COLONOSCOPY    . LEFT HEART CATHETERIZATION WITH CORONARY ANGIOGRAM N/A 07/19/2012   Procedure: LEFT HEART CATHETERIZATION WITH CORONARY ANGIOGRAM;  Surgeon: Minus Breeding, MD;  Location: [facility] CATH LAB;  Service: Cardiovascular;  Laterality: N/A;  . ORIF ANKLE FRACTURE Right 02/17/2016   Procedure: OPEN REDUCTION INTERNAL FIXATION (ORIF) BIMALLEOLAR ANKLE FRACTURE;  Surgeon: Leandrew Koyanagi, MD;  Location: [facility];  Service: Orthopedics;  Laterality: Right;  . POLYPECTOMY      Family History  Problem Relation Age of Onset  . Cancer Mother   . Breast cancer Mother   . Heart disease Father        hx A-Fib, 20 with MI  . Colon cancer Father 24       metastatic to lung  . Rectal cancer Neg Hx   . Stomach cancer Neg Hx     No Known Allergies  Current Medications:   Current Outpatient Medications:  .  apixaban (ELIQUIS) 5 MG TABS tablet, Take 1 tablet (5 mg total) by mouth 2 (two) times daily., Disp: 60 tablet, Rfl: 6 .  diltiazem (DILT-XR) 120 MG 24 hr capsule, TAKE 1 CAPSULE(120 MG) BY MOUTH DAILY, Disp: 30 capsule, Rfl: 3 .  amoxicillin-clavulanate (AUGMENTIN) 875-125 MG tablet, Take 1 tablet by mouth 2 (two) times daily., Disp: 20 tablet, Rfl: 0 .  benzonatate (TESSALON) 100 MG capsule, Take 1 capsule (100 mg total) by mouth 2 (two) times daily as  needed for cough., Disp: 20 capsule, Rfl: 0   Review of Systems:   ROS  Negative unless otherwise specified per HPI.  Vitals:   Vitals:   09/15/17 1422  BP: 132/82  Pulse: 84  Temp: 98.1 F (36.7 C)  TempSrc: Oral  SpO2: 96%  Weight: 209 lb 6.4 oz (95 kg)  Height: 5\' 11"  (1.803 m)     Body mass index is 29.21 kg/m.  Physical Exam:   Physical Exam  Constitutional: He appears well-developed. He is cooperative.  Non-toxic appearance. He does not have a sickly appearance. He does not appear ill. No distress.  HENT:  Head: Normocephalic and atraumatic.  Right Ear: Tympanic membrane, external ear and ear canal normal. Tympanic membrane is not erythematous, not retracted and not bulging.  Left Ear: Tympanic membrane, external ear and ear canal normal. Tympanic membrane is not erythematous, not retracted and not bulging.  Nose: Mucosal edema and rhinorrhea present. Right sinus exhibits maxillary sinus tenderness and frontal sinus tenderness. Left sinus exhibits maxillary sinus tenderness and frontal sinus tenderness.  Mouth/Throat: Uvula is midline and mucous membranes are normal. Posterior oropharyngeal erythema present. No posterior oropharyngeal edema.  Eyes: Conjunctivae and lids are normal.  Neck: Trachea normal.  Cardiovascular: Normal rate, regular rhythm, S1 normal, S2 normal and normal heart sounds.  Pulmonary/Chest: Effort normal and breath sounds normal. He has no decreased breath sounds. He has no wheezes. He has no rhonchi. He has no rales.  Lymphadenopathy:    He has no cervical adenopathy.  Neurological: He is alert.  Skin: Skin is warm, dry and intact.  Psychiatric: He has a normal mood and affect. His speech is normal and behavior is normal.  Nursing note and vitals reviewed.   Chest xray: bronchitic changes, awaiting radiology read   Assessment and Plan:    Robert David was seen today for acute visit.  Diagnoses and all orders for this visit:  Cough -     DG  Chest 2 View; Future  Other orders -     amoxicillin-clavulanate (AUGMENTIN) 875-125 MG tablet; Take 1 tablet by mouth 2 (two) times daily. -     benzonatate (TESSALON) 100 MG capsule; Take 1 capsule (100 mg total) by mouth 2 (two) times daily as needed for cough.   No red flags on exam.  Will initiate Tessalon perles per orders. Did provide safety net prescription of Augmentin should symptoms not improve despite.  Will call patient when CXR results return. Discussed taking medications as prescribed. Reviewed return precautions including worsening fever, SOB, worsening cough or other concerns. Push fluids and rest. I recommend that patient follow-up if symptoms worsen or persist despite treatment x 7-10 days, sooner if needed.   . Reviewed expectations re: course of current medical issues. . Discussed self-management of symptoms. . Outlined signs and symptoms indicating need for more acute intervention. . Patient verbalized understanding and all questions were answered. . See orders for this visit as documented in the electronic medical record. . Patient received an After-Visit Summary.   Inda Coke, PA-C

## 2017-09-16 ENCOUNTER — Encounter: Payer: Self-pay | Admitting: Physician Assistant

## 2017-10-21 ENCOUNTER — Other Ambulatory Visit (HOSPITAL_COMMUNITY): Payer: Self-pay | Admitting: Nurse Practitioner

## 2017-12-11 ENCOUNTER — Other Ambulatory Visit (HOSPITAL_COMMUNITY): Payer: Self-pay | Admitting: Cardiology

## 2017-12-16 IMAGING — RF DG C-ARM 61-120 MIN
1 series · 3 of 3 positions shown · non-contrast
Comparison: None.

CLINICAL DATA: ORIF right ankle fracture

EXAM:
DG C-ARM 61-120 MIN; RIGHT ANKLE - COMPLETE 3+ VIEW

[Series 1: run · 3 of 3 slices shown]
[im 1/3]
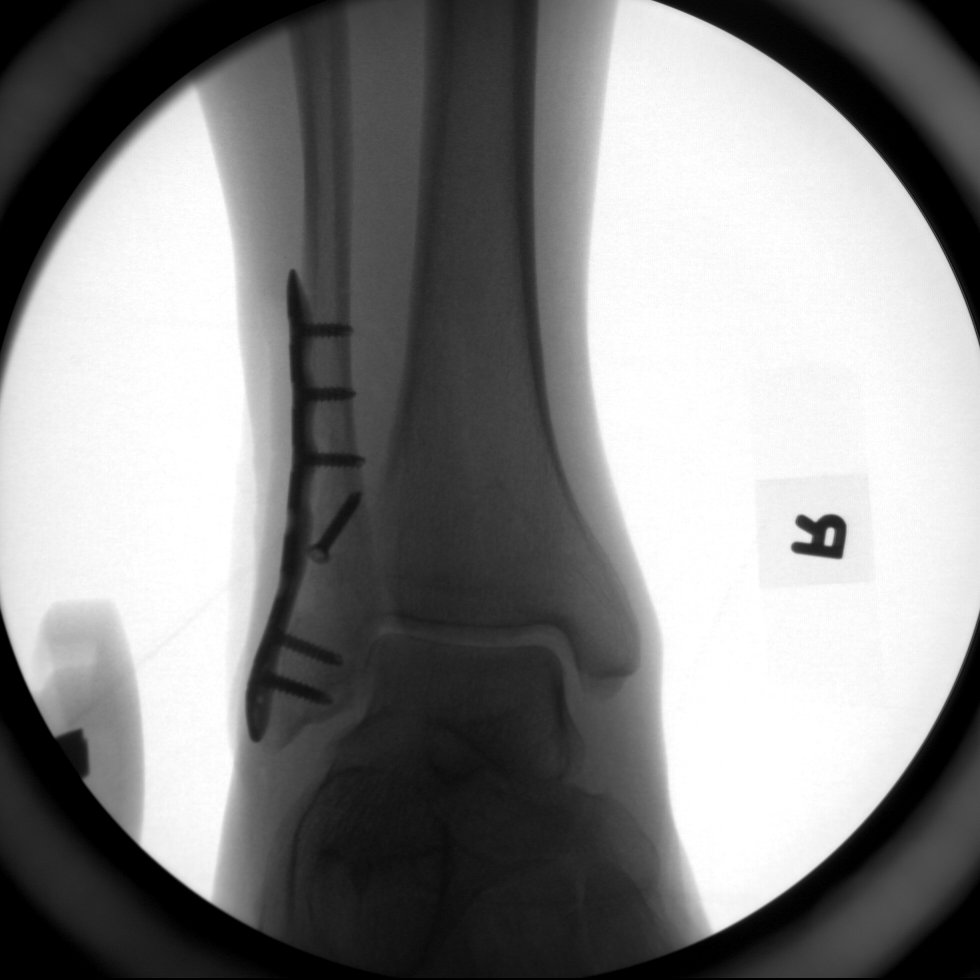
[im 2/3]
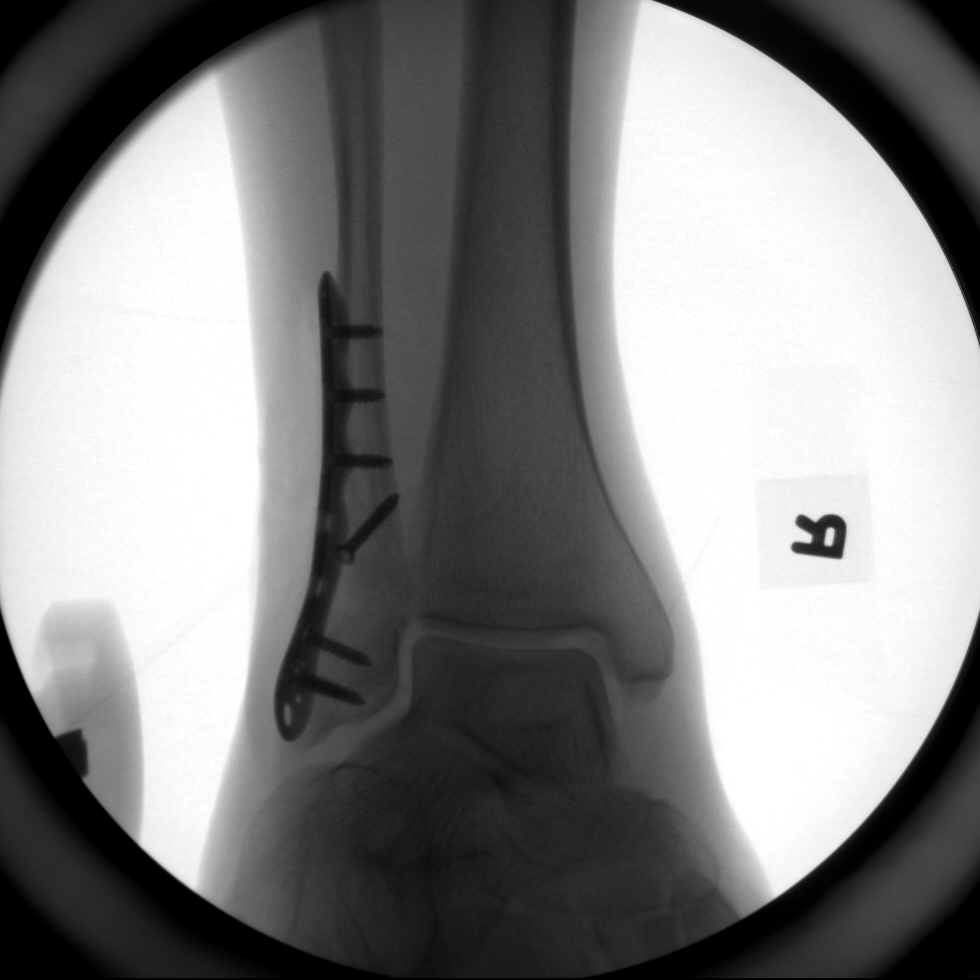
[im 3/3]
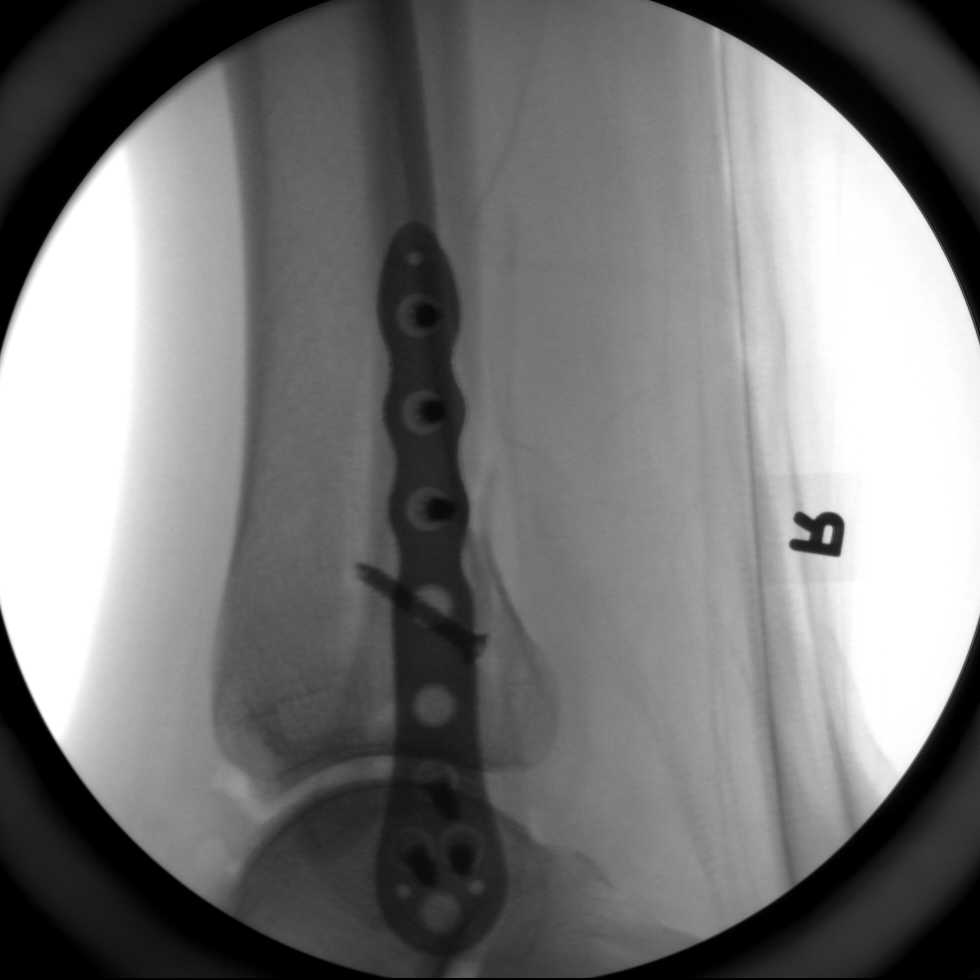

[3 of 3 positions shown; findings below may reference images not displayed]

FINDINGS: Three intraoperative spot images demonstrate plate and screw
fixation across a distal fibular fracture. No visible tibial
abnormality. Anatomic alignment.
IMPRESSION: Internal fixation of distal right fibula.  No complicating feature.

## 2017-12-16 IMAGING — RF DG ANKLE COMPLETE 3+V*R*
1 series · 3 of 3 positions shown · non-contrast
Comparison: None.

CLINICAL DATA: ORIF right ankle fracture

EXAM:
DG C-ARM 61-120 MIN; RIGHT ANKLE - COMPLETE 3+ VIEW

[Series 1: run · 3 of 3 slices shown]
[im 1/3]
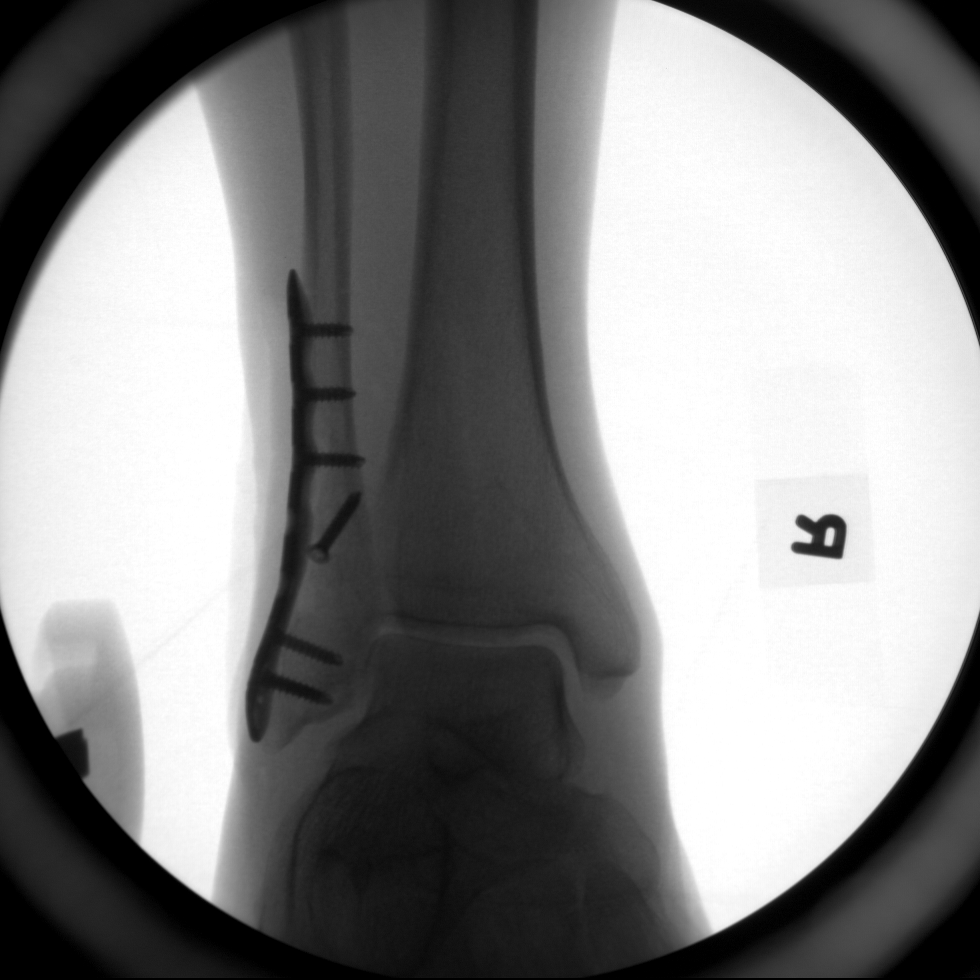
[im 2/3]
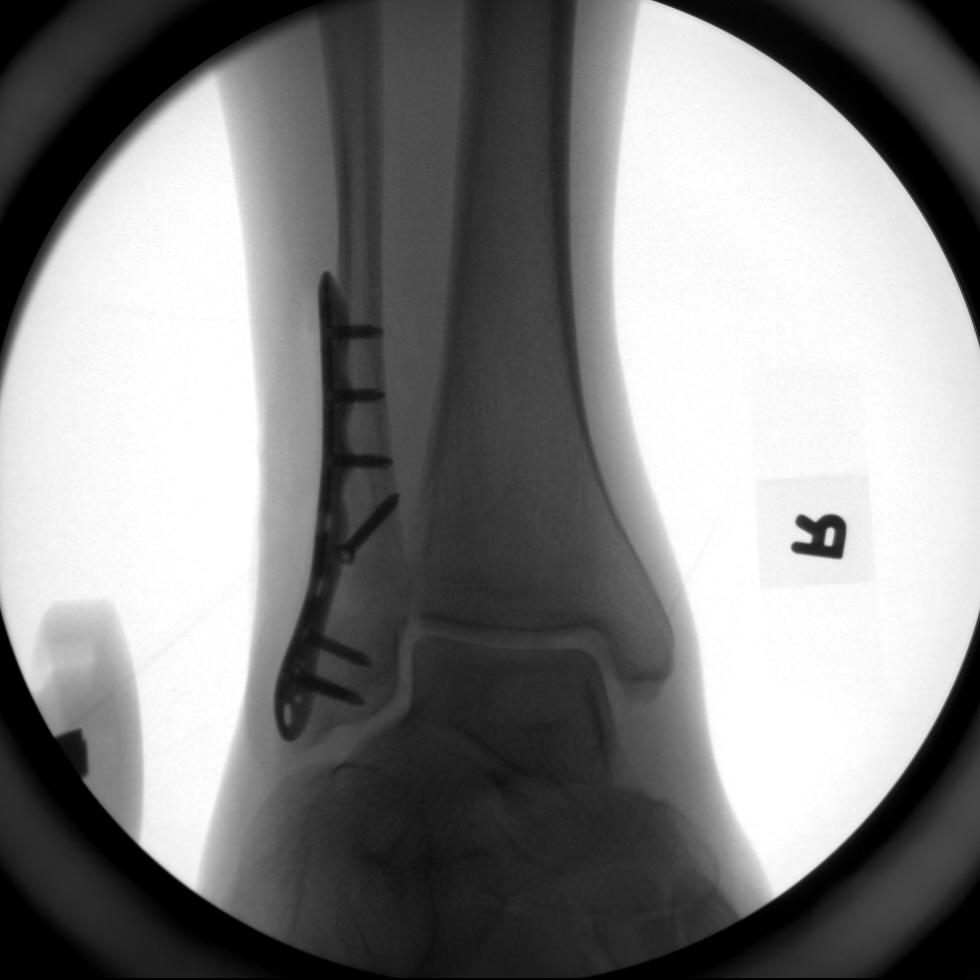
[im 3/3]
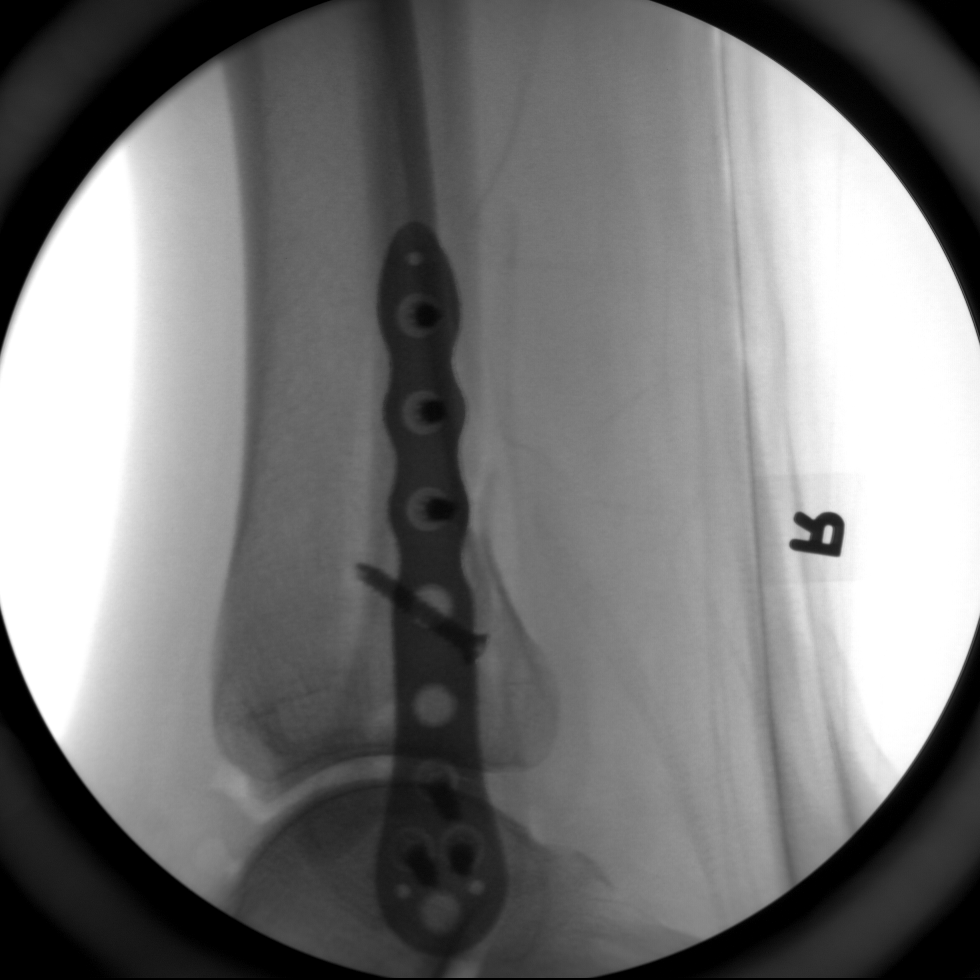

[3 of 3 positions shown; findings below may reference images not displayed]

FINDINGS: Three intraoperative spot images demonstrate plate and screw
fixation across a distal fibular fracture. No visible tibial
abnormality. Anatomic alignment.
IMPRESSION: Internal fixation of distal right fibula.  No complicating feature.

## 2017-12-29 NOTE — Progress Notes (Signed)
Cardiology Office Note  Date: 01/01/2018   ID: Robert David, DOB [birth year], MRN [id number]  PCP: Marin Olp, MD  Primary Cardiologist: Rozann Lesches, MD   Chief Complaint  Patient presents with  . Atrial Fibrillation    History of Present Illness: Robert David is a 67 y.o. male last seen in May.  He is here today with his wife for a follow-up visit.  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  He does report a sense of palpitations in the mornings but this tends to get better as the day goes on.  He has had no lightheadedness, chest pain, or syncope.  He is due for follow-up lab work on [employer].  Plans to get this obtained through his PCP.  Has had no bleeding problems or changes in stool.  I reviewed his medications and we discussed increasing his Cardizem CD to 180 mg daily.  Past Medical History:  Diagnosis Date  . Headache(784.0)   . Hyperlipidemia   . Persistent atrial fibrillation   . Seasonal allergies     Past Surgical History:  Procedure Laterality Date  . CARDIOVERSION N/A 05/12/2017   Procedure: CARDIOVERSION;  Surgeon: Fay Records, MD;  Location: [facility] ENDOSCOPY;  Service: Cardiovascular;  Laterality: N/A;  . COLONOSCOPY    . LEFT HEART CATHETERIZATION WITH CORONARY ANGIOGRAM N/A 07/19/2012   Procedure: LEFT HEART CATHETERIZATION WITH CORONARY ANGIOGRAM;  Surgeon: Minus Breeding, MD;  Location: [facility] CATH LAB;  Service: Cardiovascular;  Laterality: N/A;  . ORIF ANKLE FRACTURE Right 02/17/2016   Procedure: OPEN REDUCTION INTERNAL FIXATION (ORIF) BIMALLEOLAR ANKLE FRACTURE;  Surgeon: Leandrew Koyanagi, MD;  Location: [place];  Service: Orthopedics;  Laterality: Right;  . POLYPECTOMY      Current Outpatient Medications  Medication Sig Dispense Refill  . ELIQUIS 5 MG TABS tablet TAKE 1 TABLET(5 MG) BY MOUTH TWICE DAILY 60 tablet 6  . diltiazem (CARDIZEM CD) 180 MG 24 hr capsule Take 1 capsule (180 mg total) by mouth daily. 90  capsule 3   No current facility-administered medications for this visit.    Allergies:  Patient has no known allergies.   Social History: The patient  reports that he quit smoking about 29 years ago. His smoking use included cigarettes. He has a 10.00 pack-year smoking history. He has never used smokeless tobacco. He reports that he does not drink alcohol or use drugs.   ROS:  Please see the history of present illness. Otherwise, complete review of systems is positive for none.  All other systems are reviewed and negative.   Physical Exam: VS:  BP (!) 140/100   Pulse 87   Ht 5\' 11"  (1.803 m)   Wt 212 lb (96.2 kg)   SpO2 97%   BMI 29.57 kg/m , BMI Body mass index is 29.57 kg/m.  Wt Readings from Last 3 Encounters:  01/01/18 212 lb (96.2 kg)  09/15/17 209 lb 6.4 oz (95 kg)  06/22/17 200 lb 6.4 oz (90.9 kg)    General: Patient appears comfortable at rest. HEENT: Conjunctiva and lids normal, oropharynx clear. Neck: Supple, no elevated JVP or carotid bruits, no thyromegaly. Lungs: Clear to auscultation, nonlabored breathing at rest. Cardiac: Irregularly irregular, no S3 or significant systolic murmur. Abdomen: Soft, nontender, bowel sounds present. Extremities: No pitting edema, distal pulses 2+. Skin: Warm and dry. Musculoskeletal: No kyphosis. Neuropsychiatric: Alert and oriented x3, affect grossly appropriate.  ECG: I personally reviewed the tracing from  05/22/2017 which showed atrial fibrillation.  Recent Labwork: 06/26/2017: ALT 17; AST 12; BUN 14; Creatinine, Ser 0.88; Hemoglobin 14.6; Platelets 229.0; Potassium 4.1; Sodium 140     Component Value Date/Time   CHOL 157 06/26/2017 0928   TRIG 80.0 06/26/2017 0928   HDL 40.40 06/26/2017 0928   CHOLHDL 4 06/26/2017 0928   VLDL 16.0 06/26/2017 0928   LDLCALC 101 (H) 06/26/2017 0928   LDLDIRECT 141.3 03/23/2009 1148    Other Studies Reviewed Today:  Echocardiogram 04/03/2017: Study Conclusions  - Left ventricle: The  cavity size was normal. Wall thickness was normal. Systolic function was normal. The estimated ejection fraction was in the range of 50% to 55%. Wall motion was normal; there were no regional wall motion abnormalities. - Right atrium: The atrium was mildly dilated.  Assessment and Plan:  1.  Permanent atrial fibrillation.  Plan is to continue strategy of heart rate control and anticoagulation.  CHADSVASC score is 1-2.  Reports no bleeding problems on Eliquis and will have lab work through PCP.  Increase Cardizem CD to 180 mg daily.  2.  Elevated blood pressure reading.  I asked him to keep an eye on this, he does have a cuff at home.  3.  Diet managed hyperlipidemia.  Current medicines were reviewed with the patient today.  Disposition: Follow-up in 6 months.  Signed, Satira Sark, MD, [facility] 01/01/2018 9:58 AM    [facility] [place], [place], [place] [address] Phone: [phone]; Fax: [phone]

## 2018-01-01 ENCOUNTER — Ambulatory Visit: Payer: PRIVATE HEALTH INSURANCE | Admitting: Cardiology

## 2018-01-01 ENCOUNTER — Encounter: Payer: Self-pay | Admitting: Cardiology

## 2018-01-01 VITALS — BP 140/100 | HR 87 | Ht 71.0 in | Wt 212.0 lb

## 2018-01-01 DIAGNOSIS — I4821 Permanent atrial fibrillation: Secondary | ICD-10-CM

## 2018-01-01 DIAGNOSIS — R030 Elevated blood-pressure reading, without diagnosis of hypertension: Secondary | ICD-10-CM

## 2018-01-01 DIAGNOSIS — E782 Mixed hyperlipidemia: Secondary | ICD-10-CM

## 2018-01-01 MED ORDER — DILTIAZEM HCL ER COATED BEADS 180 MG PO CP24: 180.0000 mg | ORAL_CAPSULE | Freq: Every day | ORAL | 3 refills | Status: DC

## 2018-01-01 NOTE — Patient Instructions (Addendum)
Medication Instructions:   Your physician has recommended you make the following change in your medication:   Start diltiazem 180 mg daily after you finish your 120 mg capsules.  Continue all other medications the same.  Labwork:  NONE-please have your family doctor send a copy of your lab work to Dr. Domenic Polite.  Testing/Procedures:  NONE  Follow-Up:  Your physician recommends that you schedule a follow-up appointment in: 6 months. You will receive a reminder letter in the mail in about 4 months reminding you to call and schedule your appointment. If you don't receive this letter, please contact our office.  Any Other Special Instructions Will Be Listed Below (If Applicable).  If you need a refill on your cardiac medications before your next appointment, please call your pharmacy.

## 2018-01-25 ENCOUNTER — Ambulatory Visit: Payer: PRIVATE HEALTH INSURANCE | Admitting: Family Medicine

## 2018-01-25 ENCOUNTER — Encounter: Payer: Self-pay | Admitting: Family Medicine

## 2018-01-25 ENCOUNTER — Other Ambulatory Visit: Payer: Self-pay | Admitting: Family Medicine

## 2018-01-25 VITALS — BP 142/90 | HR 73 | Temp 97.6°F | Ht 71.0 in | Wt 206.8 lb

## 2018-01-25 DIAGNOSIS — R5383 Other fatigue: Secondary | ICD-10-CM | POA: Diagnosis not present

## 2018-01-25 DIAGNOSIS — R7303 Prediabetes: Secondary | ICD-10-CM

## 2018-01-25 DIAGNOSIS — M546 Pain in thoracic spine: Secondary | ICD-10-CM

## 2018-01-25 DIAGNOSIS — E559 Vitamin D deficiency, unspecified: Secondary | ICD-10-CM | POA: Insufficient documentation

## 2018-01-25 LAB — CBC WITH DIFFERENTIAL/PLATELET
Basophils Absolute: 0 10*3/uL (ref 0.0–0.1)
Basophils Relative: 0.7 % (ref 0.0–3.0)
Eosinophils Absolute: 0.2 10*3/uL (ref 0.0–0.7)
Eosinophils Relative: 3.5 % (ref 0.0–5.0)
HCT: 47.9 % (ref 39.0–52.0)
Hemoglobin: 15.9 g/dL (ref 13.0–17.0)
LYMPHS ABS: 1.7 10*3/uL (ref 0.7–4.0)
Lymphocytes Relative: 32.2 % (ref 12.0–46.0)
MCHC: 33.1 g/dL (ref 30.0–36.0)
MCV: 86.7 fl (ref 78.0–100.0)
Monocytes Absolute: 0.5 10*3/uL (ref 0.1–1.0)
Monocytes Relative: 9 % (ref 3.0–12.0)
Neutro Abs: 2.9 10*3/uL (ref 1.4–7.7)
Neutrophils Relative %: 54.6 % (ref 43.0–77.0)
PLATELETS: 254 10*3/uL (ref 150.0–400.0)
RBC: 5.53 Mil/uL (ref 4.22–5.81)
RDW: 13.5 % (ref 11.5–15.5)
WBC: 5.3 10*3/uL (ref 4.0–10.5)

## 2018-01-25 LAB — COMPREHENSIVE METABOLIC PANEL
ALT: 27 U/L (ref 0–53)
AST: 19 U/L (ref 0–37)
Albumin: 4.8 g/dL (ref 3.5–5.2)
Alkaline Phosphatase: 44 U/L (ref 39–117)
BUN: 19 mg/dL (ref 6–23)
CALCIUM: 10 mg/dL (ref 8.4–10.5)
CO2: 29 mEq/L (ref 19–32)
Chloride: 107 mEq/L (ref 96–112)
Creatinine, Ser: 1.11 mg/dL (ref 0.40–1.50)
GFR: 69.79 mL/min (ref >60.00)
Glucose, Bld: 108 mg/dL — ABNORMAL HIGH (ref 70–99)
POTASSIUM: 4.6 meq/L (ref 3.5–5.1)
Sodium: 144 mEq/L (ref 135–145)
Total Bilirubin: 0.5 mg/dL (ref 0.2–1.2)
Total Protein: 7 g/dL (ref 6.0–8.3)

## 2018-01-25 LAB — HEMOGLOBIN A1C: Hgb A1c MFr Bld: 5.9 % (ref 4.6–6.5)

## 2018-01-25 LAB — TSH: TSH: 1.4 u[IU]/mL (ref 0.35–4.50)

## 2018-01-25 LAB — VITAMIN D 25 HYDROXY (VIT D DEFICIENCY, FRACTURES): VITD: 17.15 ng/mL — ABNORMAL LOW (ref 30.00–100.00)

## 2018-01-25 MED ORDER — CLOBETASOL PROPIONATE 0.05 % EX CREA: 1.0000 "application " | TOPICAL_CREAM | Freq: Two times a day (BID) | CUTANEOUS | 0 refills | Status: DC

## 2018-01-25 MED ORDER — CYCLOBENZAPRINE HCL 10 MG PO TABS: 10.0000 mg | ORAL_TABLET | Freq: Three times a day (TID) | ORAL | 1 refills | Status: DC | PRN

## 2018-01-25 MED ORDER — VITAMIN D (ERGOCALCIFEROL) 1.25 MG (50000 UNIT) PO CAPS: ORAL_CAPSULE | ORAL | 0 refills | Status: DC

## 2018-01-25 NOTE — Patient Instructions (Signed)
Flexeril given. This is a muscle relaxer and can make you really really drowsy. May start with 1/2 a tablet. Can take up to three times a day. Would also use heating pad and do some stretches. If not better or continues to hurt you, need to image and do further work up.   Muscle Strain A muscle strain is an injury that happens when a muscle is stretched longer than normal. This can happen during a fall, sports, or lifting. This can tear some muscle fibers. Usually, recovery from muscle strain takes 1-2 weeks. Complete healing normally takes 5-6 weeks. This condition is first treated with PRICE therapy. This involves:  Protecting your muscle from being injured again.  Resting your injured muscle.  Icing your injured muscle.  Applying pressure (compression) to your injured muscle. This may be done with a splint or elastic bandage.  Raising (elevating) your injured muscle. Your doctor may also recommend medicine for pain. Follow these instructions at home: If you have a splint:  Wear the splint as told by your doctor. Take it off only as told by your doctor.  Loosen the splint if your fingers or toes tingle, get numb, or turn cold and blue.  Keep the splint clean.  If the splint is not waterproof: ? Do not let it get wet. ? Cover it with a watertight covering when you take a bath or a shower. Managing pain, stiffness, and swelling   If directed, put ice on your injured area. ? If you have a removable splint, take it off as told by your doctor. ? Put ice in a plastic bag. ? Place a towel between your skin and the bag. ? Leave the ice on for 20 minutes, 2-3 times a day.  Move your fingers or toes often. This helps to avoid stiffness and lessen swelling.  Raise your injured area above the level of your heart while you are sitting or lying down.  Wear an elastic bandage as told by your doctor. Make sure it is not too tight. General instructions  Take over-the-counter and  prescription medicines only as told by your doctor.  Limit your activity. Rest your injured muscle as told by your doctor. Your doctor may say that gentle movements are okay.  If physical therapy was prescribed, do exercises as told by your doctor.  Do not put pressure on any part of the splint until it is fully hardened. This may take many hours.  Do not use any products that contain nicotine or tobacco, such as cigarettes and e-cigarettes. These can delay bone healing. If you need help quitting, ask your doctor.  Warm up before you exercise. This helps to prevent more muscle strains.  Ask your doctor when it is safe to drive if you have a splint.  Keep all follow-up visits as told by your doctor. This is important. Contact a doctor if:  You have more pain or swelling in your injured area. Get help right away if:  You have any of these problems in your injured area: ? You have numbness. ? You have tingling. ? You lose a lot of strength. Summary  A muscle strain is an injury that happens when a muscle is stretched longer than normal.  This condition is first treated with PRICE therapy. This includes protecting, resting, icing, adding pressure, and raising your injury.  Limit your activity. Rest your injured muscle as told by your doctor. Your doctor may say that gentle movements are okay.  Warm up  before you exercise. This helps to prevent more muscle strains. This information is not intended to replace advice given to you by your health care provider. Make sure you discuss any questions you have with your health care provider. Document Released: 11/03/2007 Document Revised: 03/02/2016 Document Reviewed: 03/02/2016 Elsevier Interactive Patient Education  2019 [employer].

## 2018-01-25 NOTE — Progress Notes (Signed)
Vit d 

## 2018-01-25 NOTE — Progress Notes (Signed)
Patient: Robert David MRN: [id number] DOB: Jan 18, 1951 PCP: Marin Olp, MD     Subjective:  Chief Complaint  Patient presents with  . L side rib/mid back pain    HPI: The patient is a 67 y.o. male who presents today for 3 week history of mid-lower left back pain. He has a history of back pain, but this is very different. Pain is described as fist sized and is a dull ache. Pain is constant. No radiation. Pain is a 3-4/10, but can get up to a 7/10. He has taken tylenol with no relief. Can not take any NSAIDs due to being on eliquis. He denies any trauma/falls. 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 He denies anything out of the normal over the past 3 weeks before this started. No fever/chills, no hx of stones, no issues with urination. He has no urinary symptoms/respiratory symptoms. No fever/chills. No weakness in his lower legs, no loss of sensation, no urinary incontinence.   He also has complaints of fatigue and just not feeling well. He just doesn't feel like himself and gets shaky at times. He feels like it could be stress. His cardiologist would also like his renal/liver function checked since on the eliquis. He denies any fever/night sweats, easy bruising, blood in stool. He sleeps well. He exercises 3x/week. He is eating well and lost 6 pounds intentionally as he had gained weight.   Review of Systems  Constitutional: Positive for fatigue. Negative for fever and unexpected weight change.  HENT: Negative for congestion, sinus pressure, sinus pain and sore throat.   Respiratory: Negative for cough, shortness of breath and wheezing.   Cardiovascular: Negative for chest pain.  Gastrointestinal: Negative for abdominal pain, blood in stool, diarrhea, nausea and vomiting.  Genitourinary: Negative for difficulty urinating, dysuria, flank pain, frequency, hematuria, testicular pain and urgency.  Musculoskeletal: Positive for back pain.       C/o left side  mid back pain    Allergies Patient has No Known Allergies.  Past Medical History Patient  has a past medical history of Headache(784.0), Hyperlipidemia, Persistent atrial fibrillation, and Seasonal allergies.  Surgical History Patient  has a past surgical history that includes left heart catheterization with coronary angiogram (N/A, 07/19/2012); Colonoscopy; Polypectomy; ORIF ankle fracture (Right, 02/17/2016); and Cardioversion (N/A, 05/12/2017).  Family History Pateint's family history includes Breast cancer in his mother; Cancer in his mother; Colon cancer (age of onset: 73) in his father; Heart disease in his father.  Social History Patient  reports that he quit smoking about 29 years ago. His smoking use included cigarettes. He has a 10.00 pack-year smoking history. He has never used smokeless tobacco. He reports that he does not drink alcohol or use drugs.    Objective: Vitals:   01/25/18 0759  BP: (!) 142/90  Pulse: 73  Temp: 97.6 F (36.4 C)  TempSrc: Oral  SpO2: 97%  Weight: 206 lb 12.8 oz (93.8 kg)  Height: 5\' 11"  (1.803 m)    Body mass index is 28.84 kg/m.  Physical Exam Vitals signs reviewed.  Constitutional:      General: He is not in acute distress.    Appearance: Normal appearance. He is not ill-appearing.  HENT:     Right Ear: Tympanic membrane, ear canal and external ear normal.     Left Ear: Tympanic membrane, ear canal and external ear normal.  Neck:     Musculoskeletal: Normal range of  motion and neck supple.  Cardiovascular:     Rate and Rhythm: Normal rate and regular rhythm.     Heart sounds: Normal heart sounds.  Pulmonary:     Effort: Pulmonary effort is normal.     Breath sounds: Normal breath sounds.  Abdominal:     General: Abdomen is flat. Bowel sounds are normal.     Palpations: Abdomen is soft.  Musculoskeletal: Normal range of motion.        General: No swelling.     Right lower leg: No edema.     Left lower leg: No edema.      Comments: No CVA tenderness. TTP over paraspinal muscle on left side around T10-T11 as well as lateral aspect of ribs.  Strength and sensation intact in lower legs.   Neurological:     Mental Status: He is alert.        Assessment/plan: 1. Acute left-sided thoracic back pain Appears to be a muscle strain. We are going to discuss conservative therapy with stretching, heating pad, tylenol. Offered voltaren gel since he can not take NSAIDs and he declined. Will do muscle relaxer prn. Drowsy precautions given. If not better in 1-2 weeks, he will f/u with PCP.   2. Prediabetes  - Hemoglobin A1c - Comprehensive metabolic panel  3. Other fatigue  - CBC with Differential/Platelet - TSH - VITAMIN D 25 Hydroxy (Vit-D Deficiency, Fractures)   Dr. Domenic Polite needs lab work. Will send to him when resulted.     Return if symptoms worsen or fail to improve.   Orma Flaming, MD [place]   01/25/2018

## 2018-04-25 ENCOUNTER — Ambulatory Visit: Payer: PRIVATE HEALTH INSURANCE | Admitting: Student

## 2018-04-25 ENCOUNTER — Other Ambulatory Visit: Payer: Self-pay

## 2018-04-25 ENCOUNTER — Encounter: Payer: Self-pay | Admitting: Student

## 2018-04-25 VITALS — BP 140/80 | HR 84 | Ht 71.0 in | Wt 207.0 lb

## 2018-04-25 DIAGNOSIS — I4821 Permanent atrial fibrillation: Secondary | ICD-10-CM

## 2018-04-25 DIAGNOSIS — E782 Mixed hyperlipidemia: Secondary | ICD-10-CM

## 2018-04-25 DIAGNOSIS — R600 Localized edema: Secondary | ICD-10-CM

## 2018-04-25 DIAGNOSIS — R030 Elevated blood-pressure reading, without diagnosis of hypertension: Secondary | ICD-10-CM | POA: Diagnosis not present

## 2018-04-25 NOTE — Progress Notes (Addendum)
Cardiology Office Note    Date:  04/25/2018   ID:  Robert David, DOB 02-26-1950, MRN [id number]  PCP:  Marin Olp, MD  Cardiologist: Rozann Lesches, MD    Chief Complaint  Patient presents with  . Follow-up    worsening edema    History of Present Illness:    Robert David is a 68 y.o. male with past medical history of permanent atrial fibrillation (on Eliquis), HTN, and HLD who presents to the officer today for evaluation of lower extremity edema .   He was last examined by Dr. Domenic Polite in 12/2017 and reported occasional palpitations but denied any associated dizziness or presyncope. Cardizem was increased from 120mg  daily to 180mg  daily to assist with his palpitations.  In talking with the patient today, he reports that his palpitations did improve with titration of Cardizem. He denies any recent lightheadedness, dizziness, r presyncope.  Starting approximately 2 months ago, he noticed worsening swelling along his ankles bilaterally. He did purchase compression stockings and has been utilizing these over the past week with improvement in his symptoms. He denies any associated dyspnea, chest pain, orthopnea, or PND.  Unaware of any recent weight gain and this has been stable by review of the office scales.  Does not add additional salt to his food but does consume fast food a few times per week.    Past Medical History:  Diagnosis Date  . Headache(784.0)   . Hyperlipidemia   . Persistent atrial fibrillation   . Seasonal allergies     Past Surgical History:  Procedure Laterality Date  . CARDIOVERSION N/A 05/12/2017   Procedure: CARDIOVERSION;  Surgeon: Fay Records, MD;  Location: [facility] ENDOSCOPY;  Service: Cardiovascular;  Laterality: N/A;  . COLONOSCOPY    . LEFT HEART CATHETERIZATION WITH CORONARY ANGIOGRAM N/A 07/19/2012   Procedure: LEFT HEART CATHETERIZATION WITH CORONARY ANGIOGRAM;  Surgeon: Minus Breeding, MD;  Location: [facility] CATH LAB;  Service: Cardiovascular;   Laterality: N/A;  . ORIF ANKLE FRACTURE Right 02/17/2016   Procedure: OPEN REDUCTION INTERNAL FIXATION (ORIF) BIMALLEOLAR ANKLE FRACTURE;  Surgeon: Leandrew Koyanagi, MD;  Location: [place];  Service: Orthopedics;  Laterality: Right;  . POLYPECTOMY      Current Medications: Outpatient Medications Prior to Visit  Medication Sig Dispense Refill  . diltiazem (CARDIZEM CD) 180 MG 24 hr capsule Take 1 capsule (180 mg total) by mouth daily. 90 capsule 3  . ELIQUIS 5 MG TABS tablet TAKE 1 TABLET(5 MG) BY MOUTH TWICE DAILY 60 tablet 6  . cyclobenzaprine (FLEXERIL) 10 MG tablet Take 1 tablet (10 mg total) by mouth 3 (three) times daily as needed for muscle spasms. 30 tablet 1  . Vitamin D, Ergocalciferol, (DRISDOL) 1.25 MG (50000 UT) CAPS capsule One capsule by mouth once a week for 12 weeks. Then take 2000IU/day 12 capsule 0  . clobetasol cream (TEMOVATE) 7.78 % Apply 1 application topically 2 (two) times daily. (Patient not taking: Reported on 04/25/2018) 60 g 0   No facility-administered medications prior to visit.      Allergies:   Patient has no known allergies.   Social History   Socioeconomic History  . Marital status: Married    Spouse name: Not on file  . Number of children: Not on file  . Years of education: Not on file  . Highest education level: Not on file  Occupational History  . Not on file  Social Needs  . Financial resource strain: Not on file  .  Food insecurity:    Worry: Not on file    Inability: Not on file  . Transportation needs:    Medical: Not on file    Non-medical: Not on file  Tobacco Use  . Smoking status: Former Smoker    Packs/day: 1.00    Years: 10.00    Pack years: 10.00    Types: Cigarettes    Last attempt to quit: 10/24/1988    Years since quitting: 29.5  . Smokeless tobacco: Never Used  Substance and Sexual Activity  . Alcohol use: No    Alcohol/week: 0.0 standard drinks  . Drug use: No  . Sexual activity: Not on file  Lifestyle   . Physical activity:    Days per week: Not on file    Minutes per session: Not on file  . Stress: Not on file  Relationships  . Social connections:    Talks on phone: Not on file    Gets together: Not on file    Attends religious service: Not on file    Active member of club or organization: Not on file    Attends meetings of clubs or organizations: Not on file    Relationship status: Not on file  Other Topics Concern  . Not on file  Social History Narrative   Married (wife [name] (bobby gail) also Dr. Yong Channel patient) [phone]. 3 step children. 3 stepgrandchildren.       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       Hobbies: bowling, golf, exercise      Family History:  The patient's family history includes Breast cancer in his mother; Cancer in his mother; Colon cancer (age of onset: 36) in his father; Heart disease in his father.   Review of Systems:   Please see the history of present illness.     General:  No chills, fever, night sweats or weight changes.  Cardiovascular:  No chest pain, dyspnea on exertion,  orthopnea, palpitations, paroxysmal nocturnal dyspnea. Positive for edema.  Dermatological: No rash, lesions/masses Respiratory: No cough, dyspnea Urologic: No hematuria, dysuria Abdominal:   No nausea, vomiting, diarrhea, bright red blood per rectum, melena, or hematemesis Neurologic:  No visual changes, wkns, changes in mental status. All other systems reviewed and are otherwise negative except as noted above.   Physical Exam:    VS:  BP 140/80 (BP Location: Right Arm)   Pulse 84   Ht 5\' 11"  (1.803 m)   Wt 207 lb (93.9 kg)   SpO2 98%   BMI 28.87 kg/m    General: Well developed, well nourished Caucasian male appearing in no acute distress. Head: Normocephalic, atraumatic, sclera non-icteric, no xanthomas, nares are without discharge.  Neck: No carotid bruits. JVD not elevated.  Lungs: Respirations regular and unlabored, without wheezes or rales.   Heart: Irregularly irregular. No S3 or S4.  No murmur, no rubs, or gallops appreciated. Abdomen: Soft, non-tender, non-distended with normoactive bowel sounds. No hepatomegaly. No rebound/guarding. No obvious abdominal masses. Msk:  Strength and tone appear normal for age. No joint deformities or effusions. Extremities: No clubbing or cyanosis. No lower extremity edema.  Distal pedal pulses are 2+ bilaterally. Neuro: Alert and oriented X 3. Moves all extremities spontaneously. No focal deficits noted. Psych:  Responds to questions appropriately with a normal affect. Skin: No rashes or lesions noted  Wt Readings from Last 3 Encounters:  04/25/18 207 lb (93.9 kg)  01/25/18 206 lb 12.8 oz (93.8 kg)  01/01/18 212  lb (96.2 kg)     Studies/Labs Reviewed:   EKG:  EKG is not ordered today.    Recent Labs: 01/25/2018: ALT 27; BUN 19; Creatinine, Ser 1.11; Hemoglobin 15.9; Platelets 254.0; Potassium 4.6; Sodium 144; TSH 1.40   Lipid Panel    Component Value Date/Time   CHOL 157 06/26/2017 0928   TRIG 80.0 06/26/2017 0928   HDL 40.40 06/26/2017 0928   CHOLHDL 4 06/26/2017 0928   VLDL 16.0 06/26/2017 0928   LDLCALC 101 (H) 06/26/2017 0928   LDLDIRECT 141.3 03/23/2009 1148    Additional studies/ records that were reviewed today include:   Echocardiogram: 04/03/2017 Study Conclusions  - Left ventricle: The cavity size was normal. Wall thickness was   normal. Systolic function was normal. The estimated ejection   fraction was in the range of 50% to 55%. Wall motion was normal;   there were no regional wall motion abnormalities. - Right atrium: The atrium was mildly dilated.   Assessment:    1. Permanent atrial fibrillation   2. Lower extremity edema   3. Elevated blood pressure reading   4. Mixed hyperlipidemia      Plan:   In order of problems listed above:  1. Permanent Atrial Fibrillation -Says that his palpitations significantly improved with titration of Cardizem  CD. Heart rate is well controlled in the 80's during today's visit. Continue Cardizem CD 180 mg daily for rate control. - He denies any evidence of active bleeding. Continue Eliquis 5 mg twice daily for anticoagulation.  2. Lower Extremity Edema - He reports intermittent swelling along his ankles but I do not appreciate any edema on examination. Says this has improved with use of compression stockings. Question if his symptoms were likely secondary to further titration of calcium channel blocker therapy as he started to notice the swelling around the time this was titrated. - Encouraged him to continue to monitor symptoms for now. He will continue to use compression stockings.  Also recommended limiting sodium intake. No indication for diuretic therapy at this time.   3. Elevated BP - BP is at 140/80 during today's visit but he reports this has overall been well-controlled when checked at home. Encouraged him to continue to follow in the ambulatory setting. Remains on Cardizem CD 180 mg daily.  4. HLD - Followed by PCP. FLP in 06/2017 showed total cholesterol 206, triglycerides 101, HDL 40, and LDL 101. He is not on statin therapy and continues with dietary modifications.   Medication Adjustments/Labs and Tests Ordered: Current medicines are reviewed at length with the patient today.  Concerns regarding medicines are outlined above.  Medication changes, Labs and Tests ordered today are listed in the Patient Instructions below. Patient Instructions  Medication Instructions:  Your physician recommends that you continue on your current medications as directed. Please refer to the Current Medication list given to you today.  If you need a refill on your cardiac medications before your next appointment, please call your pharmacy.   Lab work: NONE  If you have labs (blood work) drawn today and your tests are completely normal, you will receive your results only by: [name] Kitchen MyChart Message (if you have  MyChart) OR . A paper copy in the mail If you have any lab test that is abnormal or we need to change your treatment, we will call you to review the results.  Testing/Procedures: NONE   Follow-Up: At [facility], you and your health needs are our priority.  As part of our continuing  mission to provide you with exceptional heart care, we have created designated Provider Care Teams.  These Care Teams include your primary Cardiologist (physician) and Advanced Practice Providers (APPs -  Physician Assistants and Nurse Practitioners) who all work together to provide you with the care you need, when you need it. You will need a follow up appointment in 6 months.  Please call our office 2 months in advance to schedule this appointment.  You may see Rozann Lesches, MD or one of the following Advanced Practice Providers on your designated Care Team:   Bernerd Pho, PA-C [facility]) . Ermalinda Barrios, PA-C ([place])  Any Other Special Instructions Will Be Listed Below (If Applicable). Thank you for choosing [facility]!    Signed, Erma Heritage, PA-C  04/25/2018 5:52 PM    [facility] [address] [place], [place] [address] Phone: [phone] Fax: [phone]

## 2018-04-25 NOTE — Patient Instructions (Signed)
Medication Instructions:  Your physician recommends that you continue on your current medications as directed. Please refer to the Current Medication list given to you today.  If you need a refill on your cardiac medications before your next appointment, please call your pharmacy.   Lab work: NONE   If you have labs (blood work) drawn today and your tests are completely normal, you will receive your results only by: . MyChart Message (if you have MyChart) OR . A paper copy in the mail If you have any lab test that is abnormal or we need to change your treatment, we will call you to review the results.  Testing/Procedures: NONE   Follow-Up: At [facility], you and your health needs are our priority.  As part of our continuing mission to provide you with exceptional heart care, we have created designated Provider Care Teams.  These Care Teams include your primary Cardiologist (physician) and Advanced Practice Providers (APPs -  Physician Assistants and Nurse Practitioners) who all work together to provide you with the care you need, when you need it. You will need a follow up appointment in 6 months.  Please call our office 2 months in advance to schedule this appointment.  You may see Samuel McDowell, MD or one of the following Advanced Practice Providers on your designated Care Team:   Brittany Strader, PA-C ([place] Office) . Michele Lenze, PA-C ([place] Office)  Any Other Special Instructions Will Be Listed Below (If Applicable). Thank you for choosing [facility]!     

## 2018-05-20 ENCOUNTER — Other Ambulatory Visit (HOSPITAL_COMMUNITY): Payer: Self-pay | Admitting: Nurse Practitioner

## 2018-11-08 ENCOUNTER — Telehealth: Payer: Self-pay | Admitting: Cardiology

## 2018-11-08 NOTE — Telephone Encounter (Signed)

## 2018-11-27 ENCOUNTER — Telehealth (INDEPENDENT_AMBULATORY_CARE_PROVIDER_SITE_OTHER): Payer: PRIVATE HEALTH INSURANCE | Admitting: Cardiology

## 2018-11-27 ENCOUNTER — Encounter: Payer: Self-pay | Admitting: Cardiology

## 2018-11-27 VITALS — BP 128/78 | HR 80 | Ht 71.0 in | Wt 203.0 lb

## 2018-11-27 DIAGNOSIS — I4821 Permanent atrial fibrillation: Secondary | ICD-10-CM

## 2018-11-27 NOTE — Patient Instructions (Signed)
Medication Instructions:  Your physician recommends that you continue on your current medications as directed. Please refer to the Current Medication list given to you today.  *If you need a refill on your cardiac medications before your next appointment, please call your pharmacy*  Lab Work: None today If you have labs (blood work) drawn today and your tests are completely normal, you will receive your results only by: . MyChart Message (if you have MyChart) OR . A paper copy in the mail If you have any lab test that is abnormal or we need to change your treatment, we will call you to review the results.  Testing/Procedures: None today  Follow-Up: At [facility], you and your health needs are our priority.  As part of our continuing mission to provide you with exceptional heart care, we have created designated Provider Care Teams.  These Care Teams include your primary Cardiologist (physician) and Advanced Practice Providers (APPs -  Physician Assistants and Nurse Practitioners) who all work together to provide you with the care you need, when you need it.  Your next appointment:   6 months  The format for your next appointment:   In Person  Provider:   Samuel McDowell, MD  Other Instructions None     Thank you for choosing [facility] !         

## 2018-11-27 NOTE — Progress Notes (Signed)
Virtual Visit via Telephone Note   This visit type was conducted due to national recommendations for restrictions regarding the COVID-19 Pandemic (e.g. social distancing) in an effort to limit this patient's exposure and mitigate transmission in our community.  Due to his co-morbid illnesses, this patient is at least at moderate risk for complications without adequate follow up.  This format is felt to be most appropriate for this patient at this time.  The patient did not have access to video technology/had technical difficulties with video requiring transitioning to audio format only (telephone).  All issues noted in this document were discussed and addressed.  No physical exam could be performed with this format.  Please refer to the patient's chart for his  consent to telehealth for [facility].   Date:  11/27/2018    ID:  Robert David, DOB [birth year], MRN [id number]  Patient Location: Home Provider Location: Office  PCP:  Marin Olp, MD  Cardiologist:  Rozann Lesches, MD Electrophysiologist:  None   Evaluation Performed:  Follow-Up Visit  Chief Complaint:   Cardiac follow-up  History of Present Illness:    Robert David is a 68 y.o. male last seen in March by Ms. Strader PA-C.  We spoke by phone today.  He states that he has been doing well, no significant palpitations or chest pain.  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  We discussed follow-up lab work on [employer].  He plans to meet with his PCP to get this obtained.  He does not report any spontaneous bleeding problems.  Otherwise he is tolerating Cardizem CD well.  The patient does not have symptoms concerning for COVID-19 infection (fever, chills, cough, or new shortness of breath).    Past Medical History:  Diagnosis Date  . Headache(784.0)   . Hyperlipidemia   . Persistent atrial fibrillation ([place])   . Seasonal allergies    Past  Surgical History:  Procedure Laterality Date  . CARDIOVERSION N/A 05/12/2017   Procedure: CARDIOVERSION;  Surgeon: Fay Records, MD;  Location: [facility] ENDOSCOPY;  Service: Cardiovascular;  Laterality: N/A;  . COLONOSCOPY    . LEFT HEART CATHETERIZATION WITH CORONARY ANGIOGRAM N/A 07/19/2012   Procedure: LEFT HEART CATHETERIZATION WITH CORONARY ANGIOGRAM;  Surgeon: Minus Breeding, MD;  Location: [facility] CATH LAB;  Service: Cardiovascular;  Laterality: N/A;  . ORIF ANKLE FRACTURE Right 02/17/2016   Procedure: OPEN REDUCTION INTERNAL FIXATION (ORIF) BIMALLEOLAR ANKLE FRACTURE;  Surgeon: Leandrew Koyanagi, MD;  Location: [place];  Service: Orthopedics;  Laterality: Right;  . POLYPECTOMY       Current Meds  Medication Sig  . diltiazem (CARDIZEM CD) 180 MG 24 hr capsule Take 1 capsule (180 mg total) by mouth daily.  [name] Kitchen ELIQUIS 5 MG TABS tablet TAKE 1 TABLET(5 MG) BY MOUTH TWICE DAILY     Allergies:   Patient has no known allergies.   Social History   Tobacco Use  . Smoking status: Former Smoker    Packs/day: 1.00    Years: 10.00    Pack years: 10.00    Types: Cigarettes    Quit date: 10/24/1988    Years since quitting: 30.1  . Smokeless tobacco: Never Used  Substance Use Topics  . Alcohol use: No    Alcohol/week: 0.0 standard drinks  . Drug use: No     Family Hx: The patient's family history includes Breast cancer in his mother; Cancer in his  mother; Colon cancer (age of onset: 30) in his father; Heart disease in his father. There is no history of Rectal cancer or Stomach cancer.  ROS:   Please see the history of present illness. All other systems reviewed and are negative.   Prior CV studies:   The following studies were reviewed today:  Echocardiogram 04/03/2017: Study Conclusions  - Left ventricle: The cavity size was normal. Wall thickness was   normal. Systolic function was normal. The estimated ejection   fraction was in the range of 50% to 55%. Wall motion was  normal;   there were no regional wall motion abnormalities. - Right atrium: The atrium was mildly dilated.  Labs/Other Tests and Data Reviewed:    EKG:  An ECG dated 05/22/2017 was personally reviewed today and demonstrated:  Atrial fibrillation.  Recent Labs: 01/25/2018: ALT 27; BUN 19; Creatinine, Ser 1.11; Hemoglobin 15.9; Platelets 254.0; Potassium 4.6; Sodium 144; TSH 1.40   Recent Lipid Panel Lab Results  Component Value Date/Time   CHOL 157 06/26/2017 09:28 AM   TRIG 80.0 06/26/2017 09:28 AM   HDL 40.40 06/26/2017 09:28 AM   CHOLHDL 4 06/26/2017 09:28 AM   LDLCALC 101 (H) 06/26/2017 09:28 AM   LDLDIRECT 141.3 03/23/2009 11:48 AM    Wt Readings from Last 3 Encounters:  11/27/18 203 lb (92.1 kg)  04/25/18 207 lb (93.9 kg)  01/25/18 206 lb 12.8 oz (93.8 kg)     Objective:    Vital Signs:  BP 128/78   Pulse 80   Ht 5\' 11"  (1.803 m)   Wt 203 lb (92.1 kg)   BMI 28.31 kg/m      ASSESSMENT & PLAN:    1.  Atrial fibrillation.  He is doing well in terms of palpitations on current dose of Cardizem CD.  No bleeding problems on Eliquis.  He will have lab work with PCP.  2.  Diet managed hyperlipidemia.  COVID-19 Education: The signs and symptoms of COVID-19 were discussed with the patient and how to seek care for testing (follow up with PCP or arrange E-visit).  The importance of social distancing was discussed today.  Time:   Today, I have spent 8 minutes with the patient with telehealth technology discussing the above problems.     Medication Adjustments/Labs and Tests Ordered: Current medicines are reviewed at length with the patient today.  Concerns regarding medicines are outlined above.   Tests Ordered: No orders of the defined types were placed in this encounter.   Medication Changes: No orders of the defined types were placed in this encounter.   Follow Up:  In Person 6 months in the [place] office.  Signed, Rozann Lesches, MD  11/27/2018 10:44  AM    [place]

## 2018-12-03 ENCOUNTER — Encounter: Payer: Self-pay | Admitting: Family Medicine

## 2018-12-13 ENCOUNTER — Other Ambulatory Visit: Payer: Self-pay | Admitting: *Deleted

## 2018-12-13 MED ORDER — APIXABAN 5 MG PO TABS: 5.0000 mg | ORAL_TABLET | Freq: Two times a day (BID) | ORAL | 6 refills | Status: DC

## 2018-12-18 ENCOUNTER — Other Ambulatory Visit: Payer: Self-pay

## 2018-12-18 ENCOUNTER — Encounter: Payer: Self-pay | Admitting: Family Medicine

## 2018-12-18 ENCOUNTER — Ambulatory Visit: Payer: PRIVATE HEALTH INSURANCE | Admitting: Family Medicine

## 2018-12-18 VITALS — BP 120/82 | HR 61 | Temp 98.2°F | Ht 71.0 in | Wt 210.2 lb

## 2018-12-18 DIAGNOSIS — H6122 Impacted cerumen, left ear: Secondary | ICD-10-CM | POA: Diagnosis not present

## 2018-12-18 NOTE — Patient Instructions (Addendum)
Health Maintenance Due  Topic Date Due  . TETANUS/TDAP - today? Or can get at pharmacy  06/05/2018   Complete removal of left cerumen impaction.  Please try to avoid using Q-tips in the future.  If you feel like the ears start to get full of wax can try Debrox or the following  Mineral oil for ear full of wax Purchase mineral oil from laxative aisle Lay down on your side with ear that is bothering you facing up Use 3-4 drops with a dropper and place in ear for 30 seconds Place cotton swab outside of ear Turn to other side and allow this to drain Repeat 3-4 x a day Return to see [place] if not improving within a few days

## 2018-12-18 NOTE — Progress Notes (Signed)
Phone [phone] In person visit   Subjective:   Robert David is a 68 y.o. year old very pleasant male patient who presents for/with See problem oriented charting Chief Complaint  Patient presents with  . Follow-up  . left ear clogged   ROS-no fever/chills.  Occasional issues with sinus congestion are slightly productive cough in the morning but improved with allergy medicine.  Past Medical History-  Patient Active Problem List   Diagnosis Date Noted  . Atrial fibrillation ([place]) 05/26/2009    Priority: High  . BPH associated with nocturia 06/22/2017    Priority: Medium  . Hyperglycemia 09/01/2014    Priority: Medium  . Elevated blood pressure (not hypertension) 09/01/2014    Priority: Medium  . Insomnia 02/24/2014    Priority: Medium  . Diastolic dysfunction [id number]    Priority: Medium  . Hyperlipidemia 05/11/2007    Priority: Medium  . Contact dermatitis 09/01/2014    Priority: Low  . Former smoker 02/24/2014    Priority: Low  . Chest pain 07/18/2012    Priority: Low  . GERD (gastroesophageal reflux disease) 07/18/2012    Priority: Low  . Vitamin D deficiency 01/25/2018  . Prepatellar bursitis, right knee 06/22/2017  . Displaced bimalleolar fracture of right lower leg, subsequent encounter for closed fracture with delayed healing 02/12/2016    Medications- reviewed and updated Current Outpatient Medications  Medication Sig Dispense Refill  . apixaban (ELIQUIS) 5 MG TABS tablet Take 1 tablet (5 mg total) by mouth 2 (two) times daily. 60 tablet 6  . diltiazem (CARDIZEM CD) 180 MG 24 hr capsule Take 1 capsule (180 mg total) by mouth daily. 90 capsule 3   No current facility-administered medications for this visit.      Objective:  BP 120/82   Pulse 61   Temp 98.2 F (36.8 C)   Ht 5\' 11"  (1.803 m)   Wt 210 lb 3.2 oz (95.3 kg)   SpO2 95%   BMI 29.32 kg/m  Gen: NAD, resting comfortably ENT: Total occlusion in the left ear due to cerumen.  Cerumen  impairs exam of clinically significant portions of the external auditory canal, tympanic membrane or middle ear condition. Obstructive, copious cerumen. This resolved with irrigation-normal view of tympanic membrane on the left as well as on the right.  Procedure note: Verbal consent obtained- discussed risk of tympanic membrane perforation Obstructive copious Cerumen noted in left ear. This obscures evaluation of tympanic membrane.  Irrigation with water and peroxide performed. Full view of tympanic membrane after procedure.  Patient tolerated procedure well     Assessment and Plan   Hearing loss due to cerumen impaction, left  S:pt states he thinks he has wax build up and his wife thinks he needs a hearing.  Patient states noted fullness in his left ear for about a week.  Seem to worsen today when he used a Q-tip.  Denies pain.  Some intermittent nasal congestion improved on daily allergy medicine.  Patient has noted some hearing loss associated with the fullness. A/P: Cerumen impaction left ear leading to partial hearing loss.  Full resolution of hearing loss after irrigation today -Her allergies over-the-counter antihistamine is reasonable.  Has physical next month and could layer Flonase if needed on top of antihistamine  Recommended follow up: Upcoming physical Future Appointments  Date Time Provider [place]  01/10/2019  9:20 AM Marin Olp, MD [facility]    Lab/Order associations:   ICD-10-CM   1. Hearing loss due to  cerumen impaction, left  H61.22    Return precautions advised.  Garret Reddish, MD

## 2018-12-21 ENCOUNTER — Other Ambulatory Visit: Payer: Self-pay | Admitting: Cardiology

## 2019-01-07 ENCOUNTER — Encounter: Payer: PRIVATE HEALTH INSURANCE | Admitting: Family Medicine

## 2019-01-09 ENCOUNTER — Other Ambulatory Visit: Payer: Self-pay

## 2019-01-10 ENCOUNTER — Ambulatory Visit (INDEPENDENT_AMBULATORY_CARE_PROVIDER_SITE_OTHER): Payer: PRIVATE HEALTH INSURANCE | Admitting: Family Medicine

## 2019-01-10 ENCOUNTER — Encounter: Payer: Self-pay | Admitting: Family Medicine

## 2019-01-10 VITALS — BP 140/72 | HR 75 | Temp 97.9°F | Ht 71.0 in | Wt 207.8 lb

## 2019-01-10 DIAGNOSIS — E559 Vitamin D deficiency, unspecified: Secondary | ICD-10-CM | POA: Diagnosis not present

## 2019-01-10 DIAGNOSIS — K219 Gastro-esophageal reflux disease without esophagitis: Secondary | ICD-10-CM

## 2019-01-10 DIAGNOSIS — E785 Hyperlipidemia, unspecified: Secondary | ICD-10-CM

## 2019-01-10 DIAGNOSIS — Z0000 Encounter for general adult medical examination without abnormal findings: Secondary | ICD-10-CM | POA: Diagnosis not present

## 2019-01-10 DIAGNOSIS — Z23 Encounter for immunization: Secondary | ICD-10-CM | POA: Diagnosis not present

## 2019-01-10 DIAGNOSIS — G4700 Insomnia, unspecified: Secondary | ICD-10-CM

## 2019-01-10 DIAGNOSIS — N401 Enlarged prostate with lower urinary tract symptoms: Secondary | ICD-10-CM | POA: Diagnosis not present

## 2019-01-10 DIAGNOSIS — I4821 Permanent atrial fibrillation: Secondary | ICD-10-CM

## 2019-01-10 DIAGNOSIS — Z87891 Personal history of nicotine dependence: Secondary | ICD-10-CM

## 2019-01-10 DIAGNOSIS — R351 Nocturia: Secondary | ICD-10-CM

## 2019-01-10 DIAGNOSIS — Z125 Encounter for screening for malignant neoplasm of prostate: Secondary | ICD-10-CM

## 2019-01-10 DIAGNOSIS — R030 Elevated blood-pressure reading, without diagnosis of hypertension: Secondary | ICD-10-CM

## 2019-01-10 DIAGNOSIS — R739 Hyperglycemia, unspecified: Secondary | ICD-10-CM | POA: Diagnosis not present

## 2019-01-10 LAB — CBC WITH DIFFERENTIAL/PLATELET
Basophils Absolute: 0.1 10*3/uL (ref 0.0–0.1)
Basophils Relative: 0.9 % (ref 0.0–3.0)
Eosinophils Absolute: 0.2 10*3/uL (ref 0.0–0.7)
Eosinophils Relative: 2.6 % (ref 0.0–5.0)
HCT: 47.7 % (ref 39.0–52.0)
Hemoglobin: 15.5 g/dL (ref 13.0–17.0)
Lymphocytes Relative: 30.7 % (ref 12.0–46.0)
Lymphs Abs: 1.8 10*3/uL (ref 0.7–4.0)
MCHC: 32.6 g/dL (ref 30.0–36.0)
MCV: 88.3 fl (ref 78.0–100.0)
Monocytes Absolute: 0.6 10*3/uL (ref 0.1–1.0)
Monocytes Relative: 10.7 % (ref 3.0–12.0)
Neutro Abs: 3.2 10*3/uL (ref 1.4–7.7)
Neutrophils Relative %: 55.1 % (ref 43.0–77.0)
Platelets: 251 10*3/uL (ref 150.0–400.0)
RBC: 5.4 Mil/uL (ref 4.22–5.81)
RDW: 13.6 % (ref 11.5–15.5)
WBC: 5.9 10*3/uL (ref 4.0–10.5)

## 2019-01-10 LAB — LIPID PANEL
Cholesterol: 192 mg/dL (ref 0–200)
HDL: 39.7 mg/dL (ref >39.00)
LDL Cholesterol: 137 mg/dL — ABNORMAL HIGH (ref 0–99)
NonHDL: 152.71
Total CHOL/HDL Ratio: 5
Triglycerides: 80 mg/dL (ref 0.0–149.0)
VLDL: 16 mg/dL (ref 0.0–40.0)

## 2019-01-10 LAB — COMPREHENSIVE METABOLIC PANEL
ALT: 31 U/L (ref 0–53)
AST: 20 U/L (ref 0–37)
Albumin: 4.7 g/dL (ref 3.5–5.2)
Alkaline Phosphatase: 51 U/L (ref 39–117)
BUN: 18 mg/dL (ref 6–23)
CO2: 29 mEq/L (ref 19–32)
Calcium: 9.5 mg/dL (ref 8.4–10.5)
Chloride: 105 mEq/L (ref 96–112)
Creatinine, Ser: 1.04 mg/dL (ref 0.40–1.50)
GFR: 70.59 mL/min (ref >60.00)
Glucose, Bld: 94 mg/dL (ref 70–99)
Potassium: 4.6 mEq/L (ref 3.5–5.1)
Sodium: 142 mEq/L (ref 135–145)
Total Bilirubin: 0.7 mg/dL (ref 0.2–1.2)
Total Protein: 6.8 g/dL (ref 6.0–8.3)

## 2019-01-10 LAB — VITAMIN D 25 HYDROXY (VIT D DEFICIENCY, FRACTURES): VITD: 24.74 ng/mL — ABNORMAL LOW (ref 30.00–100.00)

## 2019-01-10 LAB — HEMOGLOBIN A1C: Hgb A1c MFr Bld: 5.8 % (ref 4.6–6.5)

## 2019-01-10 LAB — PSA: PSA: 1.44 ng/mL (ref 0.10–4.00)

## 2019-01-10 NOTE — Patient Instructions (Addendum)
Health Maintenance Due  Topic Date Due  . TETANUS/TDAP - today  06/05/2018   Please stop by lab before you go If you do not have mychart- we will call you about results within 5 business days of [place] receiving them.  If you have mychart- we will send your results within 3 business days of [place] receiving them.  If abnormal or we want to clarify a result, we will call or mychart you to make sure you receive the message.  If you have questions or concerns or don't hear within 5-7 days, please send [place] a message or call us.    keep an eye on blood pressure with goal at least 138/88 or less on average- if higher than that see me sooner or at least update me through mychart   Recommended follow up: Return in about 1 year (around 01/10/2020) for physical or sooner if needed.

## 2019-01-10 NOTE — Progress Notes (Signed)
Phone: [phone]   Subjective:  Patient presents today for their annual physical. Chief complaint-noted.   See problem oriented charting- ROS- full  review of systems was completed and negative  except for: nocturia  The following were reviewed and entered/updated in epic: Past Medical History:  Diagnosis Date  . Headache(784.0)   . Hyperlipidemia   . Persistent atrial fibrillation ([place])   . Seasonal allergies    Patient Active Problem List   Diagnosis Date Noted  . Atrial fibrillation ([place]) 05/26/2009    Priority: High  . BPH associated with nocturia 06/22/2017    Priority: Medium  . Hyperglycemia 09/01/2014    Priority: Medium  . Elevated blood pressure reading without diagnosis of hypertension 09/01/2014    Priority: Medium  . Insomnia 02/24/2014    Priority: Medium  . Diastolic dysfunction [id number]    Priority: Medium  . Hyperlipidemia 05/11/2007    Priority: Medium  . Contact dermatitis 09/01/2014    Priority: Low  . Former smoker 02/24/2014    Priority: Low  . Chest pain 07/18/2012    Priority: Low  . GERD (gastroesophageal reflux disease) 07/18/2012    Priority: Low  . Vitamin D deficiency 01/25/2018  . Prepatellar bursitis, right knee 06/22/2017  . Displaced bimalleolar fracture of right lower leg, subsequent encounter for closed fracture with delayed healing 02/12/2016   Past Surgical History:  Procedure Laterality Date  . CARDIOVERSION N/A 05/12/2017   Procedure: CARDIOVERSION;  Surgeon: Fay Records, MD;  Location: [facility] ENDOSCOPY;  Service: Cardiovascular;  Laterality: N/A;  . COLONOSCOPY    . LEFT HEART CATHETERIZATION WITH CORONARY ANGIOGRAM N/A 07/19/2012   Procedure: LEFT HEART CATHETERIZATION WITH CORONARY ANGIOGRAM;  Surgeon: Minus Breeding, MD;  Location: [facility] CATH LAB;  Service: Cardiovascular;  Laterality: N/A;  . ORIF ANKLE FRACTURE Right 02/17/2016   Procedure: OPEN REDUCTION INTERNAL FIXATION (ORIF) BIMALLEOLAR ANKLE FRACTURE;  Surgeon:  Leandrew Koyanagi, MD;  Location: [place];  Service: Orthopedics;  Laterality: Right;  . POLYPECTOMY      Family History  Problem Relation Age of Onset  . Cancer Mother   . Breast cancer Mother   . Heart disease Father        hx A-Fib, 104 with MI  . Colon cancer Father 53       metastatic to lung  . Rectal cancer Neg Hx   . Stomach cancer Neg Hx     Medications- reviewed and updated Current Outpatient Medications  Medication Sig Dispense Refill  . apixaban (ELIQUIS) 5 MG TABS tablet Take 1 tablet (5 mg total) by mouth 2 (two) times daily. 60 tablet 6  . diltiazem (CARDIZEM CD) 180 MG 24 hr capsule TAKE 1 CAPSULE(180 MG) BY MOUTH DAILY 90 capsule 3   No current facility-administered medications for this visit.     Allergies-reviewed and updated No Known Allergies  Social History   Social History Narrative   Married (wife [name] (bobby gail) also Dr. Yong Channel patient) [phone]. 3 step children. 3 stepgrandchildren.       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       Hobbies: bowling, golf, exercise    Objective  Objective:  BP 140/72   Pulse 75   Temp 97.9 F (36.6 C)   Ht 5\' 11"  (1.803 m)   Wt 207 lb 12.8 oz (94.3 kg)   SpO2 97%   BMI 28.98 kg/m  Gen: NAD, resting comfortably HEENT: Mask not removed due to covid 19.  TM normal. Bridge of nose normal. Eyelids normal.  Neck: no thyromegaly or cervical lymphadenopathy  CV: RRR no murmurs rubs or gallops Lungs: CTAB no crackles, wheeze, rhonchi Abdomen: soft/nontender/nondistended/normal bowel sounds. No rebound or guarding.  Ext: no edema Skin: warm, dry Neuro: grossly normal, moves all extremities, PERRLA   Assessment and Plan  68 y.o. male presenting for annual physical.  Health Maintenance counseling: 1. Anticipatory guidance: Patient counseled regarding regular dental exams yes-q6 months, eye exams yes-yearly typically,  avoiding smoking and second hand smoke, limiting alcohol to 2 or less  beverages per day-does not drink.   2. Risk factor reduction:  Advised patient of need for regular exercise and diet rich and fruits and vegetables to reduce risk of heart attack and stroke. Exercise- not doing much due to covid but is active- he plans to restart at [facility] once safe from covid. Diet-weight up 7 pounds from last May- he feels like needs to work on portion control- some stress with covid Wt Readings from Last 3 Encounters:  01/10/19 207 lb 12.8 oz (94.3 kg)  12/18/18 210 lb 3.2 oz (95.3 kg)  11/27/18 203 lb (92.1 kg)  3. Immunizations/screenings/ancillary studies-Tdap today.  Discussed Shingrix- hold off for now  Immunization History  Administered Date(s) Administered  . Influenza Split 10/20/2011  . Influenza Whole 11/26/2008  . Influenza, High Dose Seasonal PF 01/09/2015, 03/14/2016, 11/14/2017, 10/08/2018  . Influenza,inj,Quad PF,6+ Mos 01/21/2013  . Influenza-Unspecified 01/21/2014, 01/05/2017, 11/14/2017  . Pneumococcal Conjugate-13 03/09/2015  . Pneumococcal-Unspecified 01/21/2014  . Td 06/04/2008  . Zoster 10/11/2011   4. Prostate cancer screening-  BPH associated with nocturia-trend PSA with labs today.  Nocturia 2-3x a night-stable over last year.  Defer rectal for now.  Declines need for medication. He may try saw palmetto Lab Results  Component Value Date   PSA 1.67 06/26/2017   PSA 1.94 03/14/2016   PSA 1.59 03/09/2015   5. Colon cancer screening - 05/2014 with 10 year repeat 6. Skin cancer screening- Dr. Tarri Glenn yearly. advised regular sunscreen use. Denies worrisome, changing, or new skin lesions.  7. Former smoker (quit 30 years ago)- will do this when goes onto [employer].    Status of chronic or acute concerns   Permanent atrial fibrillation (HCC)-on diltiazem 120 mg extended release for rate control.  On Eliquis for stroke prevention/anticoagulation  -he would like labs forwarded to Dr. Rozann Lesches of cardiology  Hyperglycemia-we will trend A1c with  labs today.  Continue to work on healthy eating/regular exercise-lifestyle choices have been thrown off by [employer]  Component Value Date   HGBA1C 5.9 01/25/2018   HGBA1C 5.8 06/26/2017   HGBA1C 5.8 03/14/2016    Elevated blood pressure reading without diagnosis of hypertension-initial systolic reading slightly high at 140, on repeat similar- per age technically with JNC8 can tolerate this reading but home #s look even better . Home readings 110s/70s. Feels like #s have looked good olipure ultra- olive leaf extract.    Insomnia, unspecified type-patient usually controls this with somnapure or melatonin in past- he is using a supplement called sleep 3 now.  Temazepam in the past - has not use din a long time as did not agree with him.   Hyperlipidemia, unspecified hyperlipidemia type-on aspirin.  10-year ASCVD risk of 23.4%-we discussed newer guidelines stressing LDL goal under 70 . He is taking a new supplement and hed like to see how #s look.  - he strongly prefers to remain off statin even if risk above  20%- would like to get back to his exercise as well Lab Results  Component Value Date   CHOL 157 06/26/2017   HDL 40.40 06/26/2017   LDLCALC 101 (H) 06/26/2017   LDLDIRECT 141.3 03/23/2009   TRIG 80.0 06/26/2017   CHOLHDL 4 06/26/2017   Gastroesophageal reflux disease without esophagitis-has not needed after prior weight loss- rare pepcid if has issues   Vitamin D deficiency-currently taking at least 1000 a day.  We will recheck with labs today   Recommended follow up: Return in about 1 year (around 01/10/2020) for physical or sooner if needed.  Lab/Order associations: fasting   ICD-10-CM   1. Preventative health care  Z00.00 CBC with Differential/Platelet    Comprehensive metabolic panel    Lipid panel    PSA    Hemoglobin A1c    Vitamin D  (25 hydroxy )  2. Permanent atrial fibrillation (HCC)  I48.21   3. BPH associated with nocturia  N40.1    R35.1   4.  Hyperglycemia  R73.9 Hemoglobin A1c  5. Elevated blood pressure reading without diagnosis of hypertension  R03.0   6. Insomnia, unspecified type  G47.00   7. Hyperlipidemia, unspecified hyperlipidemia type  E78.5 CBC with Differential/Platelet    Comprehensive metabolic panel    Lipid panel  8. Former smoker  Z87.891   23. Gastroesophageal reflux disease without esophagitis  K21.9   10. Vitamin D deficiency  E55.9 Vitamin D  (25 hydroxy )  11. Screening for prostate cancer  Z12.5 PSA  12. Need for tetanus, diphtheria, and acellular pertussis (Tdap) vaccine  Z23 Tdap vaccine greater than or equal to 7yo IM   Return precautions advised.  Garret Reddish, MD

## 2019-01-17 ENCOUNTER — Encounter: Payer: Self-pay | Admitting: Family Medicine

## 2019-01-17 ENCOUNTER — Other Ambulatory Visit: Payer: Self-pay

## 2019-01-17 DIAGNOSIS — Z20828 Contact with and (suspected) exposure to other viral communicable diseases: Secondary | ICD-10-CM

## 2019-01-17 DIAGNOSIS — Z20822 Contact with and (suspected) exposure to covid-19: Secondary | ICD-10-CM

## 2019-01-18 LAB — NOVEL CORONAVIRUS, NAA: SARS-CoV-2, NAA: DETECTED — AB

## 2019-01-20 ENCOUNTER — Telehealth: Payer: Self-pay | Admitting: Unknown Physician Specialty

## 2019-01-20 ENCOUNTER — Encounter: Payer: Self-pay | Admitting: Family Medicine

## 2019-01-20 NOTE — Telephone Encounter (Signed)
Discussed with patient about Covid symptoms and the use of bamlanivimab, a monoclonal antibody infusion for those with mild to moderate Covid symptoms and at a high risk of hospitalization.  Pt is qualified for this infusion at the [facility] infusion center due to Age > 46 which were addressed with the patient and are actively being managed by a [facility] provider.    After discussing the infusion's costs, potential benefits and side effects, the patient has decided to think about treatment with monoclonal antibodies.

## 2019-01-21 ENCOUNTER — Encounter: Payer: Self-pay | Admitting: Family Medicine

## 2019-01-21 ENCOUNTER — Telehealth: Payer: Self-pay | Admitting: Unknown Physician Specialty

## 2019-01-21 ENCOUNTER — Other Ambulatory Visit: Payer: Self-pay | Admitting: Unknown Physician Specialty

## 2019-01-21 DIAGNOSIS — U071 COVID-19: Secondary | ICD-10-CM

## 2019-01-21 NOTE — Telephone Encounter (Signed)
  I connected by phone with Robert David on 01/21/2019 at 9:46 AM to discuss the potential use of an new treatment for mild to moderate COVID-19 viral infection in non-hospitalized patients.  This patient is a 68 y.o. male that meets the FDA criteria for Emergency Use Authorization of bamlanivimab or casirivimab\imdevimab.  Has a (+) direct SARS-CoV-2 viral test result  Has mild or moderate COVID-19   Is ? 70 years of age and weighs ? 40 kg  Is NOT hospitalized due to COVID-19  Is NOT requiring oxygen therapy or requiring an increase in baseline oxygen flow rate due to COVID-19  Is within 10 days of symptom onset  Has at least one of the high risk factor(s) for progression to severe COVID-19 and/or hospitalization as defined in EUA.  Specific high risk criteria : >/= 70 yo   I have spoken and communicated the following to the patient or parent/caregiver:  1. FDA has authorized the emergency use of bamlanivimab and casirivimab\imdevimab for the treatment of mild to moderate COVID-19 in adults and pediatric patients with positive results of direct SARS-CoV-2 viral testing who are 53 years of age and older weighing at least 40 kg, and who are at high risk for progressing to severe COVID-19 and/or hospitalization.  2. The significant known and potential risks and benefits of bamlanivimab and casirivimab\imdevimab, and the extent to which such potential risks and benefits are unknown.  3. Information on available alternative treatments and the risks and benefits of those alternatives, including clinical trials.  4. Patients treated with bamlanivimab and casirivimab\imdevimab should continue to self-isolate and use infection control measures (e.g., wear mask, isolate, social distance, avoid sharing personal items, clean and disinfect "high touch" surfaces, and frequent handwashing) according to CDC guidelines.   5. The patient or parent/caregiver has the option to accept or refuse  bamlanivimab or casirivimab\imdevimab .  After reviewing this information with the patient, The patient agreed to proceed with receiving the bamlanimivab infusion and will be provided a copy of the Fact sheet prior to receiving the infusion.[name] 01/21/2019 9:46 AM

## 2019-01-22 ENCOUNTER — Ambulatory Visit (HOSPITAL_COMMUNITY)
Admission: RE | Admit: 2019-01-22 | Discharge: 2019-01-22 | Disposition: A | Discharge status: Home / Self Care | Payer: 59 | Source: Ambulatory Visit | Attending: Pulmonary Disease | Admitting: Pulmonary Disease

## 2019-01-22 DIAGNOSIS — U071 COVID-19: Secondary | ICD-10-CM | POA: Diagnosis present

## 2019-01-22 MED ORDER — FAMOTIDINE IN NACL 20-0.9 MG/50ML-% IV SOLN: 20.0000 mg | Freq: Once | INTRAVENOUS | Status: DC | PRN

## 2019-01-22 MED ORDER — SODIUM CHLORIDE 0.9 % IV SOLN
INTRAVENOUS | Status: DC | PRN
  Administered 2019-01-22: 250 mL via INTRAVENOUS

## 2019-01-22 MED ORDER — METHYLPREDNISOLONE SODIUM SUCC 125 MG IJ SOLR: 125.0000 mg | Freq: Once | INTRAMUSCULAR | Status: DC | PRN

## 2019-01-22 MED ORDER — SODIUM CHLORIDE 0.9 % IV SOLN
700.0000 mg | Freq: Once | INTRAVENOUS | Status: AC
  Administered 2019-01-22: 700 mg via INTRAVENOUS
  Filled 2019-01-22: qty 20

## 2019-01-22 MED ORDER — DIPHENHYDRAMINE HCL 50 MG/ML IJ SOLN: 50.0000 mg | Freq: Once | INTRAMUSCULAR | Status: DC | PRN

## 2019-01-22 MED ORDER — ALBUTEROL SULFATE HFA 108 (90 BASE) MCG/ACT IN AERS: 2.0000 | INHALATION_SPRAY | Freq: Once | RESPIRATORY_TRACT | Status: DC | PRN

## 2019-01-22 MED ORDER — EPINEPHRINE 0.3 MG/0.3ML IJ SOAJ: 0.3000 mg | Freq: Once | INTRAMUSCULAR | Status: DC | PRN

## 2019-03-14 ENCOUNTER — Encounter: Payer: Self-pay | Admitting: Family Medicine

## 2019-03-22 ENCOUNTER — Other Ambulatory Visit: Payer: Self-pay

## 2019-03-25 ENCOUNTER — Ambulatory Visit (INDEPENDENT_AMBULATORY_CARE_PROVIDER_SITE_OTHER): Payer: PRIVATE HEALTH INSURANCE | Admitting: Family Medicine

## 2019-03-25 ENCOUNTER — Other Ambulatory Visit: Payer: Self-pay

## 2019-03-25 ENCOUNTER — Encounter: Payer: Self-pay | Admitting: Family Medicine

## 2019-03-25 DIAGNOSIS — I10 Essential (primary) hypertension: Secondary | ICD-10-CM | POA: Diagnosis not present

## 2019-03-25 MED ORDER — DILTIAZEM HCL ER COATED BEADS 240 MG PO CP24: 240.0000 mg | ORAL_CAPSULE | Freq: Every day | ORAL | 5 refills | Status: DC

## 2019-03-25 NOTE — Progress Notes (Signed)
Phone [phone] In person visit   Subjective:   TERELLE FONDREN is a 69 y.o. year old very pleasant male patient who presents for/with See problem oriented charting Chief Complaint  Patient presents with  . Hypertension   This visit occurred during the SARS-CoV-2 public health emergency.  Safety protocols were in place, including screening questions prior to the visit, additional usage of staff PPE, and extensive cleaning of exam room while observing appropriate contact time as indicated for disinfecting solutions.   Past Medical History-  Patient Active Problem List   Diagnosis Date Noted  . Atrial fibrillation ([place]) 05/26/2009    Priority: High  . Essential hypertension 03/25/2019    Priority: Medium  . BPH associated with nocturia 06/22/2017    Priority: Medium  . Hyperglycemia 09/01/2014    Priority: Medium  . Insomnia 02/24/2014    Priority: Medium  . Diastolic dysfunction 99991111    Priority: Medium  . Hyperlipidemia 05/11/2007    Priority: Medium  . Contact dermatitis 09/01/2014    Priority: Low  . Former smoker 02/24/2014    Priority: Low  . Chest pain 07/18/2012    Priority: Low  . GERD (gastroesophageal reflux disease) 07/18/2012    Priority: Low  . Vitamin D deficiency 01/25/2018  . Prepatellar bursitis, right knee 06/22/2017  . Displaced bimalleolar fracture of right lower leg, subsequent encounter for closed fracture with delayed healing 02/12/2016    Medications- reviewed and updated Current Outpatient Medications  Medication Sig Dispense Refill  . apixaban (ELIQUIS) 5 MG TABS tablet Take 1 tablet (5 mg total) by mouth 2 (two) times daily. 60 tablet 6  . diltiazem (CARDIZEM CD) 240 MG 24 hr capsule Take 1 capsule (240 mg total) by mouth daily. 30 capsule 5   No current facility-administered medications for this visit.     Objective:  BP (!) 158/88   Pulse 90   Temp (!) 97.5 F (36.4 C) (Temporal)   Ht 5\' 11"  (1.803 m)   Wt 206 lb (93.4 kg)    SpO2 98%   BMI 28.73 kg/m  Gen: NAD, resting comfortably CV: RRR no murmurs rubs or gallops Lungs: CTAB no crackles, wheeze, rhonchi Ext: no edema Skin: warm, dry      Assessment and Plan   #hypertension S: not currently on medication other than diltiazem 180 mg extended release for atrial fibrillation.  Not previously diagnosed as having hypertension.  Patient has had elevated blood pressure readings. He has brought list with him for review. High readings have been in the morning more than later in the day. We have compared to our readings and his cuff is a little bit higher then what we have received in office today. Denies any SOB, chest pain, changes in vision. He did have covid in December.    He brings 26 home numbers from various times of day-average today was 133/79-comparison of his cuff to in office numbers validates cuff. BP Readings from Last 3 Encounters:  03/25/19 (!) 158/88  01/22/19 (!) 147/69  01/10/19 140/72  A/P: I think patient does have an element of whitecoat hypertension but I think there is also underlying hypertension with last 3 readings at [id number] or above systolic and several numbers above 140 or over 90 at home-still think with his overall average is less than 140/90.  We discussed continuing current regimen versus increasing to diltiazem 240 mg extended release-he wanted to try the higher dose after some counseled today. -He also feels like his wife is  cooking with higher salt content after COVID-19 and anosmia so possible blood pressure will trend down once her sense of taste and smell comes back  Given home average was already less than 140/90 I was okay with him following up in December unless he wants to see [place] sooner to discuss further adjustments  We also sent a message to cardiology  "Dr. Domenic Polite,   Patient brought a list of home blood pressure. Has several readings above 140 and several above 90. His overall average was not bad at 133/79 but we opted  to increase diltiazem slightly to see if this can lower average slightly and perhaps prevent some of the #s over 140 and over 90. I wanted to keep you in the loop since you prescribed the diltiazem originally.   Thanks, Dr. Yong Channel"  Recommended follow up: Follow-up in December unless has concerns about blood pressure or other new or worsening concerns Future Appointments  Date Time Provider [place]  01/14/2020  8:20 AM Marin Olp, MD [facility]   Lab/Order associations:   ICD-10-CM   1. Essential hypertension  I10     Meds ordered this encounter  Medications  . diltiazem (CARDIZEM CD) 240 MG 24 hr capsule    Sig: Take 1 capsule (240 mg total) by mouth daily.    Dispense:  30 capsule    Refill:  5   Return precautions advised.  Garret Reddish, MD

## 2019-03-25 NOTE — Assessment & Plan Note (Signed)
S: not currently on medication other than diltiazem 180 mg extended release for atrial fibrillation.  Not previously diagnosed as having hypertension.  Patient has had elevated blood pressure readings. He has brought list with him for review. High readings have been in the morning more than later in the day. We have compared to our readings and his cuff is a little bit higher then what we have received in office today. Denies any SOB, chest pain, changes in vision. He did have covid in December.    He brings 26 home numbers from various times of day-average today was 133/79-comparison of his cuff to in office numbers validates cuff. BP Readings from Last 3 Encounters:  03/25/19 (!) 158/88  01/22/19 (!) 147/69  01/10/19 140/72  A/P: New diagnosis of hypertension today.  I think patient does have an element of whitecoat hypertension but I think there is also underlying hypertension with last 3 readings at [id number] or above systolic and several numbers above 140 or over 90 at home-still think with his overall average is less than 140/90.  We discussed continuing current regimen versus increasing to diltiazem 240 mg extended release-he wanted to try the higher dose after some counseled today. -He also feels like his wife is cooking with higher salt content after COVID-19 and anosmia so possible blood pressure will trend down once her sense of taste and smell comes back  We also sent a message to cardiology  "Dr. Domenic Polite,   Patient brought a list of home blood pressure. Has several readings above 140 and several above 90. His overall average was not bad at 133/79 but we opted to increase diltiazem slightly to see if this can lower average slightly and perhaps prevent some of the #s over 140 and over 90. I wanted to keep you in the loop since you prescribed the diltiazem originally.   Thanks, Dr. Yong Channel"

## 2019-03-25 NOTE — Patient Instructions (Addendum)
Slight increase in diltiazem to 240 mg extended release. Continue to take in the evening.   Update me in 2-4 weeks by mychart.   Ideal blood pressure average <130/80 and I am hoping we get a little closer as you were 133/79- once again not very far off but since you are doing well with current dose we feel you can tolerate slight increase in med and slight decrease in blood pressure.    Recommended follow up: follow up in December unless you want to consider making further changes

## 2019-04-25 ENCOUNTER — Encounter: Payer: Self-pay | Admitting: Family Medicine

## 2019-06-03 ENCOUNTER — Encounter: Payer: Self-pay | Admitting: Cardiology

## 2019-06-03 ENCOUNTER — Ambulatory Visit: Payer: 59 | Admitting: Cardiology

## 2019-06-03 ENCOUNTER — Other Ambulatory Visit: Payer: Self-pay

## 2019-06-03 VITALS — BP 138/90 | HR 79 | Ht 71.0 in | Wt 208.0 lb

## 2019-06-03 DIAGNOSIS — E782 Mixed hyperlipidemia: Secondary | ICD-10-CM

## 2019-06-03 DIAGNOSIS — I4821 Permanent atrial fibrillation: Secondary | ICD-10-CM | POA: Diagnosis not present

## 2019-06-03 NOTE — Progress Notes (Signed)
Cardiology Office Note  Date: 06/03/2019   ID: Robert David, DOB [birth year], MRN [id number]  PCP:  Marin Olp, MD  Cardiologist:  Rozann Lesches, MD Electrophysiologist:  None   Chief Complaint  Patient presents with  . Cardiac follow-up    History of Present Illness: Robert David is a 69 y.o. male last assessed via telehealth encounter in October 2020.  He presents for a routine visit.  Reports occasional palpitations, no prolonged events.  Still remains active, pastor for 2 churches, enjoys working outdoors.  He has recovered from COVID-19 and had subsequent vaccinations.  I reviewed his follow-up lab work from December as outlined below.  He does not report any spontaneous bleeding problems, tolerating Eliquis and Cardizem CD.  I personally reviewed his ECG today which shows rate controlled atrial fibrillation.  Past Medical History:  Diagnosis Date  . Headache(784.0)   . Hyperlipidemia   . Persistent atrial fibrillation ([place])   . Seasonal allergies     Past Surgical History:  Procedure Laterality Date  . CARDIOVERSION N/A 05/12/2017   Procedure: CARDIOVERSION;  Surgeon: Fay Records, MD;  Location: [facility] ENDOSCOPY;  Service: Cardiovascular;  Laterality: N/A;  . COLONOSCOPY    . LEFT HEART CATHETERIZATION WITH CORONARY ANGIOGRAM N/A 07/19/2012   Procedure: LEFT HEART CATHETERIZATION WITH CORONARY ANGIOGRAM;  Surgeon: Minus Breeding, MD;  Location: [facility] CATH LAB;  Service: Cardiovascular;  Laterality: N/A;  . ORIF ANKLE FRACTURE Right 02/17/2016   Procedure: OPEN REDUCTION INTERNAL FIXATION (ORIF) BIMALLEOLAR ANKLE FRACTURE;  Surgeon: Leandrew Koyanagi, MD;  Location: [place];  Service: Orthopedics;  Laterality: Right;  . POLYPECTOMY      Current Outpatient Medications  Medication Sig Dispense Refill  . apixaban (ELIQUIS) 5 MG TABS tablet Take 1 tablet (5 mg total) by mouth 2 (two) times daily. 60 tablet 6  . diltiazem (CARDIZEM CD) 240 MG 24 hr capsule  Take 1 capsule (240 mg total) by mouth daily. 30 capsule 5   No current facility-administered medications for this visit.   Allergies:  Patient has no known allergies.   ROS:   No sudden dizziness or syncope.  Physical Exam: VS:  BP 138/90   Pulse 79   Ht 5\' 11"  (1.803 m)   Wt 208 lb (94.3 kg)   SpO2 97%   BMI 29.01 kg/m , BMI Body mass index is 29.01 kg/m.  Wt Readings from Last 3 Encounters:  06/03/19 208 lb (94.3 kg)  03/25/19 206 lb (93.4 kg)  01/10/19 207 lb 12.8 oz (94.3 kg)    General: Patient appears comfortable at rest. HEENT: Conjunctiva and lids normal, wearing a mask. Neck: Supple, no elevated JVP or carotid bruits, no thyromegaly. Lungs: Clear to auscultation, nonlabored breathing at rest. Cardiac: Irregularly irregular, no S3 or significant systolic murmur, no pericardial rub. Abdomen: Soft, nontender, bowel sounds present. Extremities: No pitting edema, distal pulses 2+.  ECG:  An ECG dated 05/22/2017 was personally reviewed today and demonstrated:  Atrial fibrillation.  Recent Labwork: 01/10/2019: ALT 31; AST 20; BUN 18; Creatinine, Ser 1.04; Hemoglobin 15.5; Platelets 251.0; Potassium 4.6; Sodium 142     Component Value Date/Time   CHOL 192 01/10/2019 1019   TRIG 80.0 01/10/2019 1019   HDL 39.70 01/10/2019 1019   CHOLHDL 5 01/10/2019 1019   VLDL 16.0 01/10/2019 1019   LDLCALC 137 (H) 01/10/2019 1019   LDLDIRECT 141.3 03/23/2009 1148    Other Studies Reviewed Today:  Echocardiogram 04/03/2017: Study Conclusions  -  Left ventricle: The cavity size was normal. Wall thickness was normal. Systolic function was normal. The estimated ejection fraction was in the range of 50% to 55%. Wall motion was normal; there were no regional wall motion abnormalities. - Right atrium: The atrium was mildly dilated.  Assessment and Plan:  1.  Permanent atrial fibrillation, CHA2DS2-VASc score is 1-2.  He remains on Eliquis for stroke prophylaxis and Cardizem CD  with good heart rate control.  ECG reviewed.  No changes made today.  2.  History of hyperlipidemia, diet managed and followed by PCP.  Medication Adjustments/Labs and Tests Ordered: Current medicines are reviewed at length with the patient today.  Concerns regarding medicines are outlined above.   Tests Ordered: Orders Placed This Encounter  Procedures  . EKG 12-Lead    Medication Changes: No orders of the defined types were placed in this encounter.   Disposition:  Follow up 6 months in the [place] office.  Signed, Satira Sark, MD, [facility] 06/03/2019 8:48 AM    [facility]. [address] [place], [place] [address] Phone: [phone]; Fax: [phone]

## 2019-06-03 NOTE — Patient Instructions (Signed)
Medication Instructions: Your physician recommends that you continue on your current medications as directed. Please refer to the Current Medication list given to you today.   Labwork: None today  Procedures/Testing: None today  Follow-Up: 6 months office visit with Dr.McDowell  Any Additional Special Instructions Will Be Listed Below (If Applicable).     If you need a refill on your cardiac medications before your next appointment, please call your pharmacy.   

## 2019-07-11 ENCOUNTER — Other Ambulatory Visit: Payer: Self-pay | Admitting: Cardiology

## 2019-07-26 ENCOUNTER — Telehealth: Payer: Self-pay | Admitting: Family Medicine

## 2019-07-26 NOTE — Telephone Encounter (Signed)
error 

## 2019-07-31 ENCOUNTER — Ambulatory Visit (INDEPENDENT_AMBULATORY_CARE_PROVIDER_SITE_OTHER): Payer: 59 | Admitting: Physician Assistant

## 2019-07-31 ENCOUNTER — Other Ambulatory Visit: Payer: Self-pay

## 2019-07-31 ENCOUNTER — Encounter: Payer: Self-pay | Admitting: Physician Assistant

## 2019-07-31 VITALS — BP 110/80 | HR 88 | Temp 98.1°F | Ht 71.0 in | Wt 208.0 lb

## 2019-07-31 DIAGNOSIS — N401 Enlarged prostate with lower urinary tract symptoms: Secondary | ICD-10-CM

## 2019-07-31 DIAGNOSIS — R351 Nocturia: Secondary | ICD-10-CM

## 2019-07-31 DIAGNOSIS — R5383 Other fatigue: Secondary | ICD-10-CM | POA: Diagnosis not present

## 2019-07-31 DIAGNOSIS — R739 Hyperglycemia, unspecified: Secondary | ICD-10-CM

## 2019-07-31 LAB — URINALYSIS, ROUTINE W REFLEX MICROSCOPIC
Bilirubin Urine: NEGATIVE
Ketones, ur: NEGATIVE
Leukocytes,Ua: NEGATIVE
Nitrite: NEGATIVE
Specific Gravity, Urine: 1.02 (ref 1.000–1.030)
Total Protein, Urine: NEGATIVE
Urine Glucose: NEGATIVE
Urobilinogen, UA: 0.2 (ref 0.0–1.0)
pH: 6.5 (ref 5.0–8.0)

## 2019-07-31 LAB — CBC WITH DIFFERENTIAL/PLATELET
Basophils Absolute: 0.1 10*3/uL (ref 0.0–0.1)
Basophils Relative: 0.8 % (ref 0.0–3.0)
Eosinophils Absolute: 0.2 10*3/uL (ref 0.0–0.7)
Eosinophils Relative: 2.8 % (ref 0.0–5.0)
HCT: 46 % (ref 39.0–52.0)
Hemoglobin: 15.3 g/dL (ref 13.0–17.0)
Lymphocytes Relative: 30.6 % (ref 12.0–46.0)
Lymphs Abs: 2 10*3/uL (ref 0.7–4.0)
MCHC: 33.3 g/dL (ref 30.0–36.0)
MCV: 87.3 fl (ref 78.0–100.0)
Monocytes Absolute: 0.6 10*3/uL (ref 0.1–1.0)
Monocytes Relative: 8.8 % (ref 3.0–12.0)
Neutro Abs: 3.7 10*3/uL (ref 1.4–7.7)
Neutrophils Relative %: 57 % (ref 43.0–77.0)
Platelets: 247 10*3/uL (ref 150.0–400.0)
RBC: 5.27 Mil/uL (ref 4.22–5.81)
RDW: 13.7 % (ref 11.5–15.5)
WBC: 6.4 10*3/uL (ref 4.0–10.5)

## 2019-07-31 LAB — COMPREHENSIVE METABOLIC PANEL
ALT: 25 U/L (ref 0–53)
AST: 18 U/L (ref 0–37)
Albumin: 4.9 g/dL (ref 3.5–5.2)
Alkaline Phosphatase: 48 U/L (ref 39–117)
BUN: 17 mg/dL (ref 6–23)
CO2: 31 mEq/L (ref 19–32)
Calcium: 10.4 mg/dL (ref 8.4–10.5)
Chloride: 104 mEq/L (ref 96–112)
Creatinine, Ser: 1.12 mg/dL (ref 0.40–1.50)
GFR: 64.7 mL/min (ref >60.00)
Glucose, Bld: 88 mg/dL (ref 70–99)
Potassium: 4.2 mEq/L (ref 3.5–5.1)
Sodium: 140 mEq/L (ref 135–145)
Total Bilirubin: 0.7 mg/dL (ref 0.2–1.2)
Total Protein: 7 g/dL (ref 6.0–8.3)

## 2019-07-31 LAB — HEMOGLOBIN A1C: Hgb A1c MFr Bld: 5.9 % (ref 4.6–6.5)

## 2019-07-31 LAB — TSH: TSH: 1.15 u[IU]/mL (ref 0.35–4.50)

## 2019-07-31 NOTE — Patient Instructions (Signed)
It was great to see you!  I will be in touch with your lab results and recommendations.  Please work on drinking an electrolyte solution (such as gatorade/powerade) if you are outside for >45 minutes and sweating.  Take care,  Inda Coke PA-C

## 2019-07-31 NOTE — Progress Notes (Signed)
Robert David is a 69 y.o. male here for a new problem.  I acted as a Education administrator for [employer], PA-C Anselmo Pickler, LPN   History of Present Illness:   Chief Complaint  Patient presents with  . Fatigue    HPI   Fatigue Pt c/o feeling tired and having little energy for the past 1.5 months. Pt having episodes of breaking out in a sweat, slight blurred vision. These episodes will last about 15 minutes eats something sweet and feels better. Denies: chest pain, n/v/d, unusual SOB, LE swelling, leg pain, dizziness. He takes his two medications (cardizem and eliquis) regularly without concerns and reports that he has been stable on these medications for quite some time. Last had cardiology evaluation in April 2021 with Dr. Domenic Polite and no changes were made to medications. Last echo in 2019 was WNL. Had COVID in December and is fully vaccinated.  Up until a week ago was only drinking 1 bottle water daily, now is drinking 3 bottles of water a day and feels like this helps his symptoms.  Eats all food groups. Typically only eats breakfast and dinner as meals, but snacks during the day. Denies going multiple hours during day without eating anything. Does not drink alcohol. Drinks water throughout the day.  Snores at night but no concerns for sleep apnea -- has not had a sleep study but states that he doesn't wake up gasping for air or has witnessed pauses in breathing. He does admit to getting up 2-3 times a night to urinate. Took saw palmetto for one month but did not have results so he stopped taking it. Gets on average 5 hours of sleep (interrupted.) Last PSA 6 months ago WNL.  Blood pressure at home has been 110/59.  Former smoker. 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  Wt Readings from Last 5 Encounters:  07/31/19 208 lb (94.3 kg)  06/03/19 208 lb (94.3 kg)  03/25/19 206 lb (93.4 kg)  01/10/19 207 lb 12.8 oz (94.3 kg)  12/18/18 210 lb 3.2 oz (95.3 kg)     Past Medical History:   Diagnosis Date  . Headache(784.0)   . Hyperlipidemia   . Persistent atrial fibrillation ([place])   . Seasonal allergies      Social History   Tobacco Use  . Smoking status: Former Smoker    Packs/day: 1.00    Years: 10.00    Pack years: 10.00    Types: Cigarettes    Quit date: 10/24/1988    Years since quitting: 30.7  . Smokeless tobacco: Never Used  Vaping Use  . Vaping Use: Never used  Substance Use Topics  . Alcohol use: No    Alcohol/week: 0.0 standard drinks  . Drug use: No    Past Surgical History:  Procedure Laterality Date  . CARDIOVERSION N/A 05/12/2017   Procedure: CARDIOVERSION;  Surgeon: Fay Records, MD;  Location: [facility] ENDOSCOPY;  Service: Cardiovascular;  Laterality: N/A;  . COLONOSCOPY    . LEFT HEART CATHETERIZATION WITH CORONARY ANGIOGRAM N/A 07/19/2012   Procedure: LEFT HEART CATHETERIZATION WITH CORONARY ANGIOGRAM;  Surgeon: Minus Breeding, MD;  Location: [facility] CATH LAB;  Service: Cardiovascular;  Laterality: N/A;  . ORIF ANKLE FRACTURE Right 02/17/2016   Procedure: OPEN REDUCTION INTERNAL FIXATION (ORIF) BIMALLEOLAR ANKLE FRACTURE;  Surgeon: Leandrew Koyanagi, MD;  Location: [facility];  Service: Orthopedics;  Laterality: Right;  . POLYPECTOMY      Family History  Problem Relation Age of Onset  .  Cancer Mother   . Breast cancer Mother   . Heart disease Father        hx A-Fib, 26 with MI  . Colon cancer Father 6       metastatic to lung  . Rectal cancer Neg Hx   . Stomach cancer Neg Hx     No Known Allergies  Current Medications:   Current Outpatient Medications:  .  diltiazem (CARDIZEM CD) 240 MG 24 hr capsule, Take 1 capsule (240 mg total) by mouth daily., Disp: 30 capsule, Rfl: 5 .  ELIQUIS 5 MG TABS tablet, TAKE 1 TABLET(5 MG) BY MOUTH TWICE DAILY, Disp: 60 tablet, Rfl: 6   Review of Systems:   ROS Negative unless otherwise specified per HPI.  Vitals:   Vitals:   07/31/19 1103  BP: 110/80  Pulse: 88  Temp: 98.1 F (36.7 C)   TempSrc: Temporal  SpO2: 97%  Weight: 208 lb (94.3 kg)  Height: 5\' 11"  (1.803 m)     Body mass index is 29.01 kg/m.  Physical Exam:   Physical Exam Vitals and nursing note reviewed.  Constitutional:      General: He is not in acute distress.    Appearance: He is well-developed. He is not ill-appearing or toxic-appearing.  Cardiovascular:     Rate and Rhythm: Normal rate. Rhythm irregularly irregular.     Pulses: Normal pulses.     Heart sounds: Normal heart sounds, S1 normal and S2 normal.     Comments: No LE edema Pulmonary:     Effort: Pulmonary effort is normal.     Breath sounds: Normal breath sounds.  Skin:    General: Skin is warm and dry.  Neurological:     Mental Status: He is alert.     GCS: GCS eye subscore is 4. GCS verbal subscore is 5. GCS motor subscore is 6.  Psychiatric:        Speech: Speech normal.        Behavior: Behavior normal. Behavior is cooperative.     Assessment and Plan:   [name] was seen today for fatigue.  Diagnoses and all orders for this visit:  Fatigue, unspecified type; Hx of hyperglycemia; Nocturia with BPH Suspect multifactorial. He is not getting enough sleep, but is concerned about starting a rx medication, such as flomax, due to possible side effects. He is agreeable to possibly starting this if all labs normal. Will check A1c and update other labs/urine to assess for other etiology -- will provide recommendations as indicated. No red flags on exam. Close follow-up with PCP to evaluate clinical symptoms. Encouraged continued increased water intake daily. -     CBC with Differential/Platelet -     Comprehensive metabolic panel -     Hemoglobin A1c -     TSH -     Urinalysis, Routine w reflex microscopic  . Reviewed expectations re: course of current medical issues. . Discussed self-management of symptoms. . Outlined signs and symptoms indicating need for more acute intervention. . Patient verbalized understanding and all  questions were answered. . See orders for this visit as documented in the electronic medical record. . Patient received an After-Visit Summary.  CMA or LPN served as scribe during this visit. History, Physical, and Plan performed by medical provider. The above documentation has been reviewed and is accurate and complete.  Inda Coke, PA-C

## 2019-08-02 ENCOUNTER — Other Ambulatory Visit: Payer: Self-pay | Admitting: Physician Assistant

## 2019-08-02 DIAGNOSIS — R3129 Other microscopic hematuria: Secondary | ICD-10-CM

## 2019-08-05 ENCOUNTER — Encounter: Payer: Self-pay | Admitting: Physician Assistant

## 2019-08-06 MED ORDER — TAMSULOSIN HCL 0.4 MG PO CAPS: 0.4000 mg | ORAL_CAPSULE | Freq: Every day | ORAL | 5 refills | Status: DC

## 2019-08-20 ENCOUNTER — Other Ambulatory Visit: Payer: 59

## 2019-08-23 ENCOUNTER — Ambulatory Visit (INDEPENDENT_AMBULATORY_CARE_PROVIDER_SITE_OTHER)
Admission: RE | Admit: 2019-08-23 | Discharge: 2019-08-23 | Disposition: A | Discharge status: Home / Self Care | Payer: 59 | Source: Ambulatory Visit | Attending: Family Medicine | Admitting: Family Medicine

## 2019-08-23 ENCOUNTER — Other Ambulatory Visit: Payer: Self-pay

## 2019-08-23 ENCOUNTER — Ambulatory Visit (INDEPENDENT_AMBULATORY_CARE_PROVIDER_SITE_OTHER): Payer: 59 | Admitting: Family Medicine

## 2019-08-23 ENCOUNTER — Encounter: Payer: Self-pay | Admitting: Family Medicine

## 2019-08-23 VITALS — BP 112/82 | HR 64 | Temp 98.5°F | Ht 71.0 in | Wt 207.4 lb

## 2019-08-23 DIAGNOSIS — G8929 Other chronic pain: Secondary | ICD-10-CM

## 2019-08-23 DIAGNOSIS — E559 Vitamin D deficiency, unspecified: Secondary | ICD-10-CM | POA: Diagnosis not present

## 2019-08-23 DIAGNOSIS — R5383 Other fatigue: Secondary | ICD-10-CM

## 2019-08-23 DIAGNOSIS — R519 Headache, unspecified: Secondary | ICD-10-CM

## 2019-08-23 DIAGNOSIS — R053 Chronic cough: Secondary | ICD-10-CM

## 2019-08-23 DIAGNOSIS — E785 Hyperlipidemia, unspecified: Secondary | ICD-10-CM | POA: Diagnosis not present

## 2019-08-23 DIAGNOSIS — I10 Essential (primary) hypertension: Secondary | ICD-10-CM

## 2019-08-23 DIAGNOSIS — R05 Cough: Secondary | ICD-10-CM

## 2019-08-23 DIAGNOSIS — I4821 Permanent atrial fibrillation: Secondary | ICD-10-CM | POA: Diagnosis not present

## 2019-08-23 LAB — CBC WITH DIFFERENTIAL/PLATELET
Basophils Absolute: 0 10*3/uL (ref 0.0–0.1)
Basophils Relative: 0.7 % (ref 0.0–3.0)
Eosinophils Absolute: 0.2 10*3/uL (ref 0.0–0.7)
Eosinophils Relative: 3.1 % (ref 0.0–5.0)
HCT: 43.8 % (ref 39.0–52.0)
Hemoglobin: 14.7 g/dL (ref 13.0–17.0)
Lymphocytes Relative: 28.8 % (ref 12.0–46.0)
Lymphs Abs: 1.6 10*3/uL (ref 0.7–4.0)
MCHC: 33.5 g/dL (ref 30.0–36.0)
MCV: 87.7 fl (ref 78.0–100.0)
Monocytes Absolute: 0.5 10*3/uL (ref 0.1–1.0)
Monocytes Relative: 8.9 % (ref 3.0–12.0)
Neutro Abs: 3.2 10*3/uL (ref 1.4–7.7)
Neutrophils Relative %: 58.5 % (ref 43.0–77.0)
Platelets: 233 10*3/uL (ref 150.0–400.0)
RBC: 4.99 Mil/uL (ref 4.22–5.81)
RDW: 13.8 % (ref 11.5–15.5)
WBC: 5.4 10*3/uL (ref 4.0–10.5)

## 2019-08-23 LAB — TSH: TSH: 1.45 u[IU]/mL (ref 0.35–4.50)

## 2019-08-23 LAB — COMPREHENSIVE METABOLIC PANEL
ALT: 27 U/L (ref 0–53)
AST: 20 U/L (ref 0–37)
Albumin: 4.7 g/dL (ref 3.5–5.2)
Alkaline Phosphatase: 48 U/L (ref 39–117)
BUN: 16 mg/dL (ref 6–23)
CO2: 28 mEq/L (ref 19–32)
Calcium: 9.6 mg/dL (ref 8.4–10.5)
Chloride: 104 mEq/L (ref 96–112)
Creatinine, Ser: 1.17 mg/dL (ref 0.40–1.50)
GFR: 61.51 mL/min (ref >60.00)
Glucose, Bld: 97 mg/dL (ref 70–99)
Potassium: 4.7 mEq/L (ref 3.5–5.1)
Sodium: 140 mEq/L (ref 135–145)
Total Bilirubin: 0.6 mg/dL (ref 0.2–1.2)
Total Protein: 6.7 g/dL (ref 6.0–8.3)

## 2019-08-23 LAB — LIPID PANEL
Cholesterol: 171 mg/dL (ref 0–200)
HDL: 35.5 mg/dL — ABNORMAL LOW (ref >39.00)
LDL Cholesterol: 112 mg/dL — ABNORMAL HIGH (ref 0–99)
NonHDL: 135.38
Total CHOL/HDL Ratio: 5
Triglycerides: 118 mg/dL (ref 0.0–149.0)
VLDL: 23.6 mg/dL (ref 0.0–40.0)

## 2019-08-23 LAB — VITAMIN D 25 HYDROXY (VIT D DEFICIENCY, FRACTURES): VITD: 32.76 ng/mL (ref 30.00–100.00)

## 2019-08-23 LAB — VITAMIN B12: Vitamin B-12: 268 pg/mL (ref 211–911)

## 2019-08-23 MED ORDER — DOXYCYCLINE HYCLATE 100 MG PO TABS: 100.0000 mg | ORAL_TABLET | Freq: Two times a day (BID) | ORAL | 0 refills | Status: AC

## 2019-08-23 NOTE — Addendum Note (Signed)
Addended by: Loralyn Freshwater on: 08/23/2019 11:40 AM   Modules accepted: Orders

## 2019-08-23 NOTE — Patient Instructions (Addendum)
°  We have ordered MRI to evaluate your headaches. If you have any new or worsening symptoms please let our office know or go to ED for evaluation.    We have also ordered a Chest x ray due to cough.  At this time we do not have xrays in our clinic. You will have to go to our [place] clinic. The address is [address]  xray is located in the basement.  Hours of operation are M-F 8:30am to 5:00pm.  Closed for lunch between 12:30 and 1:00pm.   With the antibiotic that we have called in for you try to avoid extended time in the sun. Could cause a rash if extended sun exposure. Make sure you drink lots of water as well.   Continue your vitamin D supplement. We will check with your labs today   We will see you for your follow up you have scheduled. If anything comes up feel free to give our office a call to be seen. If you are not doing better and MRI is negative and antibiotics did not help call for follow up sooner.   Call Cardiology with your shortness of breath they may want to see you sooner.

## 2019-08-23 NOTE — Progress Notes (Signed)
Phone [phone] In person visit   Subjective:   Robert David is a 69 y.o. year old very pleasant male patient who presents for/with See problem oriented charting Chief Complaint  Patient presents with  . Fatigue    Headaches,coughing   This visit occurred during the SARS-CoV-2 public health emergency.  Safety protocols were in place, including screening questions prior to the visit, additional usage of staff PPE, and extensive cleaning of exam room while observing appropriate contact time as indicated for disinfecting solutions.   Past Medical History-  Patient Active Problem List   Diagnosis Date Noted  . Atrial fibrillation ([place]) 05/26/2009    Priority: High  . Essential hypertension 03/25/2019    Priority: Medium  . Vitamin D deficiency 01/25/2018    Priority: Medium  . BPH associated with nocturia 06/22/2017    Priority: Medium  . Hyperglycemia 09/01/2014    Priority: Medium  . Insomnia 02/24/2014    Priority: Medium  . Diastolic dysfunction 27/04/5007    Priority: Medium  . Hyperlipidemia 05/11/2007    Priority: Medium  . Contact dermatitis 09/01/2014    Priority: Low  . Former smoker 02/24/2014    Priority: Low  . Chest pain 07/18/2012    Priority: Low  . GERD (gastroesophageal reflux disease) 07/18/2012    Priority: Low  . Prepatellar bursitis, right knee 06/22/2017  . Displaced bimalleolar fracture of right lower leg, subsequent encounter for closed fracture with delayed healing 02/12/2016    Medications- reviewed and updated Current Outpatient Medications  Medication Sig Dispense Refill  . diltiazem (CARDIZEM CD) 240 MG 24 hr capsule Take 1 capsule (240 mg total) by mouth daily. 30 capsule 5  . ELIQUIS 5 MG TABS tablet TAKE 1 TABLET(5 MG) BY MOUTH TWICE DAILY 60 tablet 6  . tamsulosin (FLOMAX) 0.4 MG CAPS capsule Take 1 capsule (0.4 mg total) by mouth daily. 30 capsule 5  . doxycycline (VIBRA-TABS) 100 MG tablet Take 1 tablet (100 mg total) by mouth 2  (two) times daily for 7 days. 14 tablet 0   No current facility-administered medications for this visit.     Objective:  BP 112/82   Pulse 64   Temp 98.5 F (36.9 C)   Ht 5\' 11"  (1.803 m)   Wt 207 lb 6.1 oz (94.1 kg)   SpO2 97%   BMI 28.92 kg/m   CV: Regular rate Lungs: nonlabored, normal respiratory rate Abdomen: soft/nondistended  Ext: no edema Skin: warm, dry Neuro: CN II-XII intact, sensation and reflexes normal throughout, 5/5 muscle strength in bilateral upper and lower extremities. Normal finger to nose. Normal rapid alternating movements. No pronator drift. Normal romberg. Normal gait.      Assessment and Plan   # Headache/coughing S: patient fully immunized from covid 19 and has had covid 19.   He states he just hasn't felt well off and on for 6 months. He has been having a lot of headaches- switched to new glasses and had to switch back. With new glasses yesterday got shooting pains on his left eye shooting towards back of head and then last night was right sided. Some brain fog. Has increased water intake to make sure not dehydrated. Feels off at least half the days of the week.   NO history of headaches. No head trauma. Headaches over 6 month period- slight worsening with time . No facial or extremity weakness. No slurred words or trouble swallowing. no blurry vision or double vision (other than glasses adjustment). No paresthesias.  No confusion or word finding difficulties. Slightly more forgetful. Tylenol does help the headaches  Reports lower energy. Recently had visit in late June and reassuring workup with cbc, cmp, a1c, tsh. Prediabetes still present. UA trace intact blood but urine microscopic was negative for blood  Feels like stress could contribute. Coughing some phlegm up at times- seems to be sticky. Tried OTC allergy pills- claritin and flonase- no significant improvement. 3 months. Possible sinus pr  Patient lost his son June 06 2019 to possible MI .  Wonders if covid 19 has contributed as well.  Depression screen [facility] 2/9 08/23/2019 01/10/2019 12/18/2018  Decreased Interest 0 0 0  Down, Depressed, Hopeless 0 0 0  PHQ - 2 Score 0 0 0  Altered sleeping 0 - -  Tired, decreased energy 2 - -  Change in appetite 0 - -  Feeling bad or failure about yourself  0 - -  Trouble concentrating 0 - -  Moving slowly or fidgety/restless 0 - -  Suicidal thoughts 0 - -  PHQ-9 Score 2 - -  Difficult doing work/chores Not difficult at all - -  A/P: 69 year old male with fatigue, new onset headaches over the last 3 months, chronic cough for the last 3 months, fatigue.  Fatigue work-up last month with CBC, CMP, TSH, UA, A1c largely reassuring other than prediabetes.  No significant change in symptoms but we will update labs and add b12 and vitamin D-With new headache pattern in a patient over age 21 I do think we need to obtain an MRI of the brain to rule out mass. Brother died of brain tumor -MRI in 05-09-2009 had some headaches at that time- MRA with no aneurysm so we wil not do MRA  With the chronic cough he also mentions some mild shortness of breath-he had plan to discuss with cardiology.  We did agree to at least get a chest x-ray today to rule out pneumonia or mass in this area.  With chronicity of cough I also think it is reasonable to trial a course of antibiotics with green/yellow phlegm.  Trial doxycyline for 7 days-could have a lingering sinusitis with sinus pressure and this would give respiratory coverage in case had a bacterial bronchitis.  - reasonable to see cardiology   # Atrial fibrillation S: Rate controlled with  diltiazem 240mg  XR Anticoagulated with  eliquis 5 mg A/P: Stable. Continue current medications.   # BPH S:flomax seems to be helping . Off saw palmetto A/P: Stable. Continue current medications.   #Vitamin D deficiency S: Medication: compliant with 4000 units per day Last vitamin D Lab Results  Component Value Date   VD25OH 24.74  (L) 01/10/2019  A/P: I do not think low vitamin d is the cause of his symptoms-recheck today  #hyperlipidemia S: Medication:none. Working on weight loss and trying metamucil - wants to see if #s improved  Lab Results  Component Value Date   CHOL 192 01/10/2019   HDL 39.70 01/10/2019   LDLCALC 137 (H) 01/10/2019   LDLDIRECT 141.3 03/23/2009   TRIG 80.0 01/10/2019   CHOLHDL 5 01/10/2019   A/P:  Update lipids today- had 3 sips of creamer  #hypertension S: medication: controlled without meds BP Readings from Last 3 Encounters:  08/23/19 112/82  07/31/19 110/80  06/03/19 138/90  A/P: controlled without meds  Recommended follow up: December follow up or sooner if needed. Schedule visit if not doing better and no cause on MRI and antibiotics do not improve symptoms  Future Appointments  Date Time Provider [place]  11/27/2019  8:20 AM Satira Sark, MD [facility]  01/14/2020  8:20 AM Marin Olp, MD [facility]   Lab/Order associations:   ICD-10-CM   1. Chronic cough  R05 DG Chest 2 View  2. Other fatigue  R53.83 TSH    Vitamin B12  3. Chronic nonintractable headache, unspecified headache type  R51.9 MR Brain Wo Contrast   G89.29   4. Permanent atrial fibrillation (HCC)  I48.21   5. Essential hypertension  I10 CBC with Differential/Platelet    Comprehensive metabolic panel  6. Vitamin D deficiency  E55.9 VITAMIN D 25 Hydroxy (Vit-D Deficiency, Fractures)  7. Hyperlipidemia, unspecified hyperlipidemia type  E78.5 CBC with Differential/Platelet    Comprehensive metabolic panel    Lipid panel   Meds ordered this encounter  Medications  . doxycycline (VIBRA-TABS) 100 MG tablet    Sig: Take 1 tablet (100 mg total) by mouth 2 (two) times daily for 7 days.    Dispense:  14 tablet    Refill:  0   Return precautions advised.  Garret Reddish, MD

## 2019-09-23 ENCOUNTER — Ambulatory Visit
Admission: RE | Admit: 2019-09-23 | Discharge: 2019-09-23 | Disposition: A | Discharge status: Home / Self Care | Payer: 59 | Source: Ambulatory Visit | Attending: Family Medicine | Admitting: Family Medicine

## 2019-09-23 DIAGNOSIS — R519 Headache, unspecified: Secondary | ICD-10-CM

## 2019-10-15 ENCOUNTER — Encounter: Payer: Self-pay | Admitting: Family Medicine

## 2019-10-21 ENCOUNTER — Other Ambulatory Visit: Payer: Self-pay

## 2019-10-21 ENCOUNTER — Encounter: Payer: Self-pay | Admitting: Family Medicine

## 2019-10-21 ENCOUNTER — Ambulatory Visit (INDEPENDENT_AMBULATORY_CARE_PROVIDER_SITE_OTHER): Payer: 59 | Admitting: Family Medicine

## 2019-10-21 VITALS — BP 110/70 | HR 62 | Temp 98.1°F | Resp 18 | Ht 71.0 in | Wt 207.0 lb

## 2019-10-21 DIAGNOSIS — R1032 Left lower quadrant pain: Secondary | ICD-10-CM | POA: Diagnosis not present

## 2019-10-21 NOTE — Patient Instructions (Addendum)
Health Maintenance Due  Topic Date Due  . INFLUENZA VACCINE Wants to wait til flu season gets closer. Wants to get his flu shot at [facility]. End [place] mychart message after you get this.  09/08/2019   We will call you within two weeks about your referral for CT abd/pelvis . If you do not hear within 3 weeks, give [place] a call.   If you have new or worsening symptoms let us know.   Recommended follow up: Return in about 1 month (around 11/20/2019) for as needed for new, worsening, persistent symptoms.  Please stop by lab before you go If you have mychart- we will send your results within 3 business days of [place] receiving them.  If you do not have mychart- we will call you about results within 5 business days of [place] receiving them.  *please note we are currently using [facility] which has a longer processing time than [facility] typically so labs may not come back as quickly as in the past *please also note that you will see labs on mychart as soon as they post. I will later go in and write notes on them- will say "notes from Dr. Yong Channel"   Also pick up stool card sand bring these back for [place]

## 2019-10-21 NOTE — Progress Notes (Signed)
Phone [phone] In person visit   Subjective:   Robert David is a 68 y.o. year old very pleasant male patient who presents for/with See problem oriented charting Chief Complaint  Patient presents with  . Abdominal Pain   This visit occurred during the SARS-CoV-2 public health emergency.  Safety protocols were in place, including screening questions prior to the visit, additional usage of staff PPE, and extensive cleaning of exam room while observing appropriate contact time as indicated for disinfecting solutions.   Past Medical History-  Patient Active Problem List   Diagnosis Date Noted  . Atrial fibrillation ([place]) 05/26/2009    Priority: High  . Essential hypertension 03/25/2019    Priority: Medium  . Vitamin D deficiency 01/25/2018    Priority: Medium  . BPH associated with nocturia 06/22/2017    Priority: Medium  . Hyperglycemia 09/01/2014    Priority: Medium  . Insomnia 02/24/2014    Priority: Medium  . Diastolic dysfunction 54/65/0354    Priority: Medium  . Hyperlipidemia 05/11/2007    Priority: Medium  . Contact dermatitis 09/01/2014    Priority: Low  . Former smoker 02/24/2014    Priority: Low  . Chest pain 07/18/2012    Priority: Low  . GERD (gastroesophageal reflux disease) 07/18/2012    Priority: Low  . Prepatellar bursitis, right knee 06/22/2017  . Displaced bimalleolar fracture of right lower leg, subsequent encounter for closed fracture with delayed healing 02/12/2016    Medications- reviewed and updated Current Outpatient Medications  Medication Sig Dispense Refill  . diltiazem (CARDIZEM CD) 240 MG 24 hr capsule Take 1 capsule (240 mg total) by mouth daily. 30 capsule 5  . ELIQUIS 5 MG TABS tablet TAKE 1 TABLET(5 MG) BY MOUTH TWICE DAILY 60 tablet 6  . tamsulosin (FLOMAX) 0.4 MG CAPS capsule Take 1 capsule (0.4 mg total) by mouth daily. 30 capsule 5   No current facility-administered medications for this visit.     Objective:  BP 110/70    Pulse 62   Temp 98.1 F (36.7 C) (Temporal)   Resp 18   Ht 5\' 11"  (1.803 m)   Wt 207 lb (93.9 kg)   SpO2 97%   BMI 28.87 kg/m  Gen: NAD, resting comfortably CV: RRR no murmurs rubs or gallops Lungs: CTAB no crackles, wheeze, rhonchi Abdomen: soft/nontender except for moderate pain with deep palpation in left lower quadrant/nondistended/normal bowel sounds. No rebound or guarding.  Ext: no edema Skin: warm, dry     Assessment and Plan  #LLQ Abdominal Pain S: Left lower abdominal pain intermittent for at least 2- 2.5-3 months (has not mentioned previously). He stated that it comes and goes. 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 Thought gas pain at first. Can double him over in pain. 10-15 minutes. Intensity up to 10/10. Has radiated to the back a few times to left low bak.   No pain at present. He stated that he had the sharp pain on September 4th He stated that when he has the pain it normally radiates to his back.   Moderate diverticulosis on colonoscopy 05/20/14 with 10 year repeat. Bowel movement 3-4 x a day- at his baseline. Doesn't do anything in particular to make it better- not like he is passing gas for instance.   Treated with doxycycline at July visit and felt like less frequent since then. Had already had episodes of this at 7/16 visit and labs showed normal  cbc, cmp. In June had ua with trace blood but urine microscopic wa snormal. No burning with peeing or frequent urination.   Prior reported fatigue from June has been better. Prior lightheadedness has improved.  A/P: 69 year old male with history of diverticulosis with 2 to possibly 3 months of left lower quadrant pain-intermittent severe episodes lasting 15 minutes.  On exam he still has residual moderate tenderness despite last episode occurring about 10 days ago.  I wonder about a smoldering diverticulitis.  We will also get stool cards to make sure no  blood in the stool-would refer to GI if positive for blood.  With duration of symptoms and pain on exam also want to rule out obvious malignancy with CT abdomen/pelvis-this will also allow [place] to evaluate for diverticulitis.  Recommended follow up: Return in about 1 month (around 11/20/2019) for as needed for new, worsening, persistent symptoms.  Emergent precautions discussed Future Appointments  Date Time Provider [place]  11/27/2019  2:00 PM Satira Sark, MD [facility]  01/14/2020  8:20 AM Yong Channel Brayton Mars, MD [facility]    Lab/Order associations:   ICD-10-CM   1. LLQ pain  R10.32 CT Abdomen Pelvis W Contrast    CBC With Differential/Platelet    COMPLETE METABOLIC PANEL WITH GFR    Fecal occult blood, imunochemical    Time Spent: 25 minutes of total time (10:29 AM- 10:54 AM) was spent on the date of the encounter performing the following actions: chart review prior to seeing the patient, obtaining history, performing a medically necessary exam, counseling on the treatment plan, placing orders, and documenting in our EHR.   Return precautions advised.  Garret Reddish, MD

## 2019-10-22 LAB — CBC WITH DIFFERENTIAL/PLATELET
Absolute Monocytes: 442 cells/uL (ref 200–950)
Basophils Absolute: 31 cells/uL (ref 0–200)
Basophils Relative: 0.6 %
Eosinophils Absolute: 109 cells/uL (ref 15–500)
Eosinophils Relative: 2.1 %
HCT: 44.2 % (ref 38.5–50.0)
Hemoglobin: 14.6 g/dL (ref 13.2–17.1)
Lymphs Abs: 1513 cells/uL (ref 850–3900)
MCH: 29 pg (ref 27.0–33.0)
MCHC: 33 g/dL (ref 32.0–36.0)
MCV: 87.9 fL (ref 80.0–100.0)
MPV: 10 fL (ref 7.5–12.5)
Monocytes Relative: 8.5 %
Neutro Abs: 3104 cells/uL (ref 1500–7800)
Neutrophils Relative %: 59.7 %
Platelets: 264 10*3/uL (ref 140–400)
RBC: 5.03 10*6/uL (ref 4.20–5.80)
RDW: 13.2 % (ref 11.0–15.0)
Total Lymphocyte: 29.1 %
WBC: 5.2 10*3/uL (ref 3.8–10.8)

## 2019-10-22 LAB — COMPLETE METABOLIC PANEL WITH GFR
AG Ratio: 2.2 (calc) (ref 1.0–2.5)
ALT: 27 U/L (ref 9–46)
AST: 23 U/L (ref 10–35)
Albumin: 4.6 g/dL (ref 3.6–5.1)
Alkaline phosphatase (APISO): 44 U/L (ref 35–144)
BUN: 19 mg/dL (ref 7–25)
CO2: 26 mmol/L (ref 20–32)
Calcium: 9.3 mg/dL (ref 8.6–10.3)
Chloride: 106 mmol/L (ref 98–110)
Creat: 1.09 mg/dL (ref 0.70–1.18)
GFR, Est African American: 79 mL/min/{1.73_m2} (ref >=60)
GFR, Est Non African American: 68 mL/min/{1.73_m2} (ref >=60)
Globulin: 2.1 g/dL (calc) (ref 1.9–3.7)
Glucose, Bld: 95 mg/dL (ref 65–99)
Potassium: 4.1 mmol/L (ref 3.5–5.3)
Sodium: 140 mmol/L (ref 135–146)
Total Bilirubin: 0.7 mg/dL (ref 0.2–1.2)
Total Protein: 6.7 g/dL (ref 6.1–8.1)

## 2019-11-01 ENCOUNTER — Other Ambulatory Visit (INDEPENDENT_AMBULATORY_CARE_PROVIDER_SITE_OTHER): Payer: 59

## 2019-11-01 DIAGNOSIS — R1032 Left lower quadrant pain: Secondary | ICD-10-CM | POA: Diagnosis not present

## 2019-11-01 LAB — FECAL OCCULT BLOOD, IMMUNOCHEMICAL: Fecal Occult Bld: NEGATIVE

## 2019-11-06 ENCOUNTER — Ambulatory Visit (INDEPENDENT_AMBULATORY_CARE_PROVIDER_SITE_OTHER)
Admission: RE | Admit: 2019-11-06 | Discharge: 2019-11-06 | Disposition: A | Discharge status: Home / Self Care | Payer: 59 | Source: Ambulatory Visit | Attending: Family Medicine | Admitting: Family Medicine

## 2019-11-06 ENCOUNTER — Other Ambulatory Visit: Payer: Self-pay

## 2019-11-06 DIAGNOSIS — R1032 Left lower quadrant pain: Secondary | ICD-10-CM | POA: Diagnosis not present

## 2019-11-06 MED ORDER — IOHEXOL 300 MG/ML  SOLN
100.0000 mL | Freq: Once | INTRAMUSCULAR | Status: AC | PRN
  Administered 2019-11-06: 100 mL via INTRAVENOUS

## 2019-11-07 ENCOUNTER — Encounter: Payer: Self-pay | Admitting: Family Medicine

## 2019-11-07 DIAGNOSIS — I700 Atherosclerosis of aorta: Secondary | ICD-10-CM | POA: Insufficient documentation

## 2019-11-13 ENCOUNTER — Other Ambulatory Visit: Payer: Self-pay | Admitting: Family Medicine

## 2019-11-18 ENCOUNTER — Ambulatory Visit
Admission: EM | Admit: 2019-11-18 | Discharge: 2019-11-18 | Disposition: A | Discharge status: Home / Self Care | Payer: 59 | Attending: Emergency Medicine | Admitting: Emergency Medicine

## 2019-11-18 ENCOUNTER — Other Ambulatory Visit: Payer: Self-pay

## 2019-11-18 DIAGNOSIS — Z1152 Encounter for screening for COVID-19: Secondary | ICD-10-CM

## 2019-11-18 DIAGNOSIS — R059 Cough, unspecified: Secondary | ICD-10-CM

## 2019-11-18 DIAGNOSIS — J0140 Acute pansinusitis, unspecified: Secondary | ICD-10-CM | POA: Diagnosis not present

## 2019-11-18 MED ORDER — BENZONATATE 100 MG PO CAPS: 100.0000 mg | ORAL_CAPSULE | Freq: Three times a day (TID) | ORAL | 0 refills | Status: DC

## 2019-11-18 MED ORDER — AMOXICILLIN-POT CLAVULANATE 875-125 MG PO TABS: 1.0000 | ORAL_TABLET | Freq: Two times a day (BID) | ORAL | 0 refills | Status: AC

## 2019-11-18 NOTE — ED Triage Notes (Signed)
Pt presents with c/o nasal congestion that began last week, pt also states has one area in right rib that hurts with cough

## 2019-11-18 NOTE — ED Provider Notes (Signed)
Robert David   [id number] 11/18/19 Arrival Time: 2683   CC: Sinus infection  SUBJECTIVE: History from: patient.  Robert David is a 69 y.o. male who presents with sinus pain/ pressure and cough x 1 week.  Wife with similar symptoms.  Tested negative for COVID.  Has tried OTC medications without relief.  Denies aggravating factors.  Reports previous symptoms in the past with sinus infection.  Does admit to previous COVID infection, states symptoms are not similar.  Denies fever, chills, fatigue, sore throat, SOB, wheezing, chest pain, nausea, changes in bowel or bladder habits.     ROS: As per HPI.  All other pertinent ROS negative.     Past Medical History:  Diagnosis Date   Headache(784.0)    Hyperlipidemia    Persistent atrial fibrillation ([place])    Seasonal allergies    Past Surgical History:  Procedure Laterality Date   CARDIOVERSION N/A 05/12/2017   Procedure: CARDIOVERSION;  Surgeon: Fay Records, MD;  Location: [place];  Service: Cardiovascular;  Laterality: N/A;   COLONOSCOPY     LEFT HEART CATHETERIZATION WITH CORONARY ANGIOGRAM N/A 07/19/2012   Procedure: LEFT HEART CATHETERIZATION WITH CORONARY ANGIOGRAM;  Surgeon: Minus Breeding, MD;  Location: [facility] CATH LAB;  Service: Cardiovascular;  Laterality: N/A;   ORIF ANKLE FRACTURE Right 02/17/2016   Procedure: OPEN REDUCTION INTERNAL FIXATION (ORIF) BIMALLEOLAR ANKLE FRACTURE;  Surgeon: Leandrew Koyanagi, MD;  Location: [place];  Service: Orthopedics;  Laterality: Right;   POLYPECTOMY     No Known Allergies No current facility-administered medications on file prior to encounter.   Current Outpatient Medications on File Prior to Encounter  Medication Sig Dispense Refill   diltiazem (CARDIZEM CD) 240 MG 24 hr capsule TAKE 1 CAPSULE(240 MG) BY MOUTH DAILY 30 capsule 5   ELIQUIS 5 MG TABS tablet TAKE 1 TABLET(5 MG) BY MOUTH TWICE DAILY 60 tablet 6   tamsulosin (FLOMAX) 0.4 MG CAPS capsule  Take 1 capsule (0.4 mg total) by mouth daily. 30 capsule 5   Social History   Socioeconomic History   Marital status: Married    Spouse name: Not on file   Number of children: Not on file   Years of education: Not on file   Highest education level: Not on file  Occupational History   Not on file  Tobacco Use   Smoking status: Former Smoker    Packs/day: 1.00    Years: 10.00    Pack years: 10.00    Types: Cigarettes    Quit date: 10/24/1988    Years since quitting: 31.0   Smokeless tobacco: Never Used  Vaping Use   Vaping Use: Never used  Substance and Sexual Activity   Alcohol use: No    Alcohol/week: 0.0 standard drinks   Drug use: No   Sexual activity: Not on file  Other Topics Concern   Not on file  Social History Narrative   Married (wife [name] (bobby gail) also Dr. Yong Channel patient) 51. 3 step children (2 living). 3 stepgrandchildren.       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       Hobbies: bowling, golf, exercise    Social Determinants of Health   Financial Resource Strain:    Difficulty of Paying Living Expenses: Not on file  Food Insecurity:    Worried About Charity fundraiser in the Last Year: Not on file   [employer] of Food in the Last Year: Not on  file  Transportation Needs:    Film/video editor (Medical): Not on file   Lack of Transportation (Non-Medical): Not on file  Physical Activity:    Days of Exercise per Week: Not on file   Minutes of Exercise per Session: Not on file  Stress:    Feeling of Stress : Not on file  Social Connections:    Frequency of Communication with Friends and Family: Not on file   Frequency of Social Gatherings with Friends and Family: Not on file   Attends Religious Services: Not on file   Active Member of Clubs or Organizations: Not on file   Attends Archivist Meetings: Not on file   Marital Status: Not on file  Intimate Partner Violence:    Fear of Current or  Ex-Partner: Not on file   Emotionally Abused: Not on file   Physically Abused: Not on file   Sexually Abused: Not on file   Family History  Problem Relation Age of Onset   Cancer Mother    Breast cancer Mother    Heart disease Father        hx A-Fib, 28 with MI   Colon cancer Father 73       metastatic to lung   Rectal cancer Neg Hx    Stomach cancer Neg Hx     OBJECTIVE:  Vitals:   11/18/19 1036  BP: (!) 148/103  Pulse: 86  Resp: 18  Temp: 98 F (36.7 C)  SpO2: 98%     General appearance: alert; appears fatigued, but nontoxic; speaking in full sentences and tolerating own secretions HEENT: NCAT; Ears: EACs clear, TMs pearly gray; Eyes: PERRL.  EOM grossly intact. Sinuses: TTP; Nose: nares patent without rhinorrhea, Throat: oropharynx clear, tonsils non erythematous or enlarged, uvula midline  Neck: supple without LAD Lungs: unlabored respirations, symmetrical air entry; cough: mild; no respiratory distress; CTAB Heart: regular rate and rhythm.   Skin: warm and dry Psychological: alert and cooperative; normal mood and affect  ASSESSMENT & PLAN:  1. Encounter for screening for COVID-19   2. Acute non-recurrent pansinusitis   3. Cough     Meds ordered this encounter  Medications   amoxicillin-clavulanate (AUGMENTIN) 875-125 MG tablet    Sig: Take 1 tablet by mouth every 12 (twelve) hours for 10 days.    Dispense:  20 tablet    Refill:  0    Order Specific Question:   Supervising Provider    Answer:   Raylene Everts [[id number]]   benzonatate (TESSALON) 100 MG capsule    Sig: Take 1 capsule (100 mg total) by mouth every 8 (eight) hours.    Dispense:  21 capsule    Refill:  0    Order Specific Question:   Supervising Provider    Answer:   Raylene Everts [[id number]]     COVID testing ordered.  It will take between 5-7 days for test results.  Someone will contact you regarding abnormal results.    In the meantime: You should remain isolated in  your home for 10 days from symptom onset AND greater than 72 hours after symptoms resolution (absence of fever without the use of fever-reducing medication and improvement in respiratory symptoms), whichever is longer Get plenty of rest and push fluids Tessalon Perles prescribed for cough Augmentin prescribed.  Take as directed and to completion Use OTC zyrtec for nasal congestion, runny nose, and/or sore throat Use OTC flonase for nasal congestion and runny nose Use medications daily  for symptom relief Use OTC medications like ibuprofen or tylenol as needed fever or pain Call or go to the ED if you have any new or worsening symptoms such as fever, worsening cough, shortness of breath, chest tightness, chest pain, turning blue, changes in mental status, etc...   Reviewed expectations re: course of current medical issues. Questions answered. Outlined signs and symptoms indicating need for more acute intervention. Patient verbalized understanding. After Visit Summary given.         Lestine Box, PA-C 11/18/19 1057

## 2019-11-18 NOTE — Discharge Instructions (Addendum)
COVID testing ordered.  It will take between 5-7 days for test results.  Someone will contact you regarding abnormal results.    In the meantime: You should remain isolated in your home for 10 days from symptom onset AND greater than 72 hours after symptoms resolution (absence of fever without the use of fever-reducing medication and improvement in respiratory symptoms), whichever is longer Get plenty of rest and push fluids Tessalon Perles prescribed for cough Augmentin prescribed.  Take as directed and to completion Use OTC zyrtec for nasal congestion, runny nose, and/or sore throat Use OTC flonase for nasal congestion and runny nose Use medications daily for symptom relief Use OTC medications like ibuprofen or tylenol as needed fever or pain Call or go to the ED if you have any new or worsening symptoms such as fever, worsening cough, shortness of breath, chest tightness, chest pain, turning blue, changes in mental status, etc..[name] Kitchen

## 2019-11-19 LAB — NOVEL CORONAVIRUS, NAA: SARS-CoV-2, NAA: NOT DETECTED

## 2019-11-19 LAB — SARS-COV-2, NAA 2 DAY TAT

## 2019-11-26 NOTE — Progress Notes (Signed)
Cardiology Office Note  Date: 11/27/2019   ID: Robert David, DOB May 19, 1950, MRN [id number]  PCP:  Marin Olp, MD  Cardiologist:  Rozann Lesches, MD Electrophysiologist:  None   Chief Complaint  Patient presents with  . Cardiac follow-up    History of Present Illness: Robert David is a 69 y.o. male last seen in April.  He presents for a routine visit.  Reports doing well, no sense of palpitations or functional limitation.  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  Also goes to the [facility] 2 days a week.  I reviewed his recent lab work as noted below.  Hemoglobin and renal function were normal.  He remains on Cardizem CD along with Eliquis, no reported spontaneous bleeding problems or changes in stool.  Past Medical History:  Diagnosis Date  . Headache(784.0)   . Hyperlipidemia   . Persistent atrial fibrillation ([employer])   . Seasonal allergies     Past Surgical History:  Procedure Laterality Date  . CARDIOVERSION N/A 05/12/2017   Procedure: CARDIOVERSION;  Surgeon: Fay Records, MD;  Location: [facility] ENDOSCOPY;  Service: Cardiovascular;  Laterality: N/A;  . COLONOSCOPY    . LEFT HEART CATHETERIZATION WITH CORONARY ANGIOGRAM N/A 07/19/2012   Procedure: LEFT HEART CATHETERIZATION WITH CORONARY ANGIOGRAM;  Surgeon: Minus Breeding, MD;  Location: [facility] CATH LAB;  Service: Cardiovascular;  Laterality: N/A;  . ORIF ANKLE FRACTURE Right 02/17/2016   Procedure: OPEN REDUCTION INTERNAL FIXATION (ORIF) BIMALLEOLAR ANKLE FRACTURE;  Surgeon: Leandrew Koyanagi, MD;  Location: [place];  Service: Orthopedics;  Laterality: Right;  . POLYPECTOMY      Current Outpatient Medications  Medication Sig Dispense Refill  . amoxicillin-clavulanate (AUGMENTIN) 875-125 MG tablet Take 1 tablet by mouth every 12 (twelve) hours for 10 days. 20 tablet 0  . diltiazem (CARDIZEM CD) 240 MG 24 hr capsule TAKE 1 CAPSULE(240 MG) BY MOUTH DAILY 30 capsule 5  . ELIQUIS 5 MG TABS tablet TAKE 1  TABLET(5 MG) BY MOUTH TWICE DAILY 60 tablet 6  . tamsulosin (FLOMAX) 0.4 MG CAPS capsule Take 1 capsule (0.4 mg total) by mouth daily. 30 capsule 5   No current facility-administered medications for this visit.   Allergies:  Patient has no known allergies.   ROS:  No syncope.  Physical Exam: VS:  BP 122/64   Pulse 72   Ht 5\' 11"  (1.803 m)   Wt 207 lb (93.9 kg)   SpO2 97%   BMI 28.87 kg/m , BMI Body mass index is 28.87 kg/m.  Wt Readings from Last 3 Encounters:  11/27/19 207 lb (93.9 kg)  10/21/19 207 lb (93.9 kg)  08/23/19 207 lb 6.1 oz (94.1 kg)    General: Patient appears comfortable at rest. HEENT: Conjunctiva and lids normal, wearing a mask. Neck: Supple, no elevated JVP or carotid bruits, no thyromegaly. Lungs: Clear to auscultation, nonlabored breathing at rest. Cardiac: Irregularly irregular, no S3, no pericardial rub.  Extremities: No pitting edema, distal pulses 2+.  ECG:  An ECG dated 06/03/2019 was personally reviewed today and demonstrated:  Rate controlled atrial fibrillation.  Recent Labwork: 08/23/2019: TSH 1.45 10/21/2019: ALT 27; AST 23; BUN 19; Creat 1.09; Hemoglobin 14.6; Platelets 264; Potassium 4.1; Sodium 140     Component Value Date/Time   CHOL 171 08/23/2019 0905   TRIG 118.0 08/23/2019 0905   HDL 35.50 (L) 08/23/2019 0905   CHOLHDL 5 08/23/2019 0905   VLDL 23.6 08/23/2019 0905   LDLCALC  112 (H) 08/23/2019 0905   LDLDIRECT 141.3 03/23/2009 1148    Other Studies Reviewed Today:  Echocardiogram 04/03/2017: Study Conclusions  - Left ventricle: The cavity size was normal. Wall thickness was normal. Systolic function was normal. The estimated ejection fraction was in the range of 50% to 55%. Wall motion was normal; there were no regional wall motion abnormalities. - Right atrium: The atrium was mildly dilated.  Assessment and Plan:  1.  Permanent atrial fibrillation.  He is doing well without any sense of palpitations and has good  heart rate control on current dose of Cardizem CD.  CHA2DS2-VASc score is 2, continue Eliquis for stroke prophylaxis.  I reviewed his recent lab work.  2.  Diet managed hyperlipidemia.  Recent LDL 112.  Keep follow-up with Dr. Yong Channel.  Medication Adjustments/Labs and Tests Ordered: Current medicines are reviewed at length with the patient today.  Concerns regarding medicines are outlined above.   Tests Ordered: No orders of the defined types were placed in this encounter.   Medication Changes: No orders of the defined types were placed in this encounter.   Disposition:  Follow up 6 months.  Signed, Satira Sark, MD, [facility] 11/27/2019 2:31 PM    [facility] [address] [place], [place] [address] Phone: [phone]; Fax: [phone]

## 2019-11-27 ENCOUNTER — Encounter: Payer: Self-pay | Admitting: Cardiology

## 2019-11-27 ENCOUNTER — Other Ambulatory Visit: Payer: Self-pay

## 2019-11-27 ENCOUNTER — Ambulatory Visit: Payer: 59 | Admitting: Cardiology

## 2019-11-27 VITALS — BP 122/64 | HR 72 | Ht 71.0 in | Wt 207.0 lb

## 2019-11-27 DIAGNOSIS — I4821 Permanent atrial fibrillation: Secondary | ICD-10-CM | POA: Diagnosis not present

## 2019-11-27 NOTE — Patient Instructions (Signed)
Medication Instructions:  Your physician recommends that you continue on your current medications as directed. Please refer to the Current Medication list given to you today.  *If you need a refill on your cardiac medications before your next appointment, please call your pharmacy*   Lab Work: NONE TODAY If you have labs (blood work) drawn today and your tests are completely normal, you will receive your results only by: [name] Kitchen MyChart Message (if you have MyChart) OR . A paper copy in the mail If you have any lab test that is abnormal or we need to change your treatment, we will call you to review the results.   Testing/Procedures: NONE TODAY   Follow-Up: At [facility], you and your health needs are our priority.  As part of our continuing mission to provide you with exceptional heart care, we have created designated Provider Care Teams.  These Care Teams include your primary Cardiologist (physician) and Advanced Practice Providers (APPs -  Physician Assistants and Nurse Practitioners) who all work together to provide you with the care you need, when you need it.  We recommend signing up for the patient portal called "MyChart".  Sign up information is provided on this After Visit Summary.  MyChart is used to connect with patients for Virtual Visits (Telemedicine).  Patients are able to view lab/test results, encounter notes, upcoming appointments, etc.  Non-urgent messages can be sent to your provider as well.   To learn more about what you can do with MyChart, go to [email or web address].    Your next appointment:   6 month(s)  The format for your next appointment:   In Person  Provider:   Rozann Lesches, MD   Other Instructions NONE    Thank you for choosing [facility] !

## 2019-12-24 ENCOUNTER — Telehealth: Payer: Self-pay | Admitting: Internal Medicine

## 2019-12-24 NOTE — Telephone Encounter (Signed)
Robert David is there now with [name] -> he too wants his covid IgG blood test checked  Thanks  MR

## 2020-01-12 NOTE — Patient Instructions (Addendum)
  Please stop by lab before you go If you have mychart- we will send your results within 3 business days of [place] receiving them.  If you do not have mychart- we will call you about results within 5 business days of [place] receiving them.  *please note we are currently using [facility] which has a longer processing time than  typically so labs may not come back as quickly as in the past *please also note that you will see labs on mychart as soon as they post. I will later go in and write notes on them- will say "notes from Dr. Yong Channel"  Try flomax 0.8 mg (two of the 0.4mg )

## 2020-01-12 NOTE — Progress Notes (Signed)
Phone: [phone]   Subjective:  Patient presents today for their annual physical. Chief complaint-noted.   See problem oriented charting- ROS- full  review of systems was completed and negative  except for: LLQ pain, urinary frequency, nocturia   The following were reviewed and entered/updated in epic: Past Medical History:  Diagnosis Date  . Headache(784.0)   . Hyperlipidemia   . Persistent atrial fibrillation ([place])   . Seasonal allergies    Patient Active Problem List   Diagnosis Date Noted  . Atrial fibrillation ([place]) 05/26/2009    Priority: High  . Aortic atherosclerosis ([place]) 11/07/2019    Priority: Medium  . Essential hypertension 03/25/2019    Priority: Medium  . Vitamin D deficiency 01/25/2018    Priority: Medium  . BPH associated with nocturia 06/22/2017    Priority: Medium  . Hyperglycemia 09/01/2014    Priority: Medium  . Insomnia 02/24/2014    Priority: Medium  . Diastolic dysfunction 24/10/7351    Priority: Medium  . Hyperlipidemia 05/11/2007    Priority: Medium  . Contact dermatitis 09/01/2014    Priority: Low  . Former smoker 02/24/2014    Priority: Low  . Chest pain 07/18/2012    Priority: Low  . GERD (gastroesophageal reflux disease) 07/18/2012    Priority: Low  . Prepatellar bursitis, right knee 06/22/2017  . Displaced bimalleolar fracture of right lower leg, subsequent encounter for closed fracture with delayed healing 02/12/2016   Past Surgical History:  Procedure Laterality Date  . CARDIOVERSION N/A 05/12/2017   Procedure: CARDIOVERSION;  Surgeon: Fay Records, MD;  Location: [facility] ENDOSCOPY;  Service: Cardiovascular;  Laterality: N/A;  . COLONOSCOPY    . LEFT HEART CATHETERIZATION WITH CORONARY ANGIOGRAM N/A 07/19/2012   Procedure: LEFT HEART CATHETERIZATION WITH CORONARY ANGIOGRAM;  Surgeon: Minus Breeding, MD;  Location: [facility] CATH LAB;  Service: Cardiovascular;  Laterality: N/A;  . ORIF ANKLE FRACTURE Right 02/17/2016   Procedure: OPEN  REDUCTION INTERNAL FIXATION (ORIF) BIMALLEOLAR ANKLE FRACTURE;  Surgeon: Leandrew Koyanagi, MD;  Location: [place];  Service: Orthopedics;  Laterality: Right;  . POLYPECTOMY      Family History  Problem Relation Age of Onset  . Cancer Mother   . Breast cancer Mother   . Heart disease Father        hx A-Fib, 45 with MI  . Colon cancer Father 62       metastatic to lung  . Rectal cancer Neg Hx   . Stomach cancer Neg Hx     Medications- reviewed and updated Current Outpatient Medications  Medication Sig Dispense Refill  . diltiazem (CARDIZEM CD) 240 MG 24 hr capsule TAKE 1 CAPSULE(240 MG) BY MOUTH DAILY 30 capsule 5  . ELIQUIS 5 MG TABS tablet TAKE 1 TABLET(5 MG) BY MOUTH TWICE DAILY 60 tablet 6  . tamsulosin (FLOMAX) 0.4 MG CAPS capsule Take 2 capsules (0.8 mg total) by mouth daily. 60 capsule 5   No current facility-administered medications for this visit.    Allergies-reviewed and updated No Known Allergies  Social History   Social History Narrative   Married (wife [name] (bobby gail) also Dr. Yong Channel patient) [phone]. 3 step children (2 living). 3 stepgrandchildren.       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       Hobbies: bowling, golf, exercise    Objective  Objective:  BP 138/82   Pulse 86   Temp 98 F (36.7 C) (Temporal)   Ht 5\' 11"  (1.803  m)   Wt 205 lb 12.8 oz (93.4 kg)   SpO2 98%   BMI 28.70 kg/m  Gen: NAD, resting comfortably HEENT: Mucous membranes are moist. Oropharynx normal Neck: no thyromegaly CV: RRR no murmurs rubs or gallops Lungs: CTAB no crackles, wheeze, rhonchi Abdomen: soft/nontender/nondistended/normal bowel sounds. No rebound or guarding.  Ext: no edema Skin: warm, dry Neuro: grossly normal, moves all extremities, PERRLA Rectal: normal tone, normal sized prostate, no masses or tenderness    Assessment and Plan  69 y.o. male presenting for annual physical.  Health Maintenance counseling: 1. Anticipatory  guidance: Patient counseled regarding regular dental exams -q6 months, eye exams -yearly,  avoiding smoking and second hand smoke , limiting alcohol to 2 beverages per day - does not drink.   2. Risk factor reduction:  Advised patient of need for regular exercise and diet rich and fruits and vegetables to reduce risk of heart attack and stroke. Exercise- Wednesday and Friday and averaging 9-12k steps a day. Diet-down 2 lbs from last year. Trying to limit sweets. Had gotten up to 210 and back to 204 on home scales.  Also working on portion sizes. .  Wt Readings from Last 3 Encounters:  01/14/20 205 lb 12.8 oz (93.4 kg)  11/27/19 207 lb (93.9 kg)  10/21/19 207 lb (93.9 kg)  3. Immunizations/screenings/ancillary studies-already had flu shot.  Vaccinated from COVID-19 and discussed could also get his booster at this point plus already had covid last year- still plans on booster soon.  Discussed Shingrix- next year likely  Immunization History  Administered Date(s) Administered  . Influenza Split 10/20/2011  . Influenza Whole 11/26/2008  . Influenza, High Dose Seasonal PF 01/09/2015, 03/14/2016, 11/14/2017, 10/08/2018  . Influenza,inj,Quad PF,6+ Mos 01/21/2013  . Influenza-Unspecified 01/21/2014, 01/05/2017, 11/14/2017, 12/30/2019  . Moderna SARS-COVID-2 Vaccination 05/01/2019, 05/29/2019  . Pneumococcal Conjugate-13 03/09/2015  . Pneumococcal-Unspecified 01/21/2014  . Td 06/04/2008  . Tdap 01/10/2019  . Zoster 10/11/2011   4. Prostate cancer screening- BPH with nocturia on Flomax.  Continue to trend PSA with labs per patient preference given overall good health.  Nocturia 2-3 times a night stable- see discussion below.  Saw palmetto was not helpful. Updated exam per patient preference today Lab Results  Component Value Date   PSA 1.44 01/10/2019   PSA 1.67 06/26/2017   PSA 1.94 03/14/2016   5. Colon cancer screening - April 2016 with 10-year repeat 6. Skin cancer screening-follows with Dr.  Tarri Glenn yearly. advised regular sunscreen use or  Covers up. Denies worrisome, changing, or new skin lesions.  7. Former smoker-quit smoking 30 years ago.  Have discussed AAA screening in past but he now had CT abd/pelvis on 11/06/19 with no aneurysm- no further follow up needed  8. STD screening - not required as monogamous  Status of chronic or acute concerns   # social update-  lost brother in law at 62 , son at 59  #Left lower quadrant pain-patient seen October 21, 2019.  CBC and CMP were normal.  Fecal occult blood negative for blood.  CT scan at that time negative for acute cause.  Patient with diverticulosis but without diverticulitis.  Intermittent severe episodes lasting 15 minutes  In past- now much more sparing and more mild and more common after eating- offered GI referral he wants to hold off for now  # Atrial fibrillation-follows with Dr. Johnny Bridge S: Rate controlled with diltiazem 240 mg extended release Anticoagulated with Eliquis 5 mg twice daily A/P: Appropriately anticoagulated and rate  controlled-continue current medication   #hypertension S: medication: Cardizem 240MG   BP Readings from Last 3 Encounters:  01/14/20 138/82  11/27/19 122/64  11/18/19 (!) 148/103  A/P: High normal but controlled-continue current medication. Thinks traffic contributed- 128 this AM at home.   #hyperlipidemia S: Medication: none Lab Results  Component Value Date   CHOL 171 08/23/2019   HDL 35.50 (L) 08/23/2019   LDLCALC 112 (H) 08/23/2019   LDLDIRECT 141.3 03/23/2009   TRIG 118.0 08/23/2019   CHOLHDL 5 08/23/2019   A/P: ASCVD risk of 26.7% and high risk range-we discussed statin versus coronary CT versus continued efforts for lifestyle changes.  He would like to see where his numbers are before making this decision.-Order direct LDL today  #Vitamin D deficiency S: Medication: 4000 units/day last visit has come off since that time Last vitamin D Lab Results  Component Value  Date   VD25OH 32.76 08/23/2019  A/P: Hopefully controlled-update vitamin D level today-may need to use 1000 units more consistently even once we bumped the levels back up  #BPH-Flomax helpful at first - not as helpful now getting up twice a night- prior 3-4 x. Wants to see if can get more benefit- try 0.8mg .  Off saw palmetto   #burning/tingling in feet- prior agent orange exposure. Issues for many years. [facility] . I am happy to do workup if needed- declined tsh, b12 today. Compression stocks can help some. Worse in evening.   # Hyperglycemia/insulin resistance/prediabetes S:  Medication: none Exercise and diet- see above Lab Results  Component Value Date   HGBA1C 5.9 07/31/2019   HGBA1C 5.8 01/10/2019   HGBA1C 5.9 01/25/2018   A/P: update a1c and continue to trend. This also could be agent orange related.   Recommended follow up: Return in about 1 year (around 01/13/2021) for physical or sooner if needed (as long as home BP <140/90). Future Appointments  Date Time Provider [place]  05/28/2020  9:20 AM Satira Sark, MD [facility]   Lab/Order associations: fasting   ICD-10-CM   1. Preventative health care  Z00.00 LDL cholesterol, direct    PSA    CBC With Differential/Platelet    COMPLETE METABOLIC PANEL WITH GFR    VITAMIN D 25 Hydroxy (Vit-D Deficiency, Fractures)  2. Essential hypertension  I10   3. Gastroesophageal reflux disease without esophagitis  K21.9   4. Former smoker  Z87.891   5. Hyperglycemia  R73.9   6. Hyperlipidemia, unspecified hyperlipidemia type  E78.5 LDL cholesterol, direct    CBC With Differential/Platelet    COMPLETE METABOLIC PANEL WITH GFR  7. Vitamin D deficiency  E55.9 VITAMIN D 25 Hydroxy (Vit-D Deficiency, Fractures)  8. BPH associated with nocturia  N40.1 PSA   R35.1     Meds ordered this encounter  Medications  . tamsulosin (FLOMAX) 0.4 MG CAPS capsule    Sig: Take 2  capsules (0.8 mg total) by mouth daily.    Dispense:  60 capsule    Refill:  5    Return precautions advised.  Garret Reddish, MD

## 2020-01-14 ENCOUNTER — Ambulatory Visit (INDEPENDENT_AMBULATORY_CARE_PROVIDER_SITE_OTHER): Payer: 59 | Admitting: Family Medicine

## 2020-01-14 ENCOUNTER — Other Ambulatory Visit: Payer: Self-pay

## 2020-01-14 ENCOUNTER — Encounter: Payer: Self-pay | Admitting: Family Medicine

## 2020-01-14 VITALS — BP 138/82 | HR 86 | Temp 98.0°F | Ht 71.0 in | Wt 205.8 lb

## 2020-01-14 DIAGNOSIS — R739 Hyperglycemia, unspecified: Secondary | ICD-10-CM | POA: Diagnosis not present

## 2020-01-14 DIAGNOSIS — Z87891 Personal history of nicotine dependence: Secondary | ICD-10-CM

## 2020-01-14 DIAGNOSIS — I10 Essential (primary) hypertension: Secondary | ICD-10-CM

## 2020-01-14 DIAGNOSIS — N401 Enlarged prostate with lower urinary tract symptoms: Secondary | ICD-10-CM

## 2020-01-14 DIAGNOSIS — Z0000 Encounter for general adult medical examination without abnormal findings: Secondary | ICD-10-CM

## 2020-01-14 DIAGNOSIS — I700 Atherosclerosis of aorta: Secondary | ICD-10-CM

## 2020-01-14 DIAGNOSIS — E559 Vitamin D deficiency, unspecified: Secondary | ICD-10-CM

## 2020-01-14 DIAGNOSIS — R351 Nocturia: Secondary | ICD-10-CM

## 2020-01-14 DIAGNOSIS — K219 Gastro-esophageal reflux disease without esophagitis: Secondary | ICD-10-CM

## 2020-01-14 DIAGNOSIS — E785 Hyperlipidemia, unspecified: Secondary | ICD-10-CM

## 2020-01-14 MED ORDER — TAMSULOSIN HCL 0.4 MG PO CAPS
0.8000 mg | ORAL_CAPSULE | Freq: Every day | ORAL | 5 refills | Status: DC
Start: 2020-01-14 — End: 2020-07-27

## 2020-01-15 LAB — COMPLETE METABOLIC PANEL WITH GFR
AG Ratio: 1.9 (calc) (ref 1.0–2.5)
ALT: 27 U/L (ref 9–46)
AST: 23 U/L (ref 10–35)
Albumin: 4.6 g/dL (ref 3.6–5.1)
Alkaline phosphatase (APISO): 50 U/L (ref 35–144)
BUN/Creatinine Ratio: 15 (calc) (ref 6–22)
BUN: 19 mg/dL (ref 7–25)
CO2: 26 mmol/L (ref 20–32)
Calcium: 9.7 mg/dL (ref 8.6–10.3)
Chloride: 104 mmol/L (ref 98–110)
Creat: 1.28 mg/dL — ABNORMAL HIGH (ref 0.70–1.18)
GFR, Est African American: 65 mL/min/{1.73_m2} (ref >=60)
GFR, Est Non African American: 56 mL/min/{1.73_m2} — ABNORMAL LOW (ref >=60)
Globulin: 2.4 g/dL (calc) (ref 1.9–3.7)
Glucose, Bld: 94 mg/dL (ref 65–99)
Potassium: 4.4 mmol/L (ref 3.5–5.3)
Sodium: 141 mmol/L (ref 135–146)
Total Bilirubin: 0.8 mg/dL (ref 0.2–1.2)
Total Protein: 7 g/dL (ref 6.1–8.1)

## 2020-01-15 LAB — CBC WITH DIFFERENTIAL/PLATELET
Absolute Monocytes: 573 cells/uL (ref 200–950)
Basophils Absolute: 38 cells/uL (ref 0–200)
Basophils Relative: 0.6 %
Eosinophils Absolute: 221 cells/uL (ref 15–500)
Eosinophils Relative: 3.5 %
HCT: 45.6 % (ref 38.5–50.0)
Hemoglobin: 15 g/dL (ref 13.2–17.1)
Lymphs Abs: 1789 cells/uL (ref 850–3900)
MCH: 28.7 pg (ref 27.0–33.0)
MCHC: 32.9 g/dL (ref 32.0–36.0)
MCV: 87.4 fL (ref 80.0–100.0)
MPV: 10 fL (ref 7.5–12.5)
Monocytes Relative: 9.1 %
Neutro Abs: 3679 cells/uL (ref 1500–7800)
Neutrophils Relative %: 58.4 %
Platelets: 258 10*3/uL (ref 140–400)
RBC: 5.22 10*6/uL (ref 4.20–5.80)
RDW: 12.9 % (ref 11.0–15.0)
Total Lymphocyte: 28.4 %
WBC: 6.3 10*3/uL (ref 3.8–10.8)

## 2020-01-15 LAB — PSA: PSA: 1.25 ng/mL (ref <=4.0)

## 2020-01-15 LAB — LDL CHOLESTEROL, DIRECT: Direct LDL: 117 mg/dL — ABNORMAL HIGH (ref <100)

## 2020-01-15 LAB — HEMOGLOBIN A1C
Hgb A1c MFr Bld: 5.7 % of total Hgb — ABNORMAL HIGH (ref <5.7)
Mean Plasma Glucose: 117 mg/dL
eAG (mmol/L): 6.5 mmol/L

## 2020-01-15 LAB — VITAMIN D 25 HYDROXY (VIT D DEFICIENCY, FRACTURES): Vit D, 25-Hydroxy: 24 ng/mL — ABNORMAL LOW (ref 30–100)

## 2020-01-15 MED ORDER — VITAMIN D (ERGOCALCIFEROL) 1.25 MG (50000 UNIT) PO CAPS: 50000.0000 [IU] | ORAL_CAPSULE | ORAL | 0 refills | Status: DC

## 2020-01-15 NOTE — Addendum Note (Signed)
Addended by: Marin Olp on: 01/15/2020 05:13 PM   Modules accepted: Orders

## 2020-01-25 ENCOUNTER — Other Ambulatory Visit: Payer: Self-pay | Admitting: Cardiology

## 2020-02-13 ENCOUNTER — Ambulatory Visit: Payer: 59 | Attending: Internal Medicine

## 2020-02-13 DIAGNOSIS — Z23 Encounter for immunization: Secondary | ICD-10-CM

## 2020-02-13 NOTE — Progress Notes (Signed)
   Covid-19 Vaccination Clinic  Name:  Robert David    MRN: [id number] DOB: July 13, 1950  02/13/2020  [name] was observed post Covid-19 immunization for 15 minutes without incident. He was provided with Vaccine Information Sheet and instruction to access the V-Safe system.   [name] was instructed to call 911 with any severe reactions post vaccine: [name] Kitchen Difficulty breathing  . Swelling of face and throat  . A fast heartbeat  . A bad rash all over body  . Dizziness and weakness   Immunizations Administered    Name Date Dose VIS Date Route   Moderna Covid-19 Booster Vaccine 02/13/2020  1:51 PM 0.25 mL 11/27/2019 Intramuscular   Manufacturer: Gala Murdoch   Lot: [id number]   [place]: 62563-893-73

## 2020-05-16 ENCOUNTER — Other Ambulatory Visit: Payer: Self-pay | Admitting: Family Medicine

## 2020-05-27 NOTE — Progress Notes (Signed)
Cardiology Office Note  Date: 05/28/2020   ID: Robert David, DOB 14-Jun-1950, MRN [id number]  PCP:  Marin Olp, MD  Cardiologist:  Rozann Lesches, MD Electrophysiologist:  None   Chief Complaint  Patient presents with  . Cardiac follow-up    History of Present Illness: Robert David is a 70 y.o. male last seen in October 2021.  He presents for a follow-up visit.  He had questions today about management of atrial fibrillation, we discussed antiarrhythmic therapy and ablation.  He has been in long-term persistent atrial fibrillation with strategy of heart rate control and anticoagulation, failed cardioversion attempt in 2019.  Overall, not particularly bothered with palpitations.  It sounds like his heart rate has not been out of control, sometimes he feels weak with bradycardia.  No syncope.  He had normal coronaries documented at cardiac catheterization in 2014.  I reviewed his interval lab work as noted below.  He does not report any bleeding problems on Eliquis.  I personally reviewed his ECG today which shows rate controlled atrial fibrillation in the 70s.  He remains active, pastors for 2 churches.  Also exercises at the [facility] on Wednesdays and Fridays.  Past Medical History:  Diagnosis Date  . Headache(784.0)   . Hyperlipidemia   . Persistent atrial fibrillation ([place])   . Seasonal allergies     Past Surgical History:  Procedure Laterality Date  . CARDIOVERSION N/A 05/12/2017   Procedure: CARDIOVERSION;  Surgeon: Fay Records, MD;  Location: [facility] ENDOSCOPY;  Service: Cardiovascular;  Laterality: N/A;  . COLONOSCOPY    . LEFT HEART CATHETERIZATION WITH CORONARY ANGIOGRAM N/A 07/19/2012   Procedure: LEFT HEART CATHETERIZATION WITH CORONARY ANGIOGRAM;  Surgeon: Minus Breeding, MD;  Location: [facility] CATH LAB;  Service: Cardiovascular;  Laterality: N/A;  . ORIF ANKLE FRACTURE Right 02/17/2016   Procedure: OPEN REDUCTION INTERNAL FIXATION (ORIF) BIMALLEOLAR ANKLE FRACTURE;   Surgeon: Leandrew Koyanagi, MD;  Location: [place];  Service: Orthopedics;  Laterality: Right;  . POLYPECTOMY      Current Outpatient Medications  Medication Sig Dispense Refill  . diltiazem (CARDIZEM CD) 240 MG 24 hr capsule TAKE 1 CAPSULE(240 MG) BY MOUTH DAILY 30 capsule 5  . ELIQUIS 5 MG TABS tablet TAKE 1 TABLET(5 MG) BY MOUTH TWICE DAILY 60 tablet 6  . tamsulosin (FLOMAX) 0.4 MG CAPS capsule Take 2 capsules (0.8 mg total) by mouth daily. 60 capsule 5   No current facility-administered medications for this visit.   Allergies:  Patient has no known allergies.   ROS: No syncope.  Physical Exam: VS:  BP (!) 152/92   Pulse 79   Ht 6' (1.829 m)   Wt 205 lb (93 kg)   SpO2 96%   BMI 27.80 kg/m , BMI Body mass index is 27.8 kg/m.  Wt Readings from Last 3 Encounters:  05/28/20 205 lb (93 kg)  01/14/20 205 lb 12.8 oz (93.4 kg)  11/27/19 207 lb (93.9 kg)    General: Patient appears comfortable at rest. HEENT: Conjunctiva and lids normal, wearing a mask. Neck: Supple, no elevated JVP or carotid bruits, no thyromegaly. Lungs: Clear to auscultation, nonlabored breathing at rest. Cardiac: Irregularly irregular, no S3 or significant systolic murmur, no pericardial rub. Extremities: No pitting edema.  ECG:  An ECG dated 06/03/2019 was personally reviewed today and demonstrated:  Rate controlled atrial fibrillation.  Recent Labwork: 08/23/2019: TSH 1.45 01/14/2020: ALT 27; AST 23; BUN 19; Creat 1.28; Hemoglobin 15.0; Platelets 258; Potassium 4.4;  Sodium 141     Component Value Date/Time   CHOL 171 08/23/2019 0905   TRIG 118.0 08/23/2019 0905   HDL 35.50 (L) 08/23/2019 0905   CHOLHDL 5 08/23/2019 0905   VLDL 23.6 08/23/2019 0905   LDLCALC 112 (H) 08/23/2019 0905   LDLDIRECT 117 (H) 01/14/2020 0915    Other Studies Reviewed Today:  Echocardiogram 04/03/2017: Study Conclusions  - Left ventricle: The cavity size was normal. Wall thickness was normal. Systolic  function was normal. The estimated ejection fraction was in the range of 50% to 55%. Wall motion was normal; there were no regional wall motion abnormalities. - Right atrium: The atrium was mildly dilated.  Assessment and Plan:  1.  Long-term persistent atrial fibrillation managed as permanent atrial fibrillation so far.  CHA2DS2-VASc score is 2.  He failed cardioversion attempt in 2019.  He did ask about other treatment options today which we discussed as noted above.  Plan is to obtain a follow-up echocardiogram to reassess cardiac structure and function, also left atrial chamber size.  Continue with current medications.  Could always consider referral back to the atrial fibrillation clinic if possibility of antiarrhythmic therapy and cardioversion is to be considered.  2.  Acquired thrombophilia, on Eliquis for stroke prophylaxis with CHA2DS2-VASc score of 2.  He does not report any active bleeding problems.  Last hemoglobin 15.0.  Medication Adjustments/Labs and Tests Ordered: Current medicines are reviewed at length with the patient today.  Concerns regarding medicines are outlined above.   Tests Ordered: Orders Placed This Encounter  Procedures  . EKG 12-Lead  . ECHOCARDIOGRAM COMPLETE    Medication Changes: No orders of the defined types were placed in this encounter.   Disposition:  Follow up 6 months.  Signed, Satira Sark, MD, [facility] 05/28/2020 9:57 AM    [facility] [place] [place], [place], [place] [address] Phone: [phone]; Fax: [phone]

## 2020-05-28 ENCOUNTER — Encounter: Payer: Self-pay | Admitting: Cardiology

## 2020-05-28 ENCOUNTER — Other Ambulatory Visit: Payer: Self-pay

## 2020-05-28 ENCOUNTER — Ambulatory Visit: Payer: 59 | Admitting: Cardiology

## 2020-05-28 VITALS — BP 152/92 | HR 79 | Ht 72.0 in | Wt 205.0 lb

## 2020-05-28 DIAGNOSIS — I4821 Permanent atrial fibrillation: Secondary | ICD-10-CM | POA: Diagnosis not present

## 2020-05-28 DIAGNOSIS — D6869 Other thrombophilia: Secondary | ICD-10-CM

## 2020-05-28 NOTE — Patient Instructions (Addendum)
Your physician recommends that you schedule a follow-up appointment in: [place]  Your physician recommends that you continue on your current medications as directed. Please refer to the Current Medication list given to you today.  Your physician has requested that you have an echocardiogram. Echocardiography is a painless test that uses sound waves to create images of your heart. It provides your doctor with information about the size and shape of your heart and how well your heart's chambers and valves are working. This procedure takes approximately one hour. There are no restrictions for this procedure.  Thank you for choosing [facility]!!

## 2020-06-16 ENCOUNTER — Telehealth: Payer: Self-pay | Admitting: Cardiology

## 2020-06-16 NOTE — Telephone Encounter (Signed)
Pt c/o BP issue:  1. What are your last 5 BP readings? 88/48 HR 50 5/9 (Yesterday)   2. Are you having any other symptoms (ex. Dizziness, headache, blurred vision, passed out)?  Fatigue, lightheadedness when he stands up   3. What is your medication issue?  Patient states that he is feeling some better today.

## 2020-06-16 NOTE — Telephone Encounter (Signed)
New message    Returning call to [facility]

## 2020-06-16 NOTE — Telephone Encounter (Signed)
Thank you for the update.  Agree with observation at this point particularly if he is feeling better and blood pressure has come back up.  He is on Cardizem CD for heart rate control of atrial fibrillation, but has been tolerating it well and we are not specifically using this for blood pressure lowering effect.  Agree with maintaining adequate hydration.

## 2020-06-16 NOTE — Telephone Encounter (Signed)
Pt states that he went shopping with wife he could only walk 10-12 feet without getting lightheaded. Pt went home and checked BP and it was noted to be 88/48 HR 50, later he checked and it was 108/53  HR 65. Pt states that he feels better today and BP is 145/73 HR 55. Pt denies SOB, Chest Pain. Pt will increase water intake. Please Advise.

## 2020-06-16 NOTE — Telephone Encounter (Signed)
I spoke with Robert David and she agrees to monitor BP and keep Mr.Ono hydrated. She will call back if there are any low blood pressures again.

## 2020-06-29 ENCOUNTER — Other Ambulatory Visit: Payer: Self-pay

## 2020-06-29 ENCOUNTER — Other Ambulatory Visit (HOSPITAL_BASED_OUTPATIENT_CLINIC_OR_DEPARTMENT_OTHER): Payer: Self-pay

## 2020-06-29 ENCOUNTER — Ambulatory Visit: Payer: Self-pay | Attending: Internal Medicine

## 2020-06-29 DIAGNOSIS — Z23 Encounter for immunization: Secondary | ICD-10-CM

## 2020-06-29 MED ORDER — COVID-19 MRNA VACC (MODERNA) 100 MCG/0.5ML IM SUSP
INTRAMUSCULAR | 0 refills | Status: DC
  Filled 2020-06-29: qty 0.25, 1d supply, fill #0

## 2020-06-29 NOTE — Progress Notes (Signed)
   Covid-19 Vaccination Clinic  Name:  Robert David    MRN: [id number] DOB: 14-Mar-1950  06/29/2020  [name] was observed post Covid-19 immunization for 15 minutes without incident. He was provided with Vaccine Information Sheet and instruction to access the V-Safe system.   [name] was instructed to call 911 with any severe reactions post vaccine: [name] Kitchen Difficulty breathing  . Swelling of face and throat  . A fast heartbeat  . A bad rash all over body  . Dizziness and weakness   Immunizations Administered    Name Date Dose VIS Date Route   Moderna Covid-19 Booster Vaccine 06/29/2020  1:59 PM 0.25 mL 11/27/2019 Intramuscular   Manufacturer: Moderna   Lot: [id number]   [place]: [id number]

## 2020-07-01 ENCOUNTER — Ambulatory Visit (INDEPENDENT_AMBULATORY_CARE_PROVIDER_SITE_OTHER): Payer: 59

## 2020-07-01 ENCOUNTER — Other Ambulatory Visit: Payer: Self-pay

## 2020-07-01 DIAGNOSIS — I4821 Permanent atrial fibrillation: Secondary | ICD-10-CM | POA: Diagnosis not present

## 2020-07-01 LAB — ECHOCARDIOGRAM COMPLETE
Area-P 1/2: 4.46 cm2
Calc EF: 50.4 %
MV M vel: 4.03 m/s
MV Peak grad: 65 mmHg
S' Lateral: 2.8 cm
Single Plane A2C EF: 54.7 %
Single Plane A4C EF: 45.3 %

## 2020-07-02 ENCOUNTER — Telehealth: Payer: Self-pay | Admitting: *Deleted

## 2020-07-02 NOTE — Telephone Encounter (Signed)
-----   Message from Satira Sark, MD sent at 07/01/2020  4:50 PM EDT ----- Results reviewed.  LVEF normal at 55 to 60%.  Left atrium is severely dilated.  This would suggest maintaining sinus rhythm will be more challenging over time.  I think if he is doing reasonably well with heart rate control strategy we will continue with same.  If he would like another opinion regarding antiarrhythmic therapy and cardioversion in an attempt to maintain sinus rhythm longer-term, we can get him referred to atrial fibrillation clinic.

## 2020-07-02 NOTE — Telephone Encounter (Signed)
Patient informed. Copy sent to PCP °

## 2020-07-27 ENCOUNTER — Other Ambulatory Visit: Payer: Self-pay | Admitting: Family Medicine

## 2020-08-11 ENCOUNTER — Encounter: Payer: Self-pay | Admitting: Family Medicine

## 2020-08-12 ENCOUNTER — Other Ambulatory Visit: Payer: Self-pay

## 2020-08-12 DIAGNOSIS — Z1211 Encounter for screening for malignant neoplasm of colon: Secondary | ICD-10-CM

## 2020-08-14 ENCOUNTER — Other Ambulatory Visit: Payer: Self-pay | Admitting: Family Medicine

## 2020-09-04 ENCOUNTER — Other Ambulatory Visit: Payer: Self-pay | Admitting: Cardiology

## 2020-09-04 NOTE — Telephone Encounter (Signed)
Pt's age 70, wt 11 kg, SCr 1.28, CrCl 1.28, last ov w/ SM 05/28/20.

## 2020-09-18 ENCOUNTER — Encounter: Payer: Self-pay | Admitting: Family Medicine

## 2020-09-18 MED ORDER — AMOXICILLIN-POT CLAVULANATE 875-125 MG PO TABS: 1.0000 | ORAL_TABLET | Freq: Two times a day (BID) | ORAL | 0 refills | Status: AC

## 2020-10-19 NOTE — Telephone Encounter (Signed)
Thank you for letting me know about these recent changes.  With that amount of weight loss, you may simply need less medication to control your heart rate, and the current dose may be having too much of an impact on not only your heart rate but also your blood pressure.  I will let my nurse know that we will cut your Cardizem CD dose back to 120 mg daily for now.

## 2020-10-20 ENCOUNTER — Other Ambulatory Visit: Payer: Self-pay | Admitting: *Deleted

## 2020-10-20 MED ORDER — DILTIAZEM HCL ER COATED BEADS 120 MG PO CP24: 120.0000 mg | ORAL_CAPSULE | Freq: Every day | ORAL | 3 refills | Status: DC

## 2020-12-01 NOTE — Progress Notes (Signed)
Cardiology Office Note  Date: 12/02/2020   ID: Robert David, DOB [birth year], MRN [id number]  PCP:  Marin Olp, MD  Cardiologist:  Rozann Lesches, MD Electrophysiologist:  None   Chief Complaint  Patient presents with   Cardiac follow-up    History of Present Illness: Robert David is a 70 y.o. male last seen in April.  He is here for a follow-up visit.  Doing very well at this point.  Through Weight Watchers he has lost 32 pounds since June.  We cut his Cardizem CD back to 120 mg in the interim with his blood pressure coming down, and ultimately he stop it completely, states that he feels much better.  He has been checking heart rate and blood pressure at home, both look to be in good range.  Follow-up echocardiogram in May revealed LVEF 55 to 60%, severely dilated left atrium.  Plan is to manage his arrhythmia as permanent atrial fibrillation given low likelihood of maintaining sinus rhythm with severe left atrial enlargement.  He remains on Eliquis for stroke prophylaxis with CHA2DS2-VASc score of 2.  He will have lab work with PCP in December.  No spontaneous bleeding problems reported.  Past Medical History:  Diagnosis Date   Headache(784.0)    Hyperlipidemia    Persistent atrial fibrillation ([place])    Seasonal allergies     Past Surgical History:  Procedure Laterality Date   CARDIOVERSION N/A 05/12/2017   Procedure: CARDIOVERSION;  Surgeon: Fay Records, MD;  Location: [place];  Service: Cardiovascular;  Laterality: N/A;   COLONOSCOPY     LEFT HEART CATHETERIZATION WITH CORONARY ANGIOGRAM N/A 07/19/2012   Procedure: LEFT HEART CATHETERIZATION WITH CORONARY ANGIOGRAM;  Surgeon: Minus Breeding, MD;  Location: [facility] CATH LAB;  Service: Cardiovascular;  Laterality: N/A;   ORIF ANKLE FRACTURE Right 02/17/2016   Procedure: OPEN REDUCTION INTERNAL FIXATION (ORIF) BIMALLEOLAR ANKLE FRACTURE;  Surgeon: Leandrew Koyanagi, MD;  Location: [place];  Service:  Orthopedics;  Laterality: Right;   POLYPECTOMY      Current Outpatient Medications  Medication Sig Dispense Refill   ELIQUIS 5 MG TABS tablet TAKE 1 TABLET(5 MG) BY MOUTH TWICE DAILY 60 tablet 6   tamsulosin (FLOMAX) 0.4 MG CAPS capsule TAKE 2 CAPSULES(0.8 MG) BY MOUTH DAILY 60 capsule 5   No current facility-administered medications for this visit.   Allergies:  Patient has no known allergies.   ROS: No orthopnea or PND.  No syncope.  Physical Exam: VS:  BP 138/90   Pulse 63   Ht 5\' 11"  (1.803 m)   Wt 186 lb (84.4 kg)   SpO2 98%   BMI 25.94 kg/m , BMI Body mass index is 25.94 kg/m.  Wt Readings from Last 3 Encounters:  12/02/20 186 lb (84.4 kg)  05/28/20 205 lb (93 kg)  01/14/20 205 lb 12.8 oz (93.4 kg)    General: Patient appears comfortable at rest. HEENT: Conjunctiva and lids normal, wearing a mask. Neck: Supple, no elevated JVP or carotid bruits, no thyromegaly. Lungs: Clear to auscultation, nonlabored breathing at rest. Cardiac: Irregularly irregular, no S3 or significant systolic murmur, no pericardial rub. Extremities: No pitting edema.  ECG:  An ECG dated 05/28/2020 was personally reviewed today and demonstrated:  Atrial fibrillation.  Recent Labwork: 01/14/2020: ALT 27; AST 23; BUN 19; Creat 1.28; Hemoglobin 15.0; Platelets 258; Potassium 4.4; Sodium 141     Component Value Date/Time   CHOL 171 08/23/2019 0905   TRIG 118.0 08/23/2019  7591   HDL 35.50 (L) 08/23/2019 0905   CHOLHDL 5 08/23/2019 0905   VLDL 23.6 08/23/2019 0905   LDLCALC 112 (H) 08/23/2019 0905   LDLDIRECT 117 (H) 01/14/2020 0915    Other Studies Reviewed Today:  Echocardiogram 07/01/2020:  1. Left ventricular ejection fraction, by estimation, is 55 to 60%. The  left ventricle has normal function. The left ventricle has no regional  wall motion abnormalities. Left ventricular diastolic parameters are  indeterminate. The average left  ventricular global longitudinal strain is -17.9 %. The  global longitudinal  strain is normal.   2. Right ventricular systolic function is normal. The right ventricular  size is normal.   3. Left atrial size was severely dilated.   4. The mitral valve is normal in structure. No evidence of mitral valve  regurgitation. No evidence of mitral stenosis.   5. The aortic valve is calcified. There is mild calcification of the  aortic valve. There is mild thickening of the aortic valve. Aortic valve  regurgitation is not visualized. No aortic stenosis is present.   Assessment and Plan:  1.  Permanent atrial fibrillation with CHA2DS2-VASc score of 2.  He is now off AV nodal blocker with heart rate in the 60s to 70s at rest today.  Continue Eliquis for stroke prophylaxis.  He will have follow-up lab work with PCP in December.  Echocardiogram reveals LVEF 55 to 60% with severely dilated left atrium may clean the likelihood of maintaining sinus rhythm long-term fairly low.  2.  Essential hypertension, blood pressure has come down significantly in the setting of weight loss and diet.  He continues to track this at home.  Also keep follow-up with Dr. Yong Channel.  Medication Adjustments/Labs and Tests Ordered: Current medicines are reviewed at length with the patient today.  Concerns regarding medicines are outlined above.   Tests Ordered: No orders of the defined types were placed in this encounter.   Medication Changes: No orders of the defined types were placed in this encounter.   Disposition:  Follow up  6 months.  Signed, Satira Sark, MD, [facility] 12/02/2020 9:05 AM    [facility] [place], [place], [place] [address] Phone: [phone]; Fax: [phone]

## 2020-12-02 ENCOUNTER — Other Ambulatory Visit: Payer: Self-pay

## 2020-12-02 ENCOUNTER — Ambulatory Visit: Payer: 59 | Admitting: Cardiology

## 2020-12-02 ENCOUNTER — Encounter: Payer: Self-pay | Admitting: Cardiology

## 2020-12-02 VITALS — BP 138/90 | HR 63 | Ht 71.0 in | Wt 186.0 lb

## 2020-12-02 DIAGNOSIS — I10 Essential (primary) hypertension: Secondary | ICD-10-CM

## 2020-12-02 DIAGNOSIS — I4821 Permanent atrial fibrillation: Secondary | ICD-10-CM | POA: Diagnosis not present

## 2020-12-02 NOTE — Patient Instructions (Addendum)

## 2020-12-21 ENCOUNTER — Encounter: Payer: Self-pay | Admitting: Family Medicine

## 2021-01-11 NOTE — Progress Notes (Signed)
Phone: [phone]   Subjective:  Patient presents today for their annual physical. Chief complaint-noted.   See problem oriented charting- ROS- full  review of systems was completed and negative  except for: allergies- sneezing, joint pain - fingers with arthritis  The following were reviewed and entered/updated in epic: Past Medical History:  Diagnosis Date   Headache(784.0)    Hyperlipidemia    Persistent atrial fibrillation ([facility])    Seasonal allergies    Patient Active Problem List   Diagnosis Date Noted   Atrial fibrillation ([place]) 05/26/2009    Priority: High   Aortic atherosclerosis ([place]) 11/07/2019    Priority: Medium    Essential hypertension 03/25/2019    Priority: Medium    Vitamin D deficiency 01/25/2018    Priority: Medium    BPH associated with nocturia 06/22/2017    Priority: Medium    Hyperglycemia 09/01/2014    Priority: Medium    Insomnia 02/24/2014    Priority: Medium    Diastolic dysfunction 63/89/3734    Priority: Medium    Hyperlipidemia 05/11/2007    Priority: Medium    Contact dermatitis 09/01/2014    Priority: Low   Former smoker 02/24/2014    Priority: Low   Chest pain 07/18/2012    Priority: Low   GERD (gastroesophageal reflux disease) 07/18/2012    Priority: Low   Prepatellar bursitis, right knee 06/22/2017   Displaced bimalleolar fracture of right lower leg, subsequent encounter for closed fracture with delayed healing 02/12/2016   Past Surgical History:  Procedure Laterality Date   CARDIOVERSION N/A 05/12/2017   Procedure: CARDIOVERSION;  Surgeon: Fay Records, MD;  Location: [place];  Service: Cardiovascular;  Laterality: N/A;   COLONOSCOPY     LEFT HEART CATHETERIZATION WITH CORONARY ANGIOGRAM N/A 07/19/2012   Procedure: LEFT HEART CATHETERIZATION WITH CORONARY ANGIOGRAM;  Surgeon: Minus Breeding, MD;  Location: [facility]ATH LAB;  Service: Cardiovascular;  Laterality: N/A;   ORIF ANKLE FRACTURE Right 02/17/2016   Procedure: OPEN  REDUCTION INTERNAL FIXATION (ORIF) BIMALLEOLAR ANKLE FRACTURE;  Surgeon: Leandrew Koyanagi, MD;  Location: [place];  Service: Orthopedics;  Laterality: Right;   POLYPECTOMY      Family History  Problem Relation Age of Onset   Cancer Mother    Breast cancer Mother    Heart disease Father        hx A-Fib, 53 with MI   Colon cancer Father 87       metastatic to lung   Rectal cancer Neg Hx    Stomach cancer Neg Hx     Medications- reviewed and updated Current Outpatient Medications  Medication Sig Dispense Refill   ELIQUIS 5 MG TABS tablet TAKE 1 TABLET(5 MG) BY MOUTH TWICE DAILY 60 tablet 6   tamsulosin (FLOMAX) 0.4 MG CAPS capsule TAKE 2 CAPSULES(0.8 MG) BY MOUTH DAILY 60 capsule 5   No current facility-administered medications for this visit.    Allergies-reviewed and updated No Known Allergies  Social History   Social History Narrative   Married (wife [name] (bobby gail) also Dr. Yong Channel patient) [phone]. 3 step children (2 living). 3 stepgrandchildren.       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       Hobbies: bowling, golf, exercise    Objective  Objective:  BP 140/80   Pulse 76   Temp 98.2 F (36.8 C)   Ht 5\' 11"  (1.803 m)   Wt 187 lb 6.4 oz (85 kg)  SpO2 98%   BMI 26.14 kg/m  Gen: NAD, resting comfortably HEENT: Mucous membranes are moist. Oropharynx normal Neck: no thyromegaly CV: irregularly irregular no murmurs rubs or gallops Lungs: CTAB no crackles, wheeze, rhonchi Abdomen: soft/nontender/nondistended/normal bowel sounds. No rebound or guarding.  Ext: no edema Skin: warm, dry Neuro: grossly normal, moves all extremities, PERRLA bbbb   Assessment and Plan  70 y.o. male presenting for annual physical.  Health Maintenance counseling: 1. Anticipatory guidance: Patient counseled regarding regular dental exams q6 months, eye exams -yearly ,   avoiding smoking and second hand smoke , limiting alcohol to 2 beverages per day - does  not drink .  No illicit drugs.  2. Risk factor reduction:  Advised patient of need for regular exercise and diet rich and fruits and vegetables to reduce risk of heart attack and stroke.Exercise- Wednesday and Friday and averaging 9-12k steps a day.  Diet/weight management-Down 18 pounds from last year! Weight watchers working well for him. Wt Readings from Last 3 Encounters:  01/18/21 187 lb 6.4 oz (85 kg)  12/02/20 186 lb (84.4 kg)  05/28/20 205 lb (93 kg)  3. Immunizations/screenings/ancillary studies DISCUSSED:  -COVID booster vaccination #3 - discussed bivalent booster -[id number] vaccination - appears likely had Prevnar 13 and Pneumovax 23 but unconfirmed as 1 is listed as unspecified-we discussed Prevnar 20 option-opts out  -Shingrix vaccination #1 - discussed boosting protection from Zostavax with Shingrixat the pharmacy- opts out Immunization History  Administered Date(s) Administered   Influenza Split 10/20/2011   Influenza Whole 11/26/2008   Influenza, High Dose Seasonal PF 01/09/2015, 03/14/2016, 11/14/2017, 10/08/2018, 12/21/2020   Influenza,inj,Quad PF,6+ Mos 01/21/2013   Influenza-Unspecified 01/21/2014, 01/05/2017, 11/14/2017, 12/30/2019   Moderna SARS-COV2 Booster Vaccination 02/13/2020, 06/29/2020   Moderna Sars-Covid-2 Vaccination 05/01/2019, 05/29/2019   Pneumococcal Conjugate-13 03/09/2015   Pneumococcal-Unspecified 01/21/2014   Td 06/04/2008   Tdap 01/10/2019   Zoster, Live 10/11/2011   4. Prostate cancer screening-  BPH with nocturia on Flomax.  Continue to trend PSA with labs per patient preference given overall good health.  Nocturia 2-3 times a night last year- currently 1x or even 0 at times- see discussion below.  Saw palmetto was not helpful. Updated exam per patient preference 12/21 visit.  Lab Results  Component Value Date   PSA 1.25 01/14/2020   PSA 1.44 01/10/2019   PSA 1.67 06/26/2017   5. Colon cancer screening - 05/20/14 with 10 year repeat planned   6. Skin cancer screening- follows with Dr. Tarri Glenn yearly. advised regular sunscreen use. Denies worrisome, changing, or new skin lesions.  7. Smoking associated screening (lung cancer screening, AAA screen 65-75, UA)- Former smoker-quit smoking 30 years ago.  Have discussed AAA screening in past but-had CT abd/pelvis on 11/06/19 with no aneurysm- no further follow up needed   8. 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  CBC and CMP were normal.  Fecal occult blood negative for blood.  CT scan at that time negative for acute cause.  Patient with diverticulosis but without diverticulitis.  Intermittent severe episodes lasting 15 minutes  In past- 12/21, much more sparing and more mild and more common after eating- offered GI referral he wanted to hold off around this time. -Today reports occurs once every few weeks. Maybe an hour of symptoms when comes. Not stopping his activity.  -agrees to GI referral now -does have fatty liver but should not cause pain  # Atrial fibrillation-follows with Dr. Johnny Bridge S: Rate controlled with diltiazem 240 mg extended release in past- off completely now Anticoagulated with Eliquis 5 mg twice daily A/P: appears to be in a fib at present- Appropriately anticoagulated and rate controlled without meds-continue current medication- eliquis only   #hypertension S: medication: Cardizem 240MG  in past- now off completely Home readings #s: 110/60s usually  BP Readings from Last 3 Encounters:  01/18/21 140/80  12/02/20 138/90  05/28/20 (!) 152/92  A/P: doing well without meds now-  continue to monitor  #hyperlipidemia/aortic atherosclerosis S: Medication: none Lab Results  Component Value Date   CHOL 171 08/23/2019   HDL 35.50 (L) 08/23/2019   LDLCALC 112 (H) 08/23/2019   LDLDIRECT 117 (H) 01/14/2020   TRIG 118.0 08/23/2019   CHOLHDL 5 08/23/2019   A/P: Prior ASCVD risk around 25%-he is wanting to continue to work on healthy eating/regular exercise over starting medication-update lipid panel today and recalculate ASCVD risk -plus technically goal is 70 or less on LDL with aortic atherosclerosis- will discuss again after labs come back  #Vitamin D deficiency S: Medication: none right now -4000 units/day last visit had came off since that time Last vitamin D Lab Results  Component Value Date   VD25OH 24 (L) 01/14/2020  A/P: off completely- update vitamin D and see where things stand    #BPH-Flomax helpful when for started- not as helpful now getting up twice a night- prior 3-4 x. Wanted to see if can get more benefit- try 0.8mg  last visit.  Off saw palmetto    #burning/tingling in feet- prior agent orange exposure. Issues for many years. He was going to get [facility] with [facility] . Compression stocks can help some- wears some. Worsened in evening.    # Hyperglycemia/insulin resistance/prediabetes S:  Medication: none Exercise and diet- see above Lab Results  Component Value Date   HGBA1C 5.7 (H) 01/14/2020   HGBA1C 5.9 07/31/2019   HGBA1C 5.8 01/10/2019   A/P: Prediabetes risk has been decreasing with lifestyle changes-update A1c again with labs today  Recommended follow up: No follow-ups on file.  Lab/Order associations: fasting   ICD-10-CM   1. Preventative health care  Z00.00     2. Permanent atrial fibrillation (HCC)  I48.21     3. Aortic atherosclerosis (HCC) Chronic I70.0     4. Essential hypertension  I10     5. Hyperlipidemia, unspecified hyperlipidemia type  E78.5 CBC with Differential/Platelet    Comprehensive metabolic  panel    Lipid panel    6. Hyperglycemia  R73.9 HgB A1c    7. Vitamin D deficiency  E55.9 VITAMIN D 25 Hydroxy (Vit-D Deficiency, Fractures)    8. BPH associated with nocturia  N40.1 PSA   R35.1     9. LLQ pain  R10.32 Ambulatory referral to Gastroenterology     No orders of the defined types were placed in this encounter.  I,Jada Bradford,acting as a scribe for Garret Reddish, MD.,have documented all relevant documentation on the behalf of Garret Reddish, MD,as directed by  Garret Reddish, MD while in the presence of Garret Reddish, MD.   I, Annie Main  Yong Channel, MD, have reviewed all documentation for this visit. The documentation on 01/18/21 for the exam, diagnosis, procedures, and orders are all accurate and complete.   Return precautions advised.  Garret Reddish, MD

## 2021-01-18 ENCOUNTER — Other Ambulatory Visit: Payer: Self-pay

## 2021-01-18 ENCOUNTER — Other Ambulatory Visit: Payer: Self-pay | Admitting: Family Medicine

## 2021-01-18 ENCOUNTER — Ambulatory Visit (INDEPENDENT_AMBULATORY_CARE_PROVIDER_SITE_OTHER): Payer: 59 | Admitting: Family Medicine

## 2021-01-18 ENCOUNTER — Encounter: Payer: Self-pay | Admitting: Family Medicine

## 2021-01-18 VITALS — BP 132/82 | HR 76 | Temp 98.2°F | Ht 71.0 in | Wt 187.4 lb

## 2021-01-18 DIAGNOSIS — R739 Hyperglycemia, unspecified: Secondary | ICD-10-CM

## 2021-01-18 DIAGNOSIS — E785 Hyperlipidemia, unspecified: Secondary | ICD-10-CM | POA: Diagnosis not present

## 2021-01-18 DIAGNOSIS — R351 Nocturia: Secondary | ICD-10-CM

## 2021-01-18 DIAGNOSIS — E559 Vitamin D deficiency, unspecified: Secondary | ICD-10-CM

## 2021-01-18 DIAGNOSIS — I4821 Permanent atrial fibrillation: Secondary | ICD-10-CM | POA: Diagnosis not present

## 2021-01-18 DIAGNOSIS — I10 Essential (primary) hypertension: Secondary | ICD-10-CM | POA: Diagnosis not present

## 2021-01-18 DIAGNOSIS — N401 Enlarged prostate with lower urinary tract symptoms: Secondary | ICD-10-CM | POA: Diagnosis not present

## 2021-01-18 DIAGNOSIS — Z0000 Encounter for general adult medical examination without abnormal findings: Secondary | ICD-10-CM | POA: Diagnosis not present

## 2021-01-18 DIAGNOSIS — I700 Atherosclerosis of aorta: Secondary | ICD-10-CM

## 2021-01-18 DIAGNOSIS — R1032 Left lower quadrant pain: Secondary | ICD-10-CM

## 2021-01-18 LAB — CBC WITH DIFFERENTIAL/PLATELET
Basophils Absolute: 0 10*3/uL (ref 0.0–0.1)
Basophils Relative: 0.7 % (ref 0.0–3.0)
Eosinophils Absolute: 0.2 10*3/uL (ref 0.0–0.7)
Eosinophils Relative: 3.2 % (ref 0.0–5.0)
HCT: 44.4 % (ref 39.0–52.0)
Hemoglobin: 14.5 g/dL (ref 13.0–17.0)
Lymphocytes Relative: 32 % (ref 12.0–46.0)
Lymphs Abs: 1.5 10*3/uL (ref 0.7–4.0)
MCHC: 32.8 g/dL (ref 30.0–36.0)
MCV: 89.4 fl (ref 78.0–100.0)
Monocytes Absolute: 0.4 10*3/uL (ref 0.1–1.0)
Monocytes Relative: 8.3 % (ref 3.0–12.0)
Neutro Abs: 2.7 10*3/uL (ref 1.4–7.7)
Neutrophils Relative %: 55.8 % (ref 43.0–77.0)
Platelets: 215 10*3/uL (ref 150.0–400.0)
RBC: 4.97 Mil/uL (ref 4.22–5.81)
RDW: 13.5 % (ref 11.5–15.5)
WBC: 4.8 10*3/uL (ref 4.0–10.5)

## 2021-01-18 LAB — COMPREHENSIVE METABOLIC PANEL
ALT: 18 U/L (ref 0–53)
AST: 17 U/L (ref 0–37)
Albumin: 4.5 g/dL (ref 3.5–5.2)
Alkaline Phosphatase: 51 U/L (ref 39–117)
BUN: 15 mg/dL (ref 6–23)
CO2: 28 mEq/L (ref 19–32)
Calcium: 9.8 mg/dL (ref 8.4–10.5)
Chloride: 107 mEq/L (ref 96–112)
Creatinine, Ser: 1.07 mg/dL (ref 0.40–1.50)
GFR: 69.53 mL/min (ref >60.00)
Glucose, Bld: 96 mg/dL (ref 70–99)
Potassium: 5 mEq/L (ref 3.5–5.1)
Sodium: 144 mEq/L (ref 135–145)
Total Bilirubin: 0.6 mg/dL (ref 0.2–1.2)
Total Protein: 6.7 g/dL (ref 6.0–8.3)

## 2021-01-18 LAB — LIPID PANEL
Cholesterol: 175 mg/dL (ref 0–200)
HDL: 49.5 mg/dL (ref >39.00)
LDL Cholesterol: 114 mg/dL — ABNORMAL HIGH (ref 0–99)
NonHDL: 125.02
Total CHOL/HDL Ratio: 4
Triglycerides: 55 mg/dL (ref 0.0–149.0)
VLDL: 11 mg/dL (ref 0.0–40.0)

## 2021-01-18 LAB — VITAMIN D 25 HYDROXY (VIT D DEFICIENCY, FRACTURES): VITD: 15.36 ng/mL — ABNORMAL LOW (ref 30.00–100.00)

## 2021-01-18 LAB — PSA: PSA: 1.15 ng/mL (ref 0.10–4.00)

## 2021-01-18 LAB — HEMOGLOBIN A1C: Hgb A1c MFr Bld: 5.8 % (ref 4.6–6.5)

## 2021-01-18 MED ORDER — VITAMIN D (ERGOCALCIFEROL) 1.25 MG (50000 UNIT) PO CAPS: 50000.0000 [IU] | ORAL_CAPSULE | ORAL | 1 refills | Status: DC

## 2021-01-18 NOTE — Patient Instructions (Addendum)
Please stop by lab before you go If you have mychart- we will send your results within 3 business days of [place] receiving them.  If you do not have mychart- we will call you about results within 5 business days of [place] receiving them.  *please also note that you will see labs on mychart as soon as they post. I will later go in and write notes on them- will say "notes from Dr. Yong Channel"  Thrilled you are doing so well!   Recommended follow up: Return in about 1 year (around 01/18/2022) for physical or sooner if needed.

## 2021-01-25 ENCOUNTER — Other Ambulatory Visit: Payer: Self-pay | Admitting: Family Medicine

## 2021-02-12 ENCOUNTER — Encounter: Payer: Self-pay | Admitting: Family Medicine

## 2021-02-19 ENCOUNTER — Encounter: Payer: Self-pay | Admitting: Gastroenterology

## 2021-03-12 ENCOUNTER — Ambulatory Visit (INDEPENDENT_AMBULATORY_CARE_PROVIDER_SITE_OTHER): Payer: 59 | Admitting: Gastroenterology

## 2021-03-12 ENCOUNTER — Encounter: Payer: Self-pay | Admitting: Gastroenterology

## 2021-03-12 VITALS — BP 130/90 | HR 58 | Ht 70.0 in | Wt 187.0 lb

## 2021-03-12 DIAGNOSIS — K760 Fatty (change of) liver, not elsewhere classified: Secondary | ICD-10-CM

## 2021-03-12 DIAGNOSIS — I4821 Permanent atrial fibrillation: Secondary | ICD-10-CM

## 2021-03-12 DIAGNOSIS — R1032 Left lower quadrant pain: Secondary | ICD-10-CM

## 2021-03-12 NOTE — Patient Instructions (Signed)
If you are age 73 or older, your body mass index should be between 23-30. Your Body mass index is 26.83 kg/m. If this is out of the aforementioned range listed, please consider follow up with your Primary Care Provider.  If you are age 65 or younger, your body mass index should be between 19-25. Your Body mass index is 26.83 kg/m. If this is out of the aformentioned range listed, please consider follow up with your Primary Care Provider.    The [facility] providers would like to encourage you to use [facility] to communicate with providers for non-urgent requests or questions.  Due to long hold times on the telephone, sending your provider a message by [facility] may be a faster and more efficient way to get a response.  Please allow 48 business hours for a response.  Please remember that this is for non-urgent requests.   It was a pleasure to see you today!  Thank you for trusting me with your gastrointestinal care!    Scott E.Candis Schatz, MD

## 2021-03-12 NOTE — Progress Notes (Signed)
HPI : Robert David is a very pleasant 71 year old male with a history of A-fib and hyperlipidemia who is referred to [place] by Dr. Garret Reddish for further evaluation of chronic left lower quadrant pain.  Patient states that he has been having this left lower quadrant pain intermittently for over a year now.  The pain is always in the same location, but is not associated with any sort of pattern or identifiable triggers.  He will have at 1 day, then I will be gone for weeks or more.  The last time he experienced the pain was about 3 weeks ago.  Usually, the pain is pretty mild, at worst it has been a 7 out of 10.  The pain is variable in its duration, but can last for hours at a time.  It is described as a sharp sensation.  It is not worsened with meals, and has not improved with passage of stool or gas.  The pain is not associated with any other symptoms such as nausea, vomiting or change in bowel habits.  He reports having regular bowel movements, usually 2/day.  His stools are typically formed and soft.  He takes Metamucil on a daily basis. His father died of colon cancer in his late 77s or early 3s.  The patient's last colonoscopy was in 2016 by Dr. Deatra Ina and was normal, except for sigmoid and ascending colon diverticulosis.  The patient underwent a CT scan in September 2021 to evaluate this abdominal pain.  The CT scan was unremarkable for any cause of left lower quadrant pain.  It did note the presence of diverticulosis as well as fatty liver.  The patient has intentionally lost 30 pounds over the last year through weight watchers program.  He admits to drinking heavily in the distant past, but does not drink essentially at all in the last 30 years.   Past Medical History:  Diagnosis Date   Headache(784.0)    Hyperlipidemia    Persistent atrial fibrillation ([place])    Seasonal allergies    Colonoscopy 2016 (Dr. Deatra Ina): Sigmoid and ascending colon diverticulosis, otherwise normal, repeat in 10  years Colonoscopy 2009 (Dr. Deatra Ina): Diverticulosis, 2 mm polyp in ascending colon  Past Surgical History:  Procedure Laterality Date   CARDIOVERSION N/A 05/12/2017   Procedure: CARDIOVERSION;  Surgeon: Fay Records, MD;  Location: [place];  Service: Cardiovascular;  Laterality: N/A;   COLONOSCOPY     LEFT HEART CATHETERIZATION WITH CORONARY ANGIOGRAM N/A 07/19/2012   Procedure: LEFT HEART CATHETERIZATION WITH CORONARY ANGIOGRAM;  Surgeon: Minus Breeding, MD;  [facility] CATH LAB;  Service: Cardiovascular;  Laterality: N/A;   ORIF ANKLE FRACTURE Right 02/17/2016   Procedure: OPEN REDUCTION INTERNAL FIXATION (ORIF) BIMALLEOLAR ANKLE FRACTURE;  Surgeon: Leandrew Koyanagi, MD;  Location: [place];  Service: Orthopedics;  Laterality: Right;   POLYPECTOMY     Family History  Problem Relation Age of Onset   Cancer Mother    Breast cancer Mother    Heart disease Father        hx A-Fib, 29 with MI   Colon cancer Father 41       metastatic to lung   Rectal cancer Neg Hx    Stomach cancer Neg Hx    Social History   Tobacco Use   Smoking status: Former    Packs/day: 1.00    Years: 10.00    Pack years: 10.00    Types: Cigarettes    Quit date: 10/24/1988  Years since quitting: 32.4   Smokeless tobacco: Never  Vaping Use   Vaping Use: Never used  Substance Use Topics   Alcohol use: No    Alcohol/week: 0.0 standard drinks   Drug use: No   Current Outpatient Medications  Medication Sig Dispense Refill   ELIQUIS 5 MG TABS tablet TAKE 1 TABLET(5 MG) BY MOUTH TWICE DAILY 60 tablet 6   tamsulosin (FLOMAX) 0.4 MG CAPS capsule TAKE 2 CAPSULES(0.8 MG) BY MOUTH DAILY 60 capsule 5   Vitamin D, Ergocalciferol, (DRISDOL) 1.25 MG (50000 UNIT) CAPS capsule Take 1 capsule (50,000 Units total) by mouth every 7 (seven) days. 13 capsule 1   No current facility-administered medications for this visit.   No Known Allergies   Review of Systems: All systems reviewed and negative  except where noted in HPI.    No results found.  Physical Exam: BP 130/90    Pulse (!) 58    Ht 5\' 10"  (1.778 m)    Wt 187 lb (84.8 kg)    BMI 26.83 kg/m  Constitutional: Pleasant,well-developed, Caucasian male in no acute distress. HEENT: Normocephalic and atraumatic. Conjunctivae are normal. No scleral icterus. Neck supple.  Cardiovascular: Normal rate, regular rhythm.  Pulmonary/chest: Effort normal and breath sounds normal. No wheezing, rales or rhonchi. Abdominal: Soft, nondistended, nontender.  Unable to reproduce pain on palpation.  Bowel sounds active throughout. There are no masses palpable. No hepatomegaly. Extremities: no edema Neurological: Alert and oriented to person place and time. Skin: Skin is warm and dry. No rashes noted. Psychiatric: Normal mood and affect. Behavior is normal.  CBC    Component Value Date/Time   WBC 4.8 01/18/2021 0922   RBC 4.97 01/18/2021 0922   HGB 14.5 01/18/2021 0922   HCT 44.4 01/18/2021 0922   PLT 215.0 01/18/2021 0922   MCV 89.4 01/18/2021 0922   MCH 28.7 01/14/2020 0915   MCHC 32.8 01/18/2021 0922   RDW 13.5 01/18/2021 0922   LYMPHSABS 1.5 01/18/2021 0922   MONOABS 0.4 01/18/2021 0922   EOSABS 0.2 01/18/2021 0922   BASOSABS 0.0 01/18/2021 0922    CMP     Component Value Date/Time   NA 144 01/18/2021 0922   K 5.0 01/18/2021 0922   CL 107 01/18/2021 0922   CO2 28 01/18/2021 0922   GLUCOSE 96 01/18/2021 0922   BUN 15 01/18/2021 0922   CREATININE 1.07 01/18/2021 0922   CREATININE 1.28 (H) 01/14/2020 0915   CALCIUM 9.8 01/18/2021 0922   PROT 6.7 01/18/2021 0922   ALBUMIN 4.5 01/18/2021 0922   AST 17 01/18/2021 0922   ALT 18 01/18/2021 0922   ALKPHOS 51 01/18/2021 0922   BILITOT 0.6 01/18/2021 0922   GFRNONAA 56 (L) 01/14/2020 0915   GFRAA 65 01/14/2020 0915   CLINICAL DATA:  Left lower quadrant pain   EXAM: CT ABDOMEN AND PELVIS WITH CONTRAST   TECHNIQUE: Multidetector CT imaging of the abdomen and pelvis was  performed using the standard protocol following bolus administration of intravenous contrast.   CONTRAST:  140mL OMNIPAQUE IOHEXOL 300 MG/ML  SOLN   COMPARISON:  None.   FINDINGS: Lower chest: Lung bases are clear. No effusions. Heart is normal size.   Hepatobiliary: Diffuse low-density throughout the liver compatible with fatty infiltration. No focal abnormality. Gallbladder unremarkable.   Pancreas: No focal abnormality or ductal dilatation.   Spleen: No focal abnormality.  Normal size.   Adrenals/Urinary Tract: Bilateral renal cysts, the largest 3.9 cm in the left midpole. No hydronephrosis. Adrenal  glands and urinary bladder unremarkable.   Stomach/Bowel: Left colonic diverticulosis. No active diverticulitis. Moderate stool burden throughout the colon. Stomach and small bowel decompressed.   Vascular/Lymphatic: Aortoiliac atherosclerosis. No evidence of aneurysm or adenopathy.   Reproductive: No visible focal abnormality.   Other: No free fluid or free air.   Musculoskeletal: No acute bony abnormality.   IMPRESSION: Left colonic diverticulosis.  No active diverticulitis.   Aortic atherosclerosis.   Diffuse fatty infiltration of the liver.     Electronically Signed   By: Rolm Baptise M.D.   On: 11/07/2019 09:50    ASSESSMENT AND PLAN: 71 year old male with atrial fibrillation on Eliquis, with over 1 year history of intermittent left lower quadrant pain without other symptoms.  Last colonoscopy in 2016 without polyps, CT scan in September 2021 without abnormality to explain pain.  The etiology of his pain is unclear at this point.  He is not atypical patient for IBS given new onset at late age.  He has normal bowel habits.  He does not have any red flag symptoms such as unintentional weight loss, blood in the stool or anemia.  He has not had the pain in almost a month.  We discussed doing a colonoscopy to evaluate this pain.  Colonoscopy is reasonable in a  71 year old with new onset abdominal pain.  However, given the duration of his symptoms and the infrequent nature, I think the likelihood of finding an etiology for his pain on a colonoscopy is low.  I discussed this with the patient.  He would like to hold off on pursuing a colonoscopy at this point, given the improvement in his pain, but queried whether would be able to schedule him for colonoscopy if his pain returned.  I informed him that he would just need to call us if the pain return, and we would schedule him for a colonoscopy.  Because the patient is on Eliquis, I informed him that he would need to hold this for 2 days prior to his colonoscopy.  He indicated understanding, and that this would not be a problem.  I offered Bentyl to help with the pain, but he declined. With regards to his fatty liver, he has normal liver enzymes, does not drink, and has lost 30 pounds within the past year.  His fib 4 score is low risk, no need for elastography.  I recommended he continue doing the things that he has done to lose weight.  LLQ pain, chronic, unclear etiology - Observe for now - Colonoscopy if pain returns to exclude mass lesion  Fatty liver - Continue diet changes and exercise as he is already doing to lose weight - Continue to avoid alcohol  Zaeda Mcferran E. Candis Schatz, MD [facility]  CC:  Marin Olp, MD

## 2021-04-10 ENCOUNTER — Other Ambulatory Visit: Payer: Self-pay | Admitting: Cardiology

## 2021-04-12 NOTE — Telephone Encounter (Signed)
Prescription refill request for Eliquis received. ?Indication: afib  ?Last office visit: [place], 12/02/2020 ?Scr: 1.07, 01/18/2021 ?Age: 71 yo  ?Weight: 84.8 kg  ? ?Refill sent.  ?

## 2021-06-21 IMAGING — DX DG CHEST 2V
2 series · 2 of 2 positions shown · non-contrast
Comparison: September 15, 2017.

CLINICAL DATA: Cough.

EXAM:
CHEST - 2 VIEW

[chest pa]
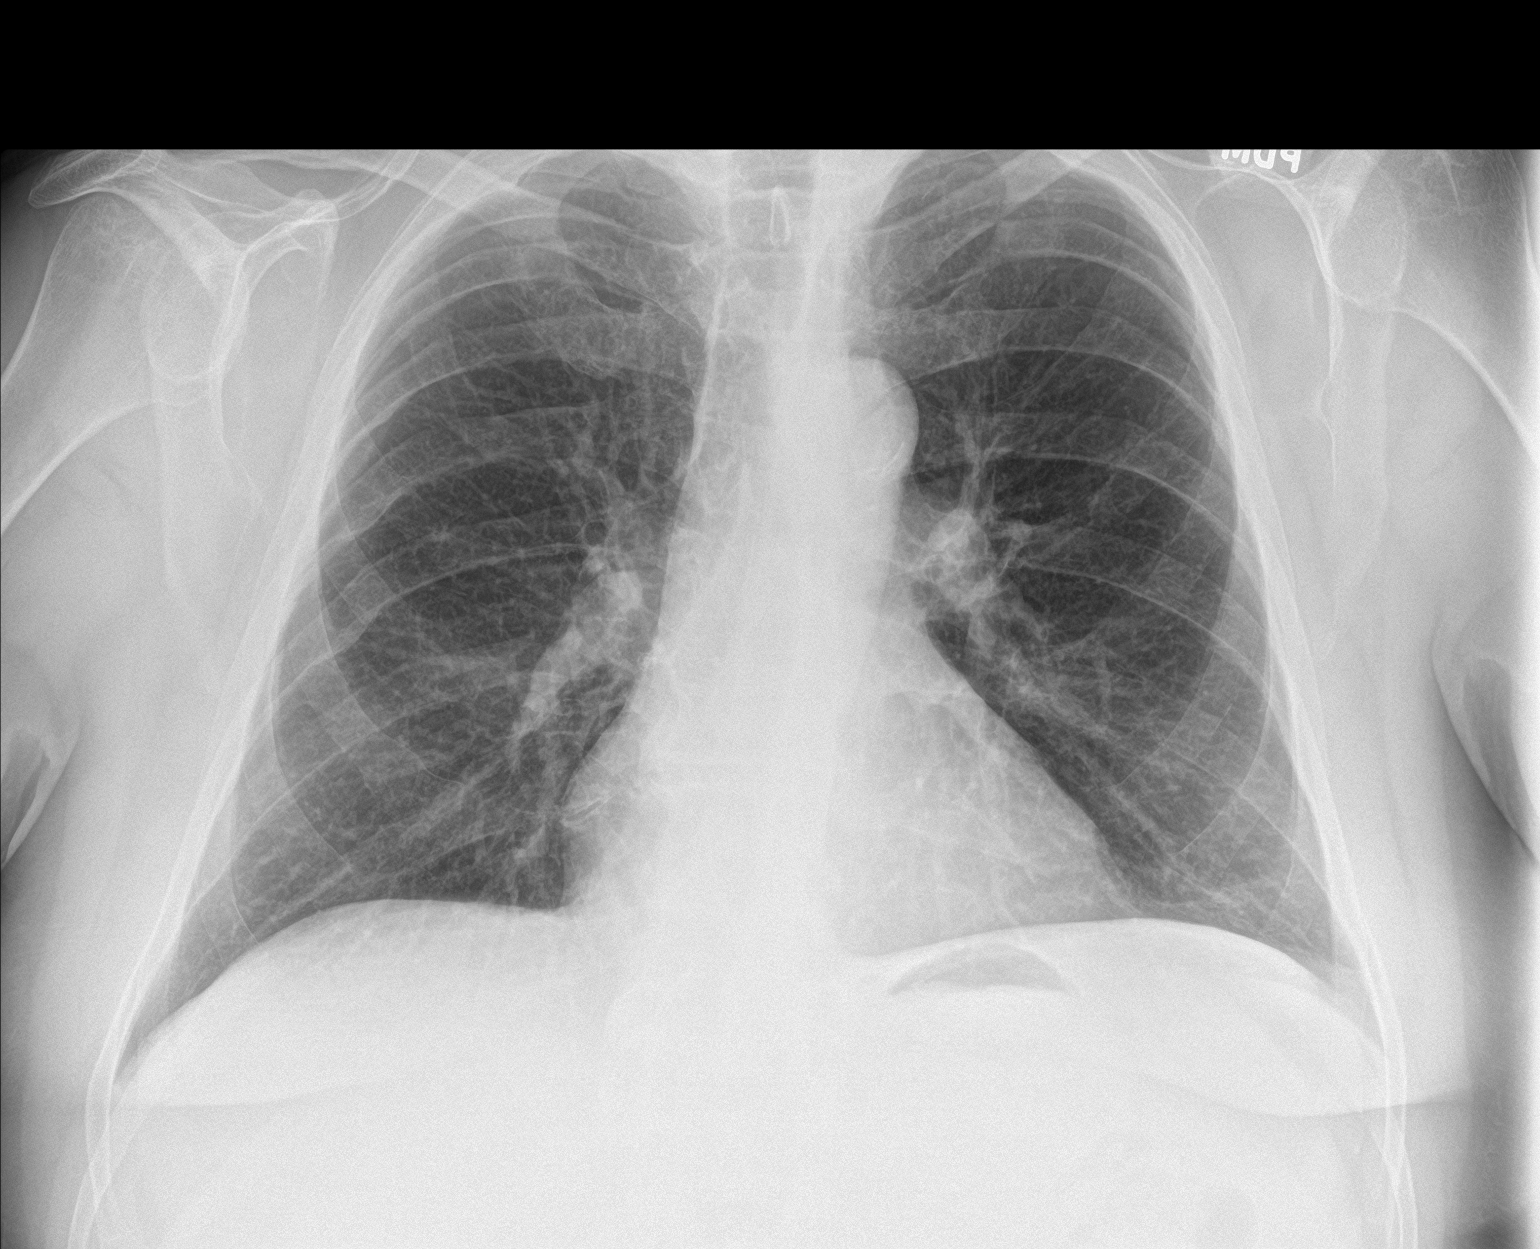

[chest lat]
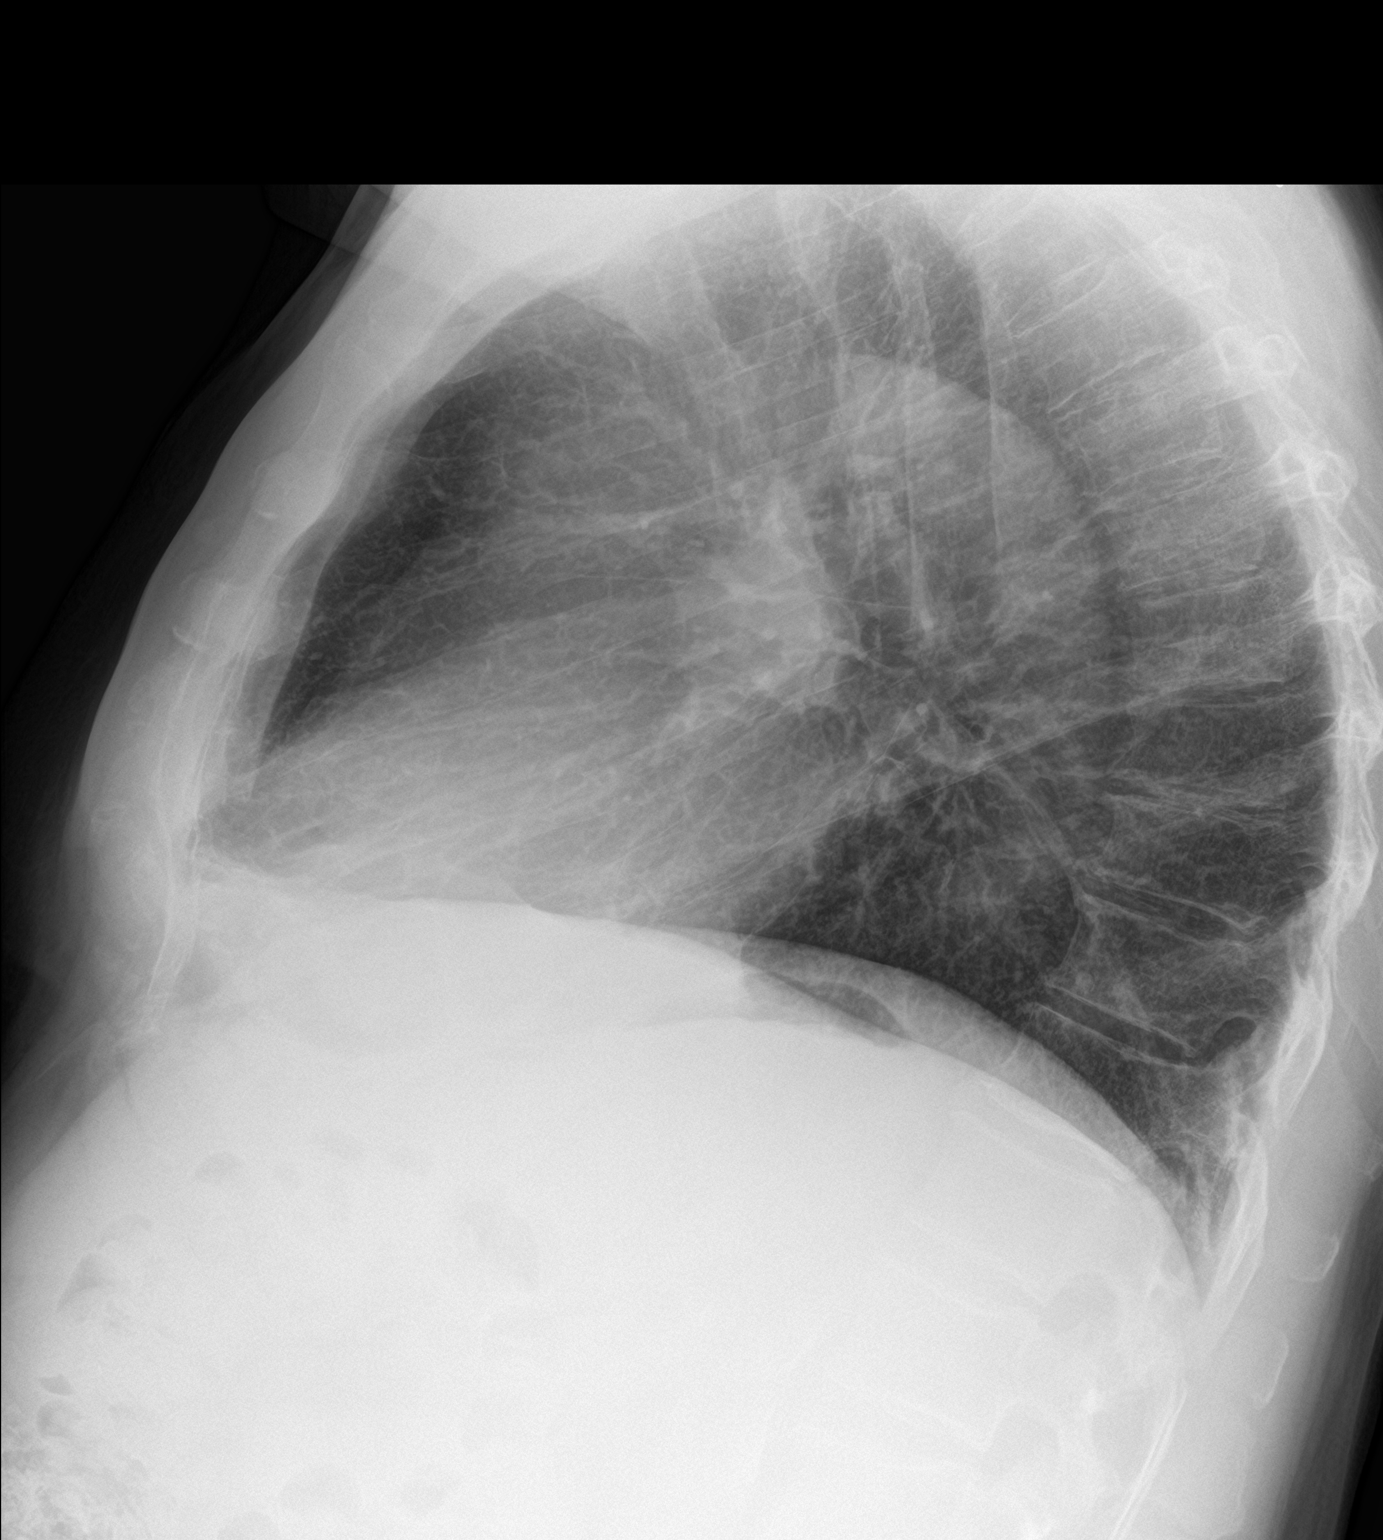

[2 of 2 positions shown; findings below may reference images not displayed]

FINDINGS: The heart size and mediastinal contours are within normal limits.
Both lungs are clear. The visualized skeletal structures are
unremarkable.
IMPRESSION: No active cardiopulmonary disease.

## 2021-06-24 ENCOUNTER — Other Ambulatory Visit: Payer: Self-pay

## 2021-06-24 ENCOUNTER — Ambulatory Visit
Admission: RE | Admit: 2021-06-24 | Discharge: 2021-06-24 | Disposition: A | Discharge status: Home / Self Care | Payer: 59 | Source: Ambulatory Visit | Attending: Nurse Practitioner | Admitting: Nurse Practitioner

## 2021-06-24 VITALS — BP 142/87 | HR 70 | Temp 98.5°F | Resp 18 | Ht 71.0 in | Wt 188.0 lb

## 2021-06-24 DIAGNOSIS — H6091 Unspecified otitis externa, right ear: Secondary | ICD-10-CM

## 2021-06-24 DIAGNOSIS — H6981 Other specified disorders of Eustachian tube, right ear: Secondary | ICD-10-CM

## 2021-06-24 DIAGNOSIS — H6121 Impacted cerumen, right ear: Secondary | ICD-10-CM

## 2021-06-24 MED ORDER — FLUTICASONE PROPIONATE 50 MCG/ACT NA SUSP: 2.0000 | Freq: Every day | NASAL | 0 refills | Status: DC

## 2021-06-24 MED ORDER — PREDNISONE 20 MG PO TABS: 20.0000 mg | ORAL_TABLET | Freq: Every day | ORAL | 0 refills | Status: AC

## 2021-06-24 MED ORDER — OFLOXACIN 0.3 % OT SOLN: 5.0000 [drp] | Freq: Two times a day (BID) | OTIC | 0 refills | Status: AC

## 2021-06-24 NOTE — ED Provider Notes (Signed)
[facility]    CSN: [id number] Arrival date & time: 06/24/21  1654      History   Chief Complaint Chief Complaint  Patient presents with   Ear Fullness    Entered by patient    HPI LIGE LAKEMAN is a 71 y.o. male.   The patient is a 71 year old male who presents with fullness of the right ear.  Patient states symptoms have been present for the past several days, but worsened since this morning.  He states the hearing also sounds "muffled".  He denies fever, chills, cough, nasal congestion, runny nose, or GI symptoms.  Patient states that he tried to use a Q-tip to clean his ear earlier today.  Patient reports history of cerumen impaction, states he normally goes to his primary care physician to have his ears cleaned.  The history is provided by the patient.   Past Medical History:  Diagnosis Date   Headache(784.0)    Hyperlipidemia    Persistent atrial fibrillation ([place])    Seasonal allergies     Patient Active Problem List   Diagnosis Date Noted   Aortic atherosclerosis ([place]) 11/07/2019   Essential hypertension 03/25/2019   Vitamin D deficiency 01/25/2018   BPH associated with nocturia 06/22/2017   Prepatellar bursitis, right knee 06/22/2017   Displaced bimalleolar fracture of right lower leg, subsequent encounter for closed fracture with delayed healing 02/12/2016   Hyperglycemia 09/01/2014   Contact dermatitis 09/01/2014   Insomnia 02/24/2014   Former smoker 02/24/2014   Chest pain 07/18/2012   GERD (gastroesophageal reflux disease) 07/18/2012   Atrial fibrillation ([place]) 36/64/4034   Diastolic dysfunction 74/25/9563   Hyperlipidemia 05/11/2007    Past Surgical History:  Procedure Laterality Date   CARDIOVERSION N/A 05/12/2017   Procedure: CARDIOVERSION;  Surgeon: Fay Records, MD;  Location: [place];  Service: Cardiovascular;  Laterality: N/A;   COLONOSCOPY     LEFT HEART CATHETERIZATION WITH CORONARY ANGIOGRAM N/A 07/19/2012   Procedure: LEFT  HEART CATHETERIZATION WITH CORONARY ANGIOGRAM;  Surgeon: Minus Breeding, MD;  Location: [facility] CATH LAB;  Service: Cardiovascular;  Laterality: N/A;   ORIF ANKLE FRACTURE Right 02/17/2016   Procedure: OPEN REDUCTION INTERNAL FIXATION (ORIF) BIMALLEOLAR ANKLE FRACTURE;  Surgeon: Leandrew Koyanagi, MD;  Location: [place];  Service: Orthopedics;  Laterality: Right;   POLYPECTOMY         Home Medications    Prior to Admission medications   Medication Sig Start Date End Date Taking? Authorizing Provider  apixaban (ELIQUIS) 5 MG TABS tablet TAKE 1 TABLET(5 MG) BY MOUTH TWICE DAILY 04/12/21  Yes Satira Sark, MD  fluticasone [facility]) 50 MCG/ACT nasal spray Place 2 sprays into both nostrils daily. 06/24/21  Yes Amillion Macchia-Warren, Alda Lea, NP  ofloxacin (FLOXIN) 0.3 % OTIC solution Place 5 drops into the right ear 2 (two) times daily for 7 days. 06/24/21 07/01/21 Yes Ezrah Dembeck-Warren, Alda Lea, NP  predniSONE (DELTASONE) 20 MG tablet Take 1 tablet (20 mg total) by mouth daily with breakfast for 5 days. 06/24/21 06/29/21 Yes Younes Degeorge-Warren, Alda Lea, NP  tamsulosin (FLOMAX) 0.4 MG CAPS capsule TAKE 2 CAPSULES(0.8 MG) BY MOUTH DAILY 01/26/21  Yes Marin Olp, MD  Vitamin D, Ergocalciferol, (DRISDOL) 1.25 MG (50000 UNIT) CAPS capsule Take 1 capsule (50,000 Units total) by mouth every 7 (seven) days. 01/18/21   Marin Olp, MD    Family History Family History  Problem Relation Age of Onset   Cancer Mother    Breast cancer  Mother    Heart disease Father        hx A-Fib, 68 with MI   Colon cancer Father 63       metastatic to lung   Rectal cancer Neg Hx    Stomach cancer Neg Hx     Social History Social History   Tobacco Use   Smoking status: Former    Packs/day: 1.00    Years: 10.00    Pack years: 10.00    Types: Cigarettes    Quit date: 10/24/1988    Years since quitting: 32.6   Smokeless tobacco: Never  Vaping Use   Vaping Use: Never used  Substance Use Topics    Alcohol use: No    Alcohol/week: 0.0 standard drinks   Drug use: No     Allergies   Patient has no known allergies.   Review of Systems Review of Systems  Constitutional: Negative.   HENT:         Right cerumen impaction  Eyes: Negative.   Respiratory: Negative.    Cardiovascular: Negative.   Skin: Negative.   Neurological: Negative.   Psychiatric/Behavioral: Negative.      Physical Exam Triage Vital Signs ED Triage Vitals  Enc Vitals Group     BP 06/24/21 1748 (!) 142/87     Pulse Rate 06/24/21 1748 70     Resp 06/24/21 1748 18     Temp 06/24/21 1748 98.5 F (36.9 C)     Temp Source 06/24/21 1748 Oral     SpO2 06/24/21 1748 97 %     Weight 06/24/21 1749 188 lb (85.3 kg)     Height 06/24/21 1749 '5\' 11"'$  (1.803 m)     Head Circumference --      Peak Flow --      Pain Score 06/24/21 1748 0     Pain Loc --      Pain Edu? --      Excl. in [place]? --    No data found.  Updated Vital Signs BP (!) 142/87 (BP Location: Right Arm)   Pulse 70   Temp 98.5 F (36.9 C) (Oral)   Resp 18   Ht '5\' 11"'$  (1.803 m)   Wt 188 lb (85.3 kg)   SpO2 97%   BMI 26.22 kg/m   Visual Acuity Right Eye Distance:   Left Eye Distance:   Bilateral Distance:    Right Eye Near:   Left Eye Near:    Bilateral Near:     Physical Exam Vitals and nursing note reviewed.  Constitutional:      Appearance: Normal appearance.  HENT:     Head: Normocephalic.     Right Ear: Ear canal and external ear normal. There is impacted cerumen.     Left Ear: Tympanic membrane, ear canal and external ear normal. There is no impacted cerumen.     Ears:     Comments: Post right ear irrigation, patient has moderate erythema and inflammation of the right ear canal.  Attempted manual cerumen impaction with a curette, patient was unable to tolerate.  Was able to visualize the tympanic membrane of the right ear, which displays right middle ear effusion.    Nose: Nose normal.     Mouth/Throat:     Mouth: Mucous  membranes are moist.  Eyes:     Extraocular Movements: Extraocular movements intact.     Pupils: Pupils are equal, round, and reactive to light.  Cardiovascular:     Rate and Rhythm:  Normal rate and regular rhythm.     Pulses: Normal pulses.     Heart sounds: Normal heart sounds.  Pulmonary:     Effort: Pulmonary effort is normal.     Breath sounds: Normal breath sounds.  Abdominal:     General: Bowel sounds are normal.     Palpations: Abdomen is soft.  Skin:    General: Skin is warm and dry.     Capillary Refill: Capillary refill takes less than 2 seconds.  Neurological:     General: No focal deficit present.     Mental Status: He is alert and oriented to person, place, and time.  Psychiatric:        Mood and Affect: Mood normal.        Behavior: Behavior normal.     UC Treatments / Results  Labs (all labs ordered are listed, but only abnormal results are displayed) Labs Reviewed - No data to display  EKG   Radiology No results found.  Procedures Procedures (including critical care time)  Medications Ordered in UC Medications - No data to display  Initial Impression / Assessment and Plan / UC Course  I have reviewed the triage vital signs and the nursing notes.  Pertinent labs & imaging results that were available during my care of the patient were reviewed by me and considered in my medical decision making (see chart for details).  The patient is a 71 year old male who presents with right cerumen impaction of the right ear.  Patient has a history of cerumen impaction.  Ear irrigation of the right ear was performed, and was able to visualize the tympanic membrane.  Patient did have remaining cerumen in the right ear canal, attempted to remove with curette but patient was unable to tolerate.  Patient's right ear canal was inflamed and irritated, along with a middle ear effusion present.  Patient was prescribed prednisone and fluticasone to help with his middle ear  effusion, along with antibiotic eardrops, ofloxacin, for his ear canal.  Patient was advised to perform supportive care while his symptoms persist to include warm compresses, avoid sticking anything inside of the ear.  Recommend ibuprofen or Tylenol for pain or discomfort.  Patient advised to follow-up if symptoms worsen or do not improve. Final Clinical Impressions(s) / UC Diagnoses   Final diagnoses:  Acute dysfunction of right eustachian tube  Inflammation of right ear canal  Impacted cerumen of right ear     Discharge Instructions      Take medication as prescribed. Warm compresses to the right ear to help with pain or discomfort. Do not stick anything inside of the right ear while symptoms persist. May take ibuprofen or Tylenol for any pain, fever, or general discomfort. Follow up if symptoms worsen or do not improve.     ED Prescriptions     Medication Sig Dispense Auth. Provider   ofloxacin (FLOXIN) 0.3 % OTIC solution Place 5 drops into the right ear 2 (two) times daily for 7 days. 5 mL Cloyce Blankenhorn-Warren, Alda Lea, NP   fluticasone (FLONASE) 50 MCG/ACT nasal spray Place 2 sprays into both nostrils daily. 16 g Joselynne Killam-Warren, Alda Lea, NP   predniSONE (DELTASONE) 20 MG tablet Take 1 tablet (20 mg total) by mouth daily with breakfast for 5 days. 5 tablet Kingjames Coury-Warren, Alda Lea, NP      PDMP not reviewed this encounter.   Tish Men, NP 06/24/21 2016

## 2021-06-24 NOTE — Discharge Instructions (Addendum)
Take medication as prescribed. Warm compresses to the right ear to help with pain or discomfort. Do not stick anything inside of the right ear while symptoms persist. May take ibuprofen or Tylenol for any pain, fever, or general discomfort. Follow up if symptoms worsen or do not improve.

## 2021-06-24 NOTE — ED Triage Notes (Signed)
Pt reports right ear fullness since this am. Pt reports history of similar.

## 2021-07-19 ENCOUNTER — Encounter: Payer: Self-pay | Admitting: Family Medicine

## 2021-07-20 ENCOUNTER — Other Ambulatory Visit: Payer: Self-pay | Admitting: Family Medicine

## 2021-09-14 ENCOUNTER — Encounter: Payer: Self-pay | Admitting: Gastroenterology

## 2021-10-19 ENCOUNTER — Ambulatory Visit: Payer: Medicare HMO | Admitting: Gastroenterology

## 2021-10-19 VITALS — BP 124/72 | HR 70 | Ht 70.0 in | Wt 200.0 lb

## 2021-10-19 DIAGNOSIS — R1032 Left lower quadrant pain: Secondary | ICD-10-CM

## 2021-10-19 DIAGNOSIS — Z86010 Personal history of colonic polyps: Secondary | ICD-10-CM | POA: Diagnosis not present

## 2021-10-19 MED ORDER — NA SULFATE-K SULFATE-MG SULF 17.5-3.13-1.6 GM/177ML PO SOLN: 1.0000 | Freq: Once | ORAL | 0 refills | Status: AC

## 2021-10-19 NOTE — Progress Notes (Signed)
HPI : Robert David is a very pleasant 71 year old male with a history of A-fib on Eliquis who I initially saw in February 2023 for persistent left lower quadrant pain.  He had had a CT scan in September 2021 which was unremarkable.  Today he reports ongoing issues with this pain.  It continues to come and go, with his last episode about 2 weeks ago.  Still no significant changes in his bowel habits.  His stool consistency varies.  He denies seeing any blood in his stool.  His weight is stable. His last colonoscopy was in 2016 and was unremarkable.  His colonoscopy before that was notable for a tubular adenoma.  Past Medical History:  Diagnosis Date   Headache(784.0)    Hyperlipidemia    Persistent atrial fibrillation ([place])    Seasonal allergies      Past Surgical History:  Procedure Laterality Date   CARDIOVERSION N/A 05/12/2017   Procedure: CARDIOVERSION;  Surgeon: Fay Records, MD;  Location: [place];  Service: Cardiovascular;  Laterality: N/A;   COLONOSCOPY     LEFT HEART CATHETERIZATION WITH CORONARY ANGIOGRAM N/A 07/19/2012   Procedure: LEFT HEART CATHETERIZATION WITH CORONARY ANGIOGRAM;  Surgeon: Minus Breeding, MD;  Location: [facility] CATH LAB;  Service: Cardiovascular;  Laterality: N/A;   ORIF ANKLE FRACTURE Right 02/17/2016   Procedure: OPEN REDUCTION INTERNAL FIXATION (ORIF) BIMALLEOLAR ANKLE FRACTURE;  Surgeon: Leandrew Koyanagi, MD;  Location: [facility];  Service: Orthopedics;  Laterality: Right;   POLYPECTOMY     Family History  Problem Relation Age of Onset   Cancer Mother    Breast cancer Mother    Heart disease Father        hx A-Fib, 67 with MI   Colon cancer Father 59       metastatic to lung   Rectal cancer Neg Hx    Stomach cancer Neg Hx    Social History   Tobacco Use   Smoking status: Former    Packs/day: 1.00    Years: 10.00    Total pack years: 10.00    Types: Cigarettes    Quit date: 10/24/1988    Years since quitting: 33.0   Smokeless  tobacco: Never  Vaping Use   Vaping Use: Never used  Substance Use Topics   Alcohol use: No    Alcohol/week: 0.0 standard drinks of alcohol   Drug use: No   Current Outpatient Medications  Medication Sig Dispense Refill   apixaban (ELIQUIS) 5 MG TABS tablet TAKE 1 TABLET(5 MG) BY MOUTH TWICE DAILY 60 tablet 5   NON FORMULARY Cognium complete daily - Brain function     tamsulosin (FLOMAX) 0.4 MG CAPS capsule TAKE 2 CAPSULES(0.8 MG) BY MOUTH DAILY 60 capsule 5   No current facility-administered medications for this visit.   No Known Allergies   Review of Systems: All systems reviewed and negative except where noted in HPI.    No results found.  Physical Exam: BP 124/72   Pulse 70   Ht '5\' 10"'$  (1.778 m)   Wt 200 lb (90.7 kg)   SpO2 98%   BMI 28.70 kg/m  Constitutional: Pleasant,well-developed, Caucasian male in no acute distress. HEENT: Normocephalic and atraumatic. Conjunctivae are normal. No scleral icterus. Cardiovascular: Normal rate, regular rhythm.  Pulmonary/chest: Effort normal and breath sounds normal. No wheezing, rales or rhonchi. Abdominal: Soft, nondistended, nontender. Bowel sounds active throughout. There are no masses palpable. No hepatomegaly. Extremities: no edema Neurological: Alert and oriented to person  place and time. Skin: Skin is warm and dry. No rashes noted. Psychiatric: Normal mood and affect. Behavior is normal.  CBC    Component Value Date/Time   WBC 4.8 01/18/2021 0922   RBC 4.97 01/18/2021 0922   HGB 14.5 01/18/2021 0922   HCT 44.4 01/18/2021 0922   PLT 215.0 01/18/2021 0922   MCV 89.4 01/18/2021 0922   MCH 28.7 01/14/2020 0915   MCHC 32.8 01/18/2021 0922   RDW 13.5 01/18/2021 0922   LYMPHSABS 1.5 01/18/2021 0922   MONOABS 0.4 01/18/2021 0922   EOSABS 0.2 01/18/2021 0922   BASOSABS 0.0 01/18/2021 0922    CMP     Component Value Date/Time   NA 144 01/18/2021 0922   K 5.0 01/18/2021 0922   CL 107 01/18/2021 0922   CO2 28  01/18/2021 0922   GLUCOSE 96 01/18/2021 0922   BUN 15 01/18/2021 0922   CREATININE 1.07 01/18/2021 0922   CREATININE 1.28 (H) 01/14/2020 0915   CALCIUM 9.8 01/18/2021 0922   PROT 6.7 01/18/2021 0922   ALBUMIN 4.5 01/18/2021 0922   AST 17 01/18/2021 0922   ALT 18 01/18/2021 0922   ALKPHOS 51 01/18/2021 0922   BILITOT 0.6 01/18/2021 0922   GFRNONAA 56 (L) 01/14/2020 0915   GFRAA 65 01/14/2020 0915     ASSESSMENT AND PLAN: 71 year old male with a history of tubular adenoma and 2009, no polyps in 2016 with persistent bothersome left lower quadrant pain.  Although a colonoscopy is likely not to be diagnostic for his abdominal pain, the patient does have a history of precancerous polyps, and guidelines would recommend surveillance colonoscopy in 7 to 10 years.  I think would be reasonable to proceed with 1 last surveillance colonoscopy and reassess for presence of polyps.  We can also exclude potential causes of abdominal pain at that time as well, although likelihood of finding a culpable lesion is very low.  Personal history of polyps - Surveillance colonoscopy -  Hold Eliquis x 1 day  Abdominal pain, likely functional - Colonoscopy will exclude intraluminal causes - Trial of IBgard as needed for pain  The details, risks (including bleeding, perforation, infection, missed lesions, medication reactions and possible hospitalization or surgery if complications occur), benefits, and alternatives to colonoscopy with possible biopsy and possible polypectomy were discussed with the patient and he consents to proceed.   Drue Camera E. Candis Schatz, MD [facility]    CC:  Marin Olp, MD

## 2021-10-19 NOTE — Patient Instructions (Signed)
_______________________________________________________  If you are age 73 or older, your body mass index should be between 23-30. Your Body mass index is 28.7 kg/m. If this is out of the aforementioned range listed, please consider follow up with your Primary Care Provider.  If you are age 76 or younger, your body mass index should be between 19-25. Your Body mass index is 28.7 kg/m. If this is out of the aformentioned range listed, please consider follow up with your Primary Care Provider.   You have been scheduled for a colonoscopy. Please follow written instructions given to you at your visit today.  Please pick up your prep supplies at the pharmacy within the next 1-3 days. If you use inhalers (even only as needed), please bring them with you on the day of your procedure.  The [facility] providers would like to encourage you to use [facility] to communicate with providers for non-urgent requests or questions.  Due to long hold times on the telephone, sending your provider a message by [facility] may be a faster and more efficient way to get a response.  Please allow 48 business hours for a response.  Please remember that this is for non-urgent requests.   It was a pleasure to see you today!  Thank you for trusting me with your gastrointestinal care!    Scott E.Candis Schatz, MD

## 2021-10-29 ENCOUNTER — Encounter: Payer: Self-pay | Admitting: Gastroenterology

## 2021-10-29 DIAGNOSIS — Z0100 Encounter for examination of eyes and vision without abnormal findings: Secondary | ICD-10-CM | POA: Diagnosis not present

## 2021-10-29 DIAGNOSIS — H524 Presbyopia: Secondary | ICD-10-CM | POA: Diagnosis not present

## 2021-11-01 ENCOUNTER — Encounter: Payer: Self-pay | Admitting: *Deleted

## 2021-11-01 ENCOUNTER — Encounter: Payer: Medicare HMO | Admitting: Gastroenterology

## 2021-11-02 ENCOUNTER — Telehealth: Payer: Self-pay

## 2021-11-02 ENCOUNTER — Encounter: Payer: Self-pay | Admitting: Cardiology

## 2021-11-02 ENCOUNTER — Telehealth: Payer: Self-pay | Admitting: Gastroenterology

## 2021-11-02 NOTE — Telephone Encounter (Signed)
Per pharmacy team may hold Eliquis 1 day prior to planned procedure. Will route to GI so they are aware.   Loel Dubonnet, NP

## 2021-11-02 NOTE — Telephone Encounter (Signed)
[facility] Pre-operative Risk Assessment     Request for surgical clearance:     Endoscopy Procedure  What type of surgery is being performed?     Colonoscopy  When is this surgery scheduled?     11/05/21  What type of clearance is required ?   Pharmacy  Are there any medications that need to be held prior to surgery and how long? Eliquis 1 day   Practice name and name of physician performing surgery?      [facility]  What is your office phone and fax number?      Phone- [phone]  [phone]  Anesthesia type (None, local, MAC, general) ?       MAC

## 2021-11-02 NOTE — Telephone Encounter (Signed)
Left VM for patient to return call regarding Eliquis clearance.

## 2021-11-02 NOTE — Telephone Encounter (Signed)
Patient called states he is scheduled for a procedure this Friday however no one has advised him on when to stp his Eliquis.

## 2021-11-02 NOTE — Telephone Encounter (Signed)
Patient with diagnosis of afib on Eliquis for anticoagulation.    Procedure: Colonoscopy  Date of procedure: 11/05/21   CHA2DS2-VASc Score = 3   This indicates a 3.2% annual risk of stroke. The patient's score is based upon: CHF History: 1 HTN History: 1 Diabetes History: 0 Stroke History: 0 Vascular Disease History: 0 Age Score: 1 Gender Score: 0      CrCl 71 ml/min  Per office protocol, patient can hold Eliquis for 1 day prior to procedure.    **This guidance is not considered finalized until pre-operative APP has relayed final recommendations.**

## 2021-11-03 NOTE — Telephone Encounter (Signed)
Spoke with patient and he stated he received VM on holding eliquis one day prior to his procedure.

## 2021-11-05 ENCOUNTER — Encounter: Payer: Self-pay | Admitting: Gastroenterology

## 2021-11-05 ENCOUNTER — Ambulatory Visit (AMBULATORY_SURGERY_CENTER): Payer: Medicare HMO | Admitting: Gastroenterology

## 2021-11-05 VITALS — BP 120/65 | HR 67 | Temp 97.8°F | Resp 18 | Ht 70.0 in | Wt 200.0 lb

## 2021-11-05 DIAGNOSIS — D123 Benign neoplasm of transverse colon: Secondary | ICD-10-CM | POA: Diagnosis not present

## 2021-11-05 DIAGNOSIS — Z86010 Personal history of colonic polyps: Secondary | ICD-10-CM

## 2021-11-05 DIAGNOSIS — R109 Unspecified abdominal pain: Secondary | ICD-10-CM

## 2021-11-05 DIAGNOSIS — K5730 Diverticulosis of large intestine without perforation or abscess without bleeding: Secondary | ICD-10-CM

## 2021-11-05 DIAGNOSIS — D122 Benign neoplasm of ascending colon: Secondary | ICD-10-CM | POA: Diagnosis not present

## 2021-11-05 MED ORDER — SODIUM CHLORIDE 0.9 % IV SOLN: 500.0000 mL | Freq: Once | INTRAVENOUS | Status: DC

## 2021-11-05 NOTE — Progress Notes (Signed)
PT taken to PACU. Monitors in place. VSS. Report given to RN. 

## 2021-11-05 NOTE — Op Note (Signed)
[facility] Patient Name: Robert David Procedure Date: 11/05/2021 2:04 PM MRN: [id number] Endoscopist: Nicki Reaper E. Candis Schatz , MD Age: 71 Referring MD:  Date of Birth: Jan 03, 1951 Gender: Male Account #: [id number] Procedure:                Colonoscopy Indications:              High risk colon cancer surveillance: Personal                            history of adenoma less than 10 mm in size,                            Incidental abdominal pain noted Medicines:                Monitored Anesthesia Care Procedure:                Pre-Anesthesia Assessment:                           - Prior to the procedure, a History and Physical                            was performed, and patient medications and                            allergies were reviewed. The patient's tolerance of                            previous anesthesia was also reviewed. The risks                            and benefits of the procedure and the sedation                            options and risks were discussed with the patient.                            All questions were answered, and informed consent                            was obtained. Prior Anticoagulants: The patient has                            taken Eliquis (apixaban), last dose was 1 day prior                            to procedure. ASA Grade Assessment: III - A patient                            with severe systemic disease. After reviewing the                            risks and benefits, the patient was deemed in  satisfactory condition to undergo the procedure.                           After obtaining informed consent, the colonoscope                            was passed under direct vision. Throughout the                            procedure, the patient's blood pressure, pulse, and                            oxygen saturations were monitored continuously. The                            CF [id number] #[id number] was  introduced through the anus                            and advanced to the the terminal ileum, with                            identification of the appendiceal orifice and IC                            valve. The colonoscopy was performed without                            difficulty. The patient tolerated the procedure                            well. The quality of the bowel preparation was                            adequate. The terminal ileum, ileocecal valve,                            appendiceal orifice, and rectum were photographed.                            The bowel preparation used was SUPREP via split                            dose instruction. Scope In: 2:15:00 PM Scope Out: 2:35:29 PM Scope Withdrawal Time: 0 hours 14 minutes 31 seconds  Total Procedure Duration: 0 hours 20 minutes 29 seconds  Findings:                 The perianal and digital rectal examinations were                            normal. Pertinent negatives include normal                            sphincter tone and no palpable rectal lesions.  Three sessile polyps were found in the hepatic                            flexure and ascending colon. The polyps were 4 to 6                            mm in size. These polyps were removed with a cold                            snare. Resection and retrieval were complete.                            Estimated blood loss was minimal.                           Multiple small and large-mouthed diverticula were                            found in the sigmoid colon and descending colon.                            There was narrowing of the colon in association                            with the diverticular opening.                           The exam was otherwise normal throughout the                            examined colon.                           The terminal ileum appeared normal.                           The retroflexed view of the  distal rectum and anal                            verge was normal and showed no anal or rectal                            abnormalities. Complications:            No immediate complications. Estimated Blood Loss:     Estimated blood loss was minimal. Impression:               - Three 4 to 6 mm polyps at the hepatic flexure and                            in the ascending colon, removed with a cold snare.                            Resected and retrieved.                           -  Severe diverticulosis in the sigmoid colon and in                            the descending colon. There was narrowing of the                            colon in association with the diverticular opening.                           - The examined portion of the ileum was normal.                           - The distal rectum and anal verge are normal on                            retroflexion view.                           - No abnormalities to explain abdominal pain. Recommendation:           - Patient has a contact number available for                            emergencies. The signs and symptoms of potential                            delayed complications were discussed with the                            patient. Return to normal activities tomorrow.                            Written discharge instructions were provided to the                            patient.                           - Resume previous diet.                           - Continue present medications.                           - Await pathology results.                           - Given age and lack of high risk polyps, would                            advise against further screening/surveillance                            colonoscopy. Emelyn Roen E. Candis Schatz, MD 11/05/2021 2:44:56 PM This report has been signed electronically.

## 2021-11-05 NOTE — Progress Notes (Signed)
Called to room to assist during endoscopic procedure.  Patient ID and intended procedure confirmed with present staff. Received instructions for my participation in the procedure from the performing physician.  

## 2021-11-05 NOTE — Progress Notes (Signed)
VS completed by DT.  Pt's states no medical or surgical changes since previsit or office visit.  

## 2021-11-05 NOTE — Patient Instructions (Signed)
Resume previous diet and medications. Awaiting pathology results. Would advise against further screening due to age.  YOU HAD AN ENDOSCOPIC PROCEDURE TODAY AT [facility]:   Refer to the procedure report that was given to you for any specific questions about what was found during the examination.  If the procedure report does not answer your questions, please call your gastroenterologist to clarify.  If you requested that your care partner not be given the details of your procedure findings, then the procedure report has been included in a sealed envelope for you to review at your convenience later.  YOU SHOULD EXPECT: Some feelings of bloating in the abdomen. Passage of more gas than usual.  Walking can help get rid of the air that was put into your GI tract during the procedure and reduce the bloating. If you had a lower endoscopy (such as a colonoscopy or flexible sigmoidoscopy) you may notice spotting of blood in your stool or on the toilet paper. If you underwent a bowel prep for your procedure, you may not have a normal bowel movement for a few days.  Please Note:  You might notice some irritation and congestion in your nose or some drainage.  This is from the oxygen used during your procedure.  There is no need for concern and it should clear up in a day or so.  SYMPTOMS TO REPORT IMMEDIATELY:  Following lower endoscopy (colonoscopy or flexible sigmoidoscopy):  Excessive amounts of blood in the stool  Significant tenderness or worsening of abdominal pains  Swelling of the abdomen that is new, acute  Fever of 100F or higher  For urgent or emergent issues, a gastroenterologist can be reached at any hour by calling [phone]. Do not use MyChart messaging for urgent concerns.    DIET:  We do recommend a small meal at first, but then you may proceed to your regular diet.  Drink plenty of fluids but you should avoid alcoholic beverages for 24 hours.  ACTIVITY:  You  should plan to take it easy for the rest of today and you should NOT DRIVE or use heavy machinery until tomorrow (because of the sedation medicines used during the test).    FOLLOW UP: Our staff will call the number listed on your records the next business day following your procedure.  We will call around 7:15- 8:00 am to check on you and address any questions or concerns that you may have regarding the information given to you following your procedure. If we do not reach you, we will leave a message.     If any biopsies were taken you will be contacted by phone or by letter within the next 1-3 weeks.  Please call us at [phone] if you have not heard about the biopsies in 3 weeks.    SIGNATURES/CONFIDENTIALITY: You and/or your care partner have signed paperwork which will be entered into your electronic medical record.  These signatures attest to the fact that that the information above on your After Visit Summary has been reviewed and is understood.  Full responsibility of the confidentiality of this discharge information lies with you and/or your care-partner.

## 2021-11-05 NOTE — Progress Notes (Signed)
History and Physical Interval Note:  11/05/2021 2:04 PM  Robert David  has presented today for endoscopic procedure(s), with the diagnosis of  Encounter Diagnosis  Name Primary?   History of colon polyps Yes  .  The various methods of evaluation and treatment have been discussed with the patient and/or family. After consideration of risks, benefits and other options for treatment, the patient has consented to  the endoscopic procedure(s).   The patient's history has been reviewed, patient examined, no change in status, stable for endoscopic procedure(s).  I have reviewed the patient's chart and labs.  Questions were answered to the patient's satisfaction.     Alwin Lanigan E. Candis Schatz, MD [facility]

## 2021-11-08 ENCOUNTER — Telehealth: Payer: Self-pay

## 2021-11-08 NOTE — Telephone Encounter (Signed)
  Follow up Call-     11/05/2021    1:10 PM  Call back number  Post procedure Call Back phone  # [phone]  Permission to leave phone message Yes     Patient questions:  Do you have a fever, pain , or abdominal swelling? No. Pain Score  0 *  Have you tolerated food without any problems? Yes.    Have you been able to return to your normal activities? Yes.    Do you have any questions about your discharge instructions: Diet   No. Medications  No. Follow up visit  No.  Do you have questions or concerns about your Care? No.  Actions: * If pain score is 4 or above: No action needed, pain <4.

## 2021-11-11 NOTE — Progress Notes (Signed)
Mr. Robert David,  The 3 polyps that were removed during your colonoscopy were common precancerous polyps without any high risk features.  Current guidelines suggest repeating a colonoscopy in 5 years when 3 precancerous polyps have been removed, as in your case.  However, performing colonoscopy past the age 71 is not routinely recommended, and should be taken on a case-by-case basis.    Given your history of small and low risk polyps, I think your risk of colon cancer is very low and would recommend against further colon cancer screening.    If, in 5 years you are still in very good health with a life expectancy of 10 years, it may be reasonable to perform one more colonoscopy.

## 2021-11-16 NOTE — Progress Notes (Unsigned)
Cardiology Office Note  Date: 11/17/2021   ID: [name], DOB 05/10/1950, MRN [id number]  PCP:  Marin Olp, MD  Cardiologist:  Rozann Lesches, MD Electrophysiologist:  None   Chief Complaint  Patient presents with   Cardiac follow-up    History of Present Illness: Robert David is a 71 y.o. male last seen in October 2022.  He is here for a routine visit.  Reports no significant palpitations, has not required any AV nodal blockers for heart rate control and reports no major functional limitations.  He exercises at the [employer] twice a week.  No exertional chest pain.  We went over his medications, continues to tolerate Eliquis with no spontaneous bleeding problems.  He will have lab work with PCP in December.  I personally reviewed his ECG today which shows atrial fibrillation at 60 bpm.  Past Medical History:  Diagnosis Date   Headache(784.0)    Hyperlipidemia    Persistent atrial fibrillation ([place])    Seasonal allergies     Past Surgical History:  Procedure Laterality Date   CARDIOVERSION N/A 05/12/2017   Procedure: CARDIOVERSION;  Surgeon: Fay Records, MD;  Location: [facility];  Service: Cardiovascular;  Laterality: N/A;   COLONOSCOPY     LEFT HEART CATHETERIZATION WITH CORONARY ANGIOGRAM N/A 07/19/2012   Procedure: LEFT HEART CATHETERIZATION WITH CORONARY ANGIOGRAM;  Surgeon: Minus Breeding, MD;  Location: [facility] CATH LAB;  Service: Cardiovascular;  Laterality: N/A;   ORIF ANKLE FRACTURE Right 02/17/2016   Procedure: OPEN REDUCTION INTERNAL FIXATION (ORIF) BIMALLEOLAR ANKLE FRACTURE;  Surgeon: Leandrew Koyanagi, MD;  Location: [facility];  Service: Orthopedics;  Laterality: Right;   POLYPECTOMY      Current Outpatient Medications  Medication Sig Dispense Refill   apixaban (ELIQUIS) 5 MG TABS tablet TAKE 1 TABLET(5 MG) BY MOUTH TWICE DAILY 60 tablet 5   NON FORMULARY Cognium complete daily - Brain function     tamsulosin (FLOMAX) 0.4 MG CAPS capsule  TAKE 2 CAPSULES(0.8 MG) BY MOUTH DAILY 60 capsule 5   No current facility-administered medications for this visit.   Allergies:  Patient has no known allergies.   ROS: No orthopnea or PND.  Physical Exam: VS:  BP 130/84   Pulse 66   Ht '5\' 11"'$  (1.803 m)   Wt 193 lb 6.4 oz (87.7 kg)   SpO2 98%   BMI 26.97 kg/m , BMI Body mass index is 26.97 kg/m.  Wt Readings from Last 3 Encounters:  11/17/21 193 lb 6.4 oz (87.7 kg)  11/05/21 200 lb (90.7 kg)  10/19/21 200 lb (90.7 kg)    General: Patient appears comfortable at rest. HEENT: Conjunctiva and lids normal. Neck: Supple, no elevated JVP or carotid bruits. Lungs: Clear to auscultation, nonlabored breathing at rest. Cardiac: Irregularly irregular, no S3 or significant systolic murmur. Extremities: No pitting edema.  ECG:  No syncope.  Recent Labwork: 01/18/2021: ALT 18; AST 17; BUN 15; Creatinine, Ser 1.07; Hemoglobin 14.5; Platelets 215.0; Potassium 5.0; Sodium 144     Component Value Date/Time   CHOL 175 01/18/2021 0922   TRIG 55.0 01/18/2021 0922   HDL 49.50 01/18/2021 0922   CHOLHDL 4 01/18/2021 0922   VLDL 11.0 01/18/2021 0922   LDLCALC 114 (H) 01/18/2021 0922   LDLDIRECT 117 (H) 01/14/2020 0915    Other Studies Reviewed Today:  Echocardiogram 07/01/2020:  1. Left ventricular ejection fraction, by estimation, is 55 to 60%. The  left ventricle has normal function. The left  ventricle has no regional  wall motion abnormalities. Left ventricular diastolic parameters are  indeterminate. The average left  ventricular global longitudinal strain is -17.9 %. The global longitudinal  strain is normal.   2. Right ventricular systolic function is normal. The right ventricular  size is normal.   3. Left atrial size was severely dilated.   4. The mitral valve is normal in structure. No evidence of mitral valve  regurgitation. No evidence of mitral stenosis.   5. The aortic valve is calcified. There is mild calcification of the   aortic valve. There is mild thickening of the aortic valve. Aortic valve  regurgitation is not visualized. No aortic stenosis is present.   Assessment and Plan:  1.  Permanent atrial fibrillation with CHA2DS2-VASc score of 2.  Permanent atrial fibrillation with CHA2DS2-VASc score of 2.  He is doing well without active symptoms or major functional limitation.  Not requiring AV nodal blockers for heart rate control.  I reviewed his ECG today.  He continues on Eliquis for stroke prophylaxis and will have lab work with PCP in December.  Reports no spontaneous bleeding problems.  Left atrium is severely dilated suggesting rhythm control would be much less effective, we will continue with current course.  Echocardiogram from last year showed no significant mitral regurgitation either.  2.  Essential hypertension, blood pressure control is adequate today in the setting of diet and exercise.  Keep follow-up with PCP.  Medication Adjustments/Labs and Tests Ordered: Current medicines are reviewed at length with the patient today.  Concerns regarding medicines are outlined above.   Tests Ordered: Orders Placed This Encounter  Procedures   EKG 12-Lead    Medication Changes: No orders of the defined types were placed in this encounter.   Disposition:  Follow up  1 year, sooner if needed.  Signed, Satira Sark, MD, [facility] 11/17/2021 9:13 AM    [facility] [facility] [place], [place], [place] [address] Phone: [phone]; Fax: [phone]

## 2021-11-17 ENCOUNTER — Encounter: Payer: Self-pay | Admitting: Cardiology

## 2021-11-17 ENCOUNTER — Ambulatory Visit: Payer: Medicare HMO | Attending: Cardiology | Admitting: Cardiology

## 2021-11-17 VITALS — BP 130/84 | HR 66 | Ht 71.0 in | Wt 193.4 lb

## 2021-11-17 DIAGNOSIS — I4821 Permanent atrial fibrillation: Secondary | ICD-10-CM

## 2021-11-17 DIAGNOSIS — I10 Essential (primary) hypertension: Secondary | ICD-10-CM | POA: Diagnosis not present

## 2021-11-17 NOTE — Patient Instructions (Signed)
Medication Instructions:  Your physician recommends that you continue on your current medications as directed. Please refer to the Current Medication list given to you today.   Labwork: none  Testing/Procedures: none  Follow-Up:  Your physician recommends that you schedule a follow-up appointment in: 1 year   Any Other Special Instructions Will Be Listed Below (If Applicable).  You will receive a call in about 10 months reminding you to schedule your appointment. If you do not receive this call, please contact our office.  If you need a refill on your cardiac medications before your next appointment, please call your pharmacy.  

## 2021-11-22 DIAGNOSIS — H40013 Open angle with borderline findings, low risk, bilateral: Secondary | ICD-10-CM | POA: Diagnosis not present

## 2022-01-19 ENCOUNTER — Ambulatory Visit (INDEPENDENT_AMBULATORY_CARE_PROVIDER_SITE_OTHER): Payer: Medicare HMO | Admitting: Family Medicine

## 2022-01-19 ENCOUNTER — Encounter: Payer: Self-pay | Admitting: Family Medicine

## 2022-01-19 VITALS — BP 120/72 | HR 89 | Temp 98.2°F | Ht 71.0 in | Wt 192.2 lb

## 2022-01-19 DIAGNOSIS — D6869 Other thrombophilia: Secondary | ICD-10-CM

## 2022-01-19 DIAGNOSIS — I700 Atherosclerosis of aorta: Secondary | ICD-10-CM | POA: Diagnosis not present

## 2022-01-19 DIAGNOSIS — N401 Enlarged prostate with lower urinary tract symptoms: Secondary | ICD-10-CM | POA: Diagnosis not present

## 2022-01-19 DIAGNOSIS — R351 Nocturia: Secondary | ICD-10-CM | POA: Diagnosis not present

## 2022-01-19 DIAGNOSIS — E559 Vitamin D deficiency, unspecified: Secondary | ICD-10-CM | POA: Diagnosis not present

## 2022-01-19 DIAGNOSIS — Z0000 Encounter for general adult medical examination without abnormal findings: Secondary | ICD-10-CM | POA: Diagnosis not present

## 2022-01-19 DIAGNOSIS — E785 Hyperlipidemia, unspecified: Secondary | ICD-10-CM

## 2022-01-19 DIAGNOSIS — R739 Hyperglycemia, unspecified: Secondary | ICD-10-CM | POA: Diagnosis not present

## 2022-01-19 LAB — CBC WITH DIFFERENTIAL/PLATELET
Basophils Absolute: 0 10*3/uL (ref 0.0–0.1)
Basophils Relative: 0.6 % (ref 0.0–3.0)
Eosinophils Absolute: 0.2 10*3/uL (ref 0.0–0.7)
Eosinophils Relative: 3.1 % (ref 0.0–5.0)
HCT: 45.2 % (ref 39.0–52.0)
Hemoglobin: 15 g/dL (ref 13.0–17.0)
Lymphocytes Relative: 29.2 % (ref 12.0–46.0)
Lymphs Abs: 1.6 10*3/uL (ref 0.7–4.0)
MCHC: 33.2 g/dL (ref 30.0–36.0)
MCV: 88 fl (ref 78.0–100.0)
Monocytes Absolute: 0.5 10*3/uL (ref 0.1–1.0)
Monocytes Relative: 8.6 % (ref 3.0–12.0)
Neutro Abs: 3.1 10*3/uL (ref 1.4–7.7)
Neutrophils Relative %: 58.5 % (ref 43.0–77.0)
Platelets: 225 10*3/uL (ref 150.0–400.0)
RBC: 5.14 Mil/uL (ref 4.22–5.81)
RDW: 13.2 % (ref 11.5–15.5)
WBC: 5.4 10*3/uL (ref 4.0–10.5)

## 2022-01-19 LAB — COMPREHENSIVE METABOLIC PANEL
ALT: 23 U/L (ref 0–53)
AST: 21 U/L (ref 0–37)
Albumin: 4.7 g/dL (ref 3.5–5.2)
Alkaline Phosphatase: 44 U/L (ref 39–117)
BUN: 19 mg/dL (ref 6–23)
CO2: 28 mEq/L (ref 19–32)
Calcium: 9.8 mg/dL (ref 8.4–10.5)
Chloride: 104 mEq/L (ref 96–112)
Creatinine, Ser: 0.97 mg/dL (ref 0.40–1.50)
GFR: 77.67 mL/min (ref >60.00)
Glucose, Bld: 92 mg/dL (ref 70–99)
Potassium: 3.9 mEq/L (ref 3.5–5.1)
Sodium: 141 mEq/L (ref 135–145)
Total Bilirubin: 0.7 mg/dL (ref 0.2–1.2)
Total Protein: 6.8 g/dL (ref 6.0–8.3)

## 2022-01-19 LAB — LIPID PANEL
Cholesterol: 173 mg/dL (ref 0–200)
HDL: 46.5 mg/dL (ref >39.00)
LDL Cholesterol: 109 mg/dL — ABNORMAL HIGH (ref 0–99)
NonHDL: 126.4
Total CHOL/HDL Ratio: 4
Triglycerides: 89 mg/dL (ref 0.0–149.0)
VLDL: 17.8 mg/dL (ref 0.0–40.0)

## 2022-01-19 LAB — PSA: PSA: 1.39 ng/mL (ref 0.10–4.00)

## 2022-01-19 LAB — VITAMIN D 25 HYDROXY (VIT D DEFICIENCY, FRACTURES): VITD: 24.88 ng/mL — ABNORMAL LOW (ref 30.00–100.00)

## 2022-01-19 LAB — HEMOGLOBIN A1C: Hgb A1c MFr Bld: 5.9 % (ref 4.6–6.5)

## 2022-01-19 NOTE — Progress Notes (Signed)
Phone: [phone]   Subjective:  Patient presents today for their annual physical. Chief complaint-noted.   See problem oriented charting- ROS- full  review of systems was completed and negative  except for: hand pain without prolonged morning stiffness  The following were reviewed and entered/updated in epic: Past Medical History:  Diagnosis Date   Headache(784.0)    Hyperlipidemia    Persistent atrial fibrillation (HCC)    Seasonal allergies    Patient Active Problem List   Diagnosis Date Noted   Atrial fibrillation ([place]) 05/26/2009    Priority: High   Aortic atherosclerosis ([place]) 11/07/2019    Priority: Medium    Essential hypertension 03/25/2019    Priority: Medium    Vitamin D deficiency 01/25/2018    Priority: Medium    BPH associated with nocturia 06/22/2017    Priority: Medium    Hyperglycemia 09/01/2014    Priority: Medium    Insomnia 02/24/2014    Priority: Medium    Diastolic dysfunction 40/81/4481    Priority: Medium    Hyperlipidemia 05/11/2007    Priority: Medium    Contact dermatitis 09/01/2014    Priority: Low   Former smoker 02/24/2014    Priority: Low   Chest pain 07/18/2012    Priority: Low   GERD (gastroesophageal reflux disease) 07/18/2012    Priority: Low   Prepatellar bursitis, right knee 06/22/2017    Priority: 1.   Displaced bimalleolar fracture of right lower leg, subsequent encounter for closed fracture with delayed healing 02/12/2016    Priority: 1.   Past Surgical History:  Procedure Laterality Date   CARDIOVERSION N/A 05/12/2017   Procedure: CARDIOVERSION;  Surgeon: Fay Records, MD;  Location: [facility];  Service: Cardiovascular;  Laterality: N/A;   COLONOSCOPY     LEFT HEART CATHETERIZATION WITH CORONARY ANGIOGRAM N/A 07/19/2012   Procedure: LEFT HEART CATHETERIZATION WITH CORONARY ANGIOGRAM;  Surgeon: Minus Breeding, MD;  Location: [facility] CATH LAB;  Service: Cardiovascular;  Laterality: N/A;   ORIF ANKLE FRACTURE Right  02/17/2016   Procedure: OPEN REDUCTION INTERNAL FIXATION (ORIF) BIMALLEOLAR ANKLE FRACTURE;  Surgeon: Leandrew Koyanagi, MD;  Location: [facility];  Service: Orthopedics;  Laterality: Right;   POLYPECTOMY      Family History  Problem Relation Age of Onset   Cancer Mother    Breast cancer Mother    Heart disease Father        hx A-Fib, 53 with MI   Colon cancer Father 82       metastatic to lung   Rectal cancer Neg Hx    Stomach cancer Neg Hx     Medications- reviewed and updated Current Outpatient Medications  Medication Sig Dispense Refill   apixaban (ELIQUIS) 5 MG TABS tablet TAKE 1 TABLET(5 MG) BY MOUTH TWICE DAILY 60 tablet 5   NON FORMULARY Cognium complete daily - Brain function     tamsulosin (FLOMAX) 0.4 MG CAPS capsule TAKE 2 CAPSULES(0.8 MG) BY MOUTH DAILY 60 capsule 5   No current facility-administered medications for this visit.    Allergies-reviewed and updated No Known Allergies  Social History   Social History Narrative   Married (wife [name] (bobby gail) also Dr. Yong Channel patient) [phone]. 3 step children (2 living). 3 stepgrandchildren.       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       Hobbies: bowling, golf, exercise    Objective  Objective:  BP 120/72 Comment: most recent home reading within a week  Pulse 89   Temp 98.2 F (36.8 C)   Ht '5\' 11"'$  (1.803 m)   Wt 192 lb 3.2 oz (87.2 kg)   SpO2 98%   BMI 26.81 kg/m  Gen: NAD, resting comfortably HEENT: Mucous membranes are moist. Oropharynx normal Neck: no thyromegaly CV: RRR no murmurs rubs or gallops Lungs: CTAB no crackles, wheeze, rhonchi Abdomen: soft/nontender/nondistended/normal bowel sounds. No rebound or guarding.  Ext: no edema Skin: warm, dry Neuro: grossly normal, moves all extremities, PERRLA    Assessment and Plan  71 y.o. male presenting for annual physical.  Health Maintenance counseling: 1. Anticipatory guidance: Patient counseled regarding regular dental  exams - needs to schedule q6 months follow up- looking for one in [place], eye exams -yearly,  avoiding smoking and second hand smoke , limiting alcohol to 2 beverages per day - no alcohol, no illicit drugs .   2. Risk factor reduction:  Advised patient of need for regular exercise and diet rich and fruits and vegetables to reduce risk of heart attack and stroke.  Exercise- twice a week typically- hour plus averaging 11k steps daily.  Diet/weight management-up 6 pounds from last year but down 7 pounds from September.  Wt Readings from Last 3 Encounters:  01/19/22 192 lb 3.2 oz (87.2 kg)  11/17/21 193 lb 6.4 oz (87.7 kg)  11/05/21 200 lb (90.7 kg)  3. Immunizations/screenings/ancillary studies-he plans on getting RSV vaccination and covid shot and prevnar 20, Shingrix concerned about side effects and holding off for now Immunization History  Administered Date(s) Administered   Influenza Split 10/20/2011   Influenza Whole 11/26/2008   Influenza, High Dose Seasonal PF 01/09/2015, 03/14/2016, 11/14/2017, 10/08/2018, 12/21/2020, 11/21/2021   Influenza,inj,Quad PF,6+ Mos 01/21/2013   Influenza-Unspecified 01/21/2014, 01/05/2017, 11/14/2017, 12/30/2019   Moderna SARS-COV2 Booster Vaccination 02/13/2020, 06/29/2020   Moderna Sars-Covid-2 Vaccination 05/01/2019, 05/29/2019   Pneumococcal Conjugate-13 03/09/2015   Pneumococcal-Unspecified 01/21/2014   Td 06/04/2008   Tdap 01/10/2019   Zoster, Live 10/11/2011  4. Prostate cancer screening- low risk prior psa trend- above technical age based screening recommendations but with good overall health he has preferred to continue to check- rectal exam 2021- will defer unless trend concerning . 1-2x a night nocturia- some variability- flomax helpful at 2  Lab Results  Component Value Date   PSA 1.15 01/18/2021   PSA 1.25 01/14/2020   PSA 1.44 01/10/2019   5. Colon cancer screening - Dr. Candis Schatz 11/11/21- 3 polyps precancerous but no high risk features-  "Given your history of small and low risk polyps, I think your risk of colon cancer is very low and would recommend against further colon cancer screening. 6. Skin cancer screening-follows with Dr. Tarri Glenn. advised regular sunscreen use. Denies worrisome, changing, or new skin lesions.  7. Smoking associated screening (lung cancer screening, AAA screen 65-75, UA)- Former smoker-quit smoking 30 years ago.  Have discussed AAA screening in past but he now had CT abd/pelvis on 11/06/19 with no aneurysm- no further follow up needed   8. 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 Tries to keep them moving. Worse in the cold.   # Permanent atrial fibrillation-left atrium dilation S: Rate controlled with no medication Anticoagulated with Eliquis 5 mg twice daily (through [place]) Chadsvasc score of 2 in 2020 Patient is  followed by cardiology: Dr. Domenic Polite A/P: appropriately anticoagulated and rate controlled without medicine- continue current medicine   #hypertension S: medication: Has been controlled without medication-Cardizem in the past Home readings #s: 120/70s BP Readings from Last 3 Encounters:  01/19/22 120/72  11/17/21 130/84  11/05/21 120/65  A/P: controlled at home (# listed above- was in high 130s on check here)- slightly high today but that is with recent stressors   #hyperlipidemia with aortic atherosclerosis S: Medication:No medication- has wanted to work on diet/exercise- mainly chicken  for meat The 10-year ASCVD risk score (Arnett DK, et al., 2019) is: 25.6%* (Cholesterol units were assumed) Lab Results  Component Value Date   CHOL 175 01/18/2021   HDL 49.50 01/18/2021   LDLCALC 114 (H) 01/18/2021   LDLDIRECT 117 (H) 01/14/2020   TRIG 55.0 01/18/2021   CHOLHDL 4 01/18/2021  A/P: update lipids and if LDL still over 70 he will proceed with ct calcium scoring- honestly we could start medicine based on aortic atherosclerosis but on other hand being careful because of prediabetes and increased risk of this with statin    # Hyperglycemia/insulin resistance/prediabetes-A1c as high as 5.9 in 2021 S:  Medication: None Exercise and diet- typically exercises at least 2 days a week A/P: hopefully stable- update a1c today.    #Vitamin D deficiency S: Medication: none Last vitamin D Lab Results  Component Value Date   VD25OH 15.36 (L) 01/18/2021  A/P: update D today- may need high dose or at least to take 1000 units daily   #BPH- on flomax 0.'8mg'$   - reasonable control  Recommended follow up: Return in about 1 year (around 01/20/2023) for physical or sooner if needed.Schedule b4 you leave.  Lab/Order associations: fasting   ICD-10-CM   1. Preventative health care  Z00.00     2. Hyperlipidemia, unspecified hyperlipidemia type  E78.5 CBC with Differential/Platelet    Comprehensive metabolic panel    Lipid panel    CT CARDIAC SCORING ([facility] LOCATIONS ONLY)    3. Hyperglycemia  R73.9 HgB A1c    4. BPH associated with nocturia  N40.1 PSA   R35.1     5. Vitamin D deficiency  E55.9 VITAMIN D 25 Hydroxy (Vit-D Deficiency, Fractures)    6. Aortic atherosclerosis (HCC) Chronic I70.0     7. Secondary hypercoagulable state ([place])  D68.69      No orders of the defined types were placed in this encounter.  Return precautions advised.  Garret Reddish, MD

## 2022-01-19 NOTE — Patient Instructions (Addendum)
Health Maintenance Due  Topic Date Due   Medicare Annual Wellness (AWV)  Never done  You are eligible to schedule your annual wellness visit with our nurse specialist Otila Kluver.  Please consider scheduling this before you leave today  Voltaren/diclofenac gel is reasonable since there is not so much systemic absorption- if you did have bleeding issues obviously we would stop   We will call you within two weeks about your referral to ct calcium scoring through [facility].  Their phone number is [phone].  Please call them if you have not heard in 1-2 weeks   Please stop by lab before you go If you have mychart- we will send your results within 3 business days of [place] receiving them.  If you do not have mychart- we will call you about results within 5 business days of [place] receiving them.  *please also note that you will see labs on mychart as soon as they post. I will later go in and write notes on them- will say "notes from Dr. Yong Channel"   Recommended follow up: Return in about 1 year (around 01/20/2023) for physical or sooner if needed.Schedule b4 you leave.

## 2022-01-20 ENCOUNTER — Other Ambulatory Visit: Payer: Self-pay | Admitting: Family Medicine

## 2022-01-20 MED ORDER — VITAMIN D (ERGOCALCIFEROL) 1.25 MG (50000 UNIT) PO CAPS: 50000.0000 [IU] | ORAL_CAPSULE | ORAL | 1 refills | Status: DC

## 2022-03-02 ENCOUNTER — Encounter: Payer: Self-pay | Admitting: Family Medicine

## 2022-03-02 ENCOUNTER — Ambulatory Visit
Admission: RE | Admit: 2022-03-02 | Discharge: 2022-03-02 | Disposition: A | Discharge status: Home / Self Care | Payer: No Typology Code available for payment source | Source: Ambulatory Visit | Attending: Family Medicine | Admitting: Family Medicine

## 2022-03-02 DIAGNOSIS — E785 Hyperlipidemia, unspecified: Secondary | ICD-10-CM

## 2022-03-02 DIAGNOSIS — I700 Atherosclerosis of aorta: Secondary | ICD-10-CM | POA: Diagnosis not present

## 2022-03-03 ENCOUNTER — Other Ambulatory Visit: Payer: Self-pay

## 2022-03-03 MED ORDER — ROSUVASTATIN CALCIUM 10 MG PO TABS: 10.0000 mg | ORAL_TABLET | Freq: Every day | ORAL | 3 refills | Status: DC

## 2022-03-21 ENCOUNTER — Ambulatory Visit
Admission: RE | Admit: 2022-03-21 | Discharge: 2022-03-21 | Disposition: A | Discharge status: Home / Self Care | Payer: Medicare HMO | Source: Ambulatory Visit | Attending: Nurse Practitioner | Admitting: Nurse Practitioner

## 2022-03-21 VITALS — BP 157/89 | HR 74 | Temp 98.2°F | Resp 18

## 2022-03-21 DIAGNOSIS — R21 Rash and other nonspecific skin eruption: Secondary | ICD-10-CM | POA: Diagnosis not present

## 2022-03-21 MED ORDER — DEXAMETHASONE SODIUM PHOSPHATE 10 MG/ML IJ SOLN
10.0000 mg | INTRAMUSCULAR | Status: AC
  Administered 2022-03-21: 10 mg via INTRAMUSCULAR

## 2022-03-21 MED ORDER — TRIAMCINOLONE ACETONIDE 0.1 % EX CREA: 1.0000 | TOPICAL_CREAM | Freq: Two times a day (BID) | CUTANEOUS | 0 refills | Status: DC

## 2022-03-21 NOTE — Discharge Instructions (Addendum)
You were given a steroid injection of Decadron 10 mg today. Apply medication as prescribed. May also take over-the-counter Zyrtec to help with itching. Avoid hot baths or showers while symptoms persist. Recommend taking lukewarm baths. May apply cool cloths to the area to help with itching or discomfort. Avoid scratching, rubbing, or manipulating the areas while symptoms persist. Recommend Aveeno colloidal oatmeal bath to use to help with drying and itching. If symptoms do not improve with this treatment, please follow-up in this clinic or with your primary care physician for further evaluation. Follow-up as needed.

## 2022-03-21 NOTE — ED Triage Notes (Signed)
Pt reports an itching rash in the back x 5 days after he used a patch.

## 2022-03-21 NOTE — ED Provider Notes (Signed)
[facility]    CSN: [id number] Arrival date & time: 03/21/22  1252      History   Chief Complaint Chief Complaint  Patient presents with   Appointment    1300    HPI Robert David is a 72 y.o. male.   The history is provided by the patient.   Patient presents for complaints of a rash to his lower back that started over the last several days.  Patient states he had back pain, and use the Salonpas patch to his lower back.  He states after he remove the patch, he must of had "an allergic reaction" to the patch.  He states that he does have itching, and bumps to the area.  Patient denies fever, chills, oozing, fluctuance, or drainage.  He also denies the use of any new foods, soaps, lotions, or detergents.  Patient states he has tried using calamine lotion and over-the-counter anti-itch cream for his symptoms with minimal relief.  Past Medical History:  Diagnosis Date   Headache(784.0)    Hyperlipidemia    Persistent atrial fibrillation ([place])    Seasonal allergies     Patient Active Problem List   Diagnosis Date Noted   Aortic atherosclerosis ([place]) 11/07/2019   Essential hypertension 03/25/2019   Vitamin D deficiency 01/25/2018   BPH associated with nocturia 06/22/2017   Prepatellar bursitis, right knee 06/22/2017   Displaced bimalleolar fracture of right lower leg, subsequent encounter for closed fracture with delayed healing 02/12/2016   Hyperglycemia 09/01/2014   Contact dermatitis 09/01/2014   Insomnia 02/24/2014   Former smoker 02/24/2014   Chest pain 07/18/2012   GERD (gastroesophageal reflux disease) 07/18/2012   Atrial fibrillation ([place]) 99991111   Diastolic dysfunction 99991111   Hyperlipidemia 05/11/2007    Past Surgical History:  Procedure Laterality Date   CARDIOVERSION N/A 05/12/2017   Procedure: CARDIOVERSION;  Surgeon: Fay Records, MD;  Location: [facility];  Service: Cardiovascular;  Laterality: N/A;   COLONOSCOPY     LEFT HEART  CATHETERIZATION WITH CORONARY ANGIOGRAM N/A 07/19/2012   Procedure: LEFT HEART CATHETERIZATION WITH CORONARY ANGIOGRAM;  Surgeon: Minus Breeding, MD;  Location: [facility] CATH LAB;  Service: Cardiovascular;  Laterality: N/A;   ORIF ANKLE FRACTURE Right 02/17/2016   Procedure: OPEN REDUCTION INTERNAL FIXATION (ORIF) BIMALLEOLAR ANKLE FRACTURE;  Surgeon: Leandrew Koyanagi, MD;  Location: [facility];  Service: Orthopedics;  Laterality: Right;   POLYPECTOMY         Home Medications    Prior to Admission medications   Medication Sig Start Date End Date Taking? Authorizing Provider  triamcinolone cream (KENALOG) 0.1 % Apply 1 Application topically 2 (two) times daily. 03/21/22  Yes Burnice Oestreicher-Warren, Alda Lea, NP  apixaban (ELIQUIS) 5 MG TABS tablet TAKE 1 TABLET(5 MG) BY MOUTH TWICE DAILY 04/12/21   Satira Sark, MD  NON FORMULARY Cognium complete daily - Brain function    [provider]  rosuvastatin (CRESTOR) 10 MG tablet Take 1 tablet (10 mg total) by mouth daily. 03/03/22   Marin Olp, MD  tamsulosin (FLOMAX) 0.4 MG CAPS capsule TAKE 2 CAPSULES(0.8 MG) BY MOUTH DAILY 07/20/21   Marin Olp, MD  Vitamin D, Ergocalciferol, (DRISDOL) 1.25 MG (50000 UNIT) CAPS capsule Take 1 capsule (50,000 Units total) by mouth every 7 (seven) days. 01/20/22   Marin Olp, MD    Family History Family History  Problem Relation Age of Onset   Cancer Mother    Breast cancer Mother  Heart disease Father        hx A-Fib, 61 with MI   Colon cancer Father 32       metastatic to lung   Rectal cancer Neg Hx    Stomach cancer Neg Hx     Social History Social History   Tobacco Use   Smoking status: Former    Packs/day: 1.00    Years: 10.00    Total pack years: 10.00    Types: Cigarettes    Quit date: 10/24/1988    Years since quitting: 33.4   Smokeless tobacco: Never  Vaping Use   Vaping Use: Never used  Substance Use Topics   Alcohol use: No    Alcohol/week: 0.0  standard drinks of alcohol   Drug use: No     Allergies   Patient has no known allergies.   Review of Systems Review of Systems Per HPI  Physical Exam Triage Vital Signs ED Triage Vitals  Enc Vitals Group     BP 03/21/22 1403 (!) 167/97     Pulse Rate 03/21/22 1403 74     Resp 03/21/22 1403 18     Temp 03/21/22 1403 98.2 F (36.8 C)     Temp Source 03/21/22 1403 Oral     SpO2 03/21/22 1403 98 %     Weight --      Height --      Head Circumference --      Peak Flow --      Pain Score 03/21/22 1402 0     Pain Loc --      Pain Edu? --      Excl. in [place]? --    No data found.  Updated Vital Signs BP (!) 157/89 (BP Location: Left Arm)   Pulse 74   Temp 98.2 F (36.8 C) (Oral)   Resp 18   SpO2 98%   Visual Acuity Right Eye Distance:   Left Eye Distance:   Bilateral Distance:    Right Eye Near:   Left Eye Near:    Bilateral Near:     Physical Exam Vitals and nursing note reviewed.  Constitutional:      General: He is not in acute distress.    Appearance: Normal appearance.  HENT:     Head: Normocephalic.  Eyes:     Extraocular Movements: Extraocular movements intact.     Pupils: Pupils are equal, round, and reactive to light.  Pulmonary:     Effort: Pulmonary effort is normal.  Musculoskeletal:     Cervical back: Normal range of motion.  Skin:    General: Skin is warm and dry.     Findings: Rash present. Rash is macular and papular.       Neurological:     General: No focal deficit present.     Mental Status: He is alert and oriented to person, place, and time.  Psychiatric:        Mood and Affect: Mood normal.        Behavior: Behavior normal.      UC Treatments / Results  Labs (all labs ordered are listed, but only abnormal results are displayed) Labs Reviewed - No data to display  EKG   Radiology No results found.  Procedures Procedures (including critical care time)  Medications Ordered in UC Medications  dexamethasone  (DECADRON) injection 10 mg (10 mg Intramuscular Given 03/21/22 1428)    Initial Impression / Assessment and Plan / UC Course  I have reviewed the  triage vital signs and the nursing notes.  Pertinent labs & imaging results that were available during my care of the patient were reviewed by me and considered in my medical decision making (see chart for details).  Patient is well-appearing, he is in no acute distress, he is hypertensive, but vital signs are otherwise stable.  Patient with a rash to the mid lower back, consistent with a possible allergic reaction to the Salonpas patch.  Rash is consistent with placement of the patch.  Dron 10 mg IM given in the clinic today to help with itching and discomfort.  Will start patient on triamcinolone cream 0.1% for his rash.  Supportive care recommendations were provided to the patient along with indications of when follow-up may be necessary.  Patient is in agreement with this plan of care, and verbalizes understanding.  All questions were answered.  Patient stable for discharge. Final Clinical Impressions(s) / UC Diagnoses   Final diagnoses:  Rash and nonspecific skin eruption     Discharge Instructions      You were given a steroid injection of Decadron 10 mg today. Apply medication as prescribed. May also take over-the-counter Zyrtec to help with itching. Avoid hot baths or showers while symptoms persist. Recommend taking lukewarm baths. May apply cool cloths to the area to help with itching or discomfort. Avoid scratching, rubbing, or manipulating the areas while symptoms persist. Recommend Aveeno colloidal oatmeal bath to use to help with drying and itching. If symptoms do not improve with this treatment, please follow-up in this clinic or with your primary care physician for further evaluation. Follow-up as needed.     ED Prescriptions     Medication Sig Dispense Auth. Provider   triamcinolone cream (KENALOG) 0.1 % Apply 1  Application topically 2 (two) times daily. 45 g Adaiah Morken-Warren, Alda Lea, NP      PDMP not reviewed this encounter.   Tish Men, NP 03/21/22 1448

## 2022-03-22 ENCOUNTER — Encounter: Payer: Self-pay | Admitting: Family Medicine

## 2022-03-30 ENCOUNTER — Encounter: Payer: Self-pay | Admitting: Family Medicine

## 2022-03-30 ENCOUNTER — Ambulatory Visit (INDEPENDENT_AMBULATORY_CARE_PROVIDER_SITE_OTHER): Payer: Medicare HMO | Admitting: Family Medicine

## 2022-03-30 VITALS — BP 116/90 | HR 89 | Temp 97.9°F | Ht 71.0 in | Wt 197.0 lb

## 2022-03-30 DIAGNOSIS — I700 Atherosclerosis of aorta: Secondary | ICD-10-CM

## 2022-03-30 DIAGNOSIS — I10 Essential (primary) hypertension: Secondary | ICD-10-CM

## 2022-03-30 DIAGNOSIS — I4821 Permanent atrial fibrillation: Secondary | ICD-10-CM

## 2022-03-30 DIAGNOSIS — E785 Hyperlipidemia, unspecified: Secondary | ICD-10-CM | POA: Diagnosis not present

## 2022-03-30 LAB — CBC WITH DIFFERENTIAL/PLATELET
Basophils Absolute: 0.1 10*3/uL (ref 0.0–0.1)
Basophils Relative: 0.7 % (ref 0.0–3.0)
Eosinophils Absolute: 0.2 10*3/uL (ref 0.0–0.7)
Eosinophils Relative: 2.7 % (ref 0.0–5.0)
HCT: 45.6 % (ref 39.0–52.0)
Hemoglobin: 15.2 g/dL (ref 13.0–17.0)
Lymphocytes Relative: 22.7 % (ref 12.0–46.0)
Lymphs Abs: 1.8 10*3/uL (ref 0.7–4.0)
MCHC: 33.4 g/dL (ref 30.0–36.0)
MCV: 88 fl (ref 78.0–100.0)
Monocytes Absolute: 0.7 10*3/uL (ref 0.1–1.0)
Monocytes Relative: 9 % (ref 3.0–12.0)
Neutro Abs: 5.1 10*3/uL (ref 1.4–7.7)
Neutrophils Relative %: 64.9 % (ref 43.0–77.0)
Platelets: 245 10*3/uL (ref 150.0–400.0)
RBC: 5.18 Mil/uL (ref 4.22–5.81)
RDW: 13.4 % (ref 11.5–15.5)
WBC: 7.9 10*3/uL (ref 4.0–10.5)

## 2022-03-30 LAB — URINALYSIS, ROUTINE W REFLEX MICROSCOPIC
Bilirubin Urine: NEGATIVE
Ketones, ur: NEGATIVE
Leukocytes,Ua: NEGATIVE
Nitrite: NEGATIVE
Specific Gravity, Urine: 1.025 (ref 1.000–1.030)
Total Protein, Urine: NEGATIVE
Urine Glucose: NEGATIVE
Urobilinogen, UA: 0.2 (ref 0.0–1.0)
pH: 5.5 (ref 5.0–8.0)

## 2022-03-30 LAB — TSH: TSH: 1.11 u[IU]/mL (ref 0.35–5.50)

## 2022-03-30 MED ORDER — DILTIAZEM HCL ER COATED BEADS 120 MG PO CP24: 120.0000 mg | ORAL_CAPSULE | Freq: Every day | ORAL | 5 refills | Status: DC

## 2022-03-30 NOTE — Assessment & Plan Note (Signed)
#  hypertension S: medication: Has been controlled without medication but recently has noted BP trending up- had noted 160s/90s about a week ago -at urgent care felt off and head pressure- still has mild head pressure but improving -was going ot stop tylenol to see if that helps -also oof note had steroid injection decadron 10 mg on 03/21/22 for reaction to salonpas patch Home readings #s:  He got 138/75 when he checked with me elevating his arm, when I checked I got 132/88 -his home readings have varied since the 13th as low as 102/82 and has high as 176/98 on home list he brings today. Home average 143/82 since the 13th.

## 2022-03-30 NOTE — Assessment & Plan Note (Signed)
#   Permanent atrial fibrillation-left atrium dilation S: Rate controlled with no medication but high normal Anticoagulated with Eliquis 5 mg twice daily (through [place]) Chadsvasc score of 2 in 2020 Patient is  followed by cardiology: Dr. Domenic Polite A/P: appropriately anticoagulated and rate controlled without medication-continue Eliquis but we opted to add diltiazem primarily for blood pressure control-see below- continue current medicine

## 2022-03-30 NOTE — Patient Instructions (Addendum)
Please stop by lab before you go If you have mychart- we will send your results within 3 business days of [place] receiving them.  If you do not have mychart- we will call you about results within 5 business days of [place] receiving them.  *please also note that you will see labs on mychart as soon as they post. I will later go in and write notes on them- will say "notes from Dr. Yong Channel"   Start diltiazem 144m extended release- update me in 2 weeks with home readings  Recommended follow up: Return for next already scheduled visit or sooner if needed.

## 2022-03-30 NOTE — Progress Notes (Signed)
Phone [phone] In person visit   Subjective:   Robert David is a 72 y.o. year old very pleasant male patient who presents for/with See problem oriented charting Chief Complaint  Patient presents with   Follow-up    (NO MASK)   Hypertension   Past Medical History-  Patient Active Problem List   Diagnosis Date Noted   Atrial fibrillation ([place]) 05/26/2009    Priority: High   Aortic atherosclerosis ([place]) 11/07/2019    Priority: Medium    Essential hypertension 03/25/2019    Priority: Medium    Vitamin D deficiency 01/25/2018    Priority: Medium    BPH associated with nocturia 06/22/2017    Priority: Medium    Hyperglycemia 09/01/2014    Priority: Medium    Insomnia 02/24/2014    Priority: Medium    Diastolic dysfunction 99991111    Priority: Medium    Hyperlipidemia 05/11/2007    Priority: Medium    Contact dermatitis 09/01/2014    Priority: Low   Former smoker 02/24/2014    Priority: Low   Chest pain 07/18/2012    Priority: Low   GERD (gastroesophageal reflux disease) 07/18/2012    Priority: Low   Prepatellar bursitis, right knee 06/22/2017    Priority: 1.   Displaced bimalleolar fracture of right lower leg, subsequent encounter for closed fracture with delayed healing 02/12/2016    Priority: 1.    Medications- reviewed and updated Current Outpatient Medications  Medication Sig Dispense Refill   diltiazem (CARDIZEM CD) 120 MG 24 hr capsule Take 1 capsule (120 mg total) by mouth daily. 30 capsule 5   apixaban (ELIQUIS) 5 MG TABS tablet TAKE 1 TABLET(5 MG) BY MOUTH TWICE DAILY 60 tablet 5   NON FORMULARY Cognium complete daily - Brain function     rosuvastatin (CRESTOR) 10 MG tablet Take 1 tablet (10 mg total) by mouth daily. 90 tablet 3   tamsulosin (FLOMAX) 0.4 MG CAPS capsule TAKE 2 CAPSULES(0.8 MG) BY MOUTH DAILY 60 capsule 5   triamcinolone cream (KENALOG) 0.1 % Apply 1 Application topically 2 (two) times daily. 45 g 0   Vitamin D, Ergocalciferol,  (DRISDOL) 1.25 MG (50000 UNIT) CAPS capsule Take 1 capsule (50,000 Units total) by mouth every 7 (seven) days. 13 capsule 1   No current facility-administered medications for this visit.     Objective:  BP (!) 116/90   Pulse 89   Temp 97.9 F (36.6 C)   Ht 5' 11"$  (1.803 m)   Wt 197 lb (89.4 kg)   SpO2 98%   BMI 27.48 kg/m  Gen: NAD, resting comfortably CV: RRR no murmurs rubs or gallops Lungs: CTAB no crackles, wheeze, rhonchi Ext: no edema Skin: warm, dry    Assessment and Plan   # Permanent atrial fibrillation-left atrium dilation S: Rate controlled with no medication but high normal Anticoagulated with Eliquis 5 mg twice daily (through [place]) Chadsvasc score of 2 in 2020 Patient is  followed by cardiology: Dr. Domenic Polite A/P: appropriately anticoagulated and rate controlled without medication-continue Eliquis but we opted to add diltiazem primarily for blood pressure control-see below- continue current medicine  #hypertension S: medication: Has been controlled without medication but recently has noted BP trending up- had noted 160s/90s about a week ago -at urgent care felt off and head pressure- still has mild head pressure but improving -was going ot stop tylenol to see if that helps -also oof note had steroid injection decadron 10 mg on 03/21/22 for reaction to salonpas patch  Home readings #s:  He got 138/75 when he checked with me elevating his arm, when I checked I got 132/88 -his home readings have varied since the 13th as low as 102/82 and has high as 176/98 on home list he brings today. Home average 143/82 since the 13th.   Still has sensitivity over rash- doing better though.  BP Readings from Last 3 Encounters:  03/30/22 (!) 116/90  03/21/22 (!) 157/89  01/19/22 120/72  A/P: Hypertension poorly controlled at home with recent monitoring and blood pressure cuff seems reasonably accurate.  We opted to restart diltiazem 120 mg extended release which he had been on in  the past-gives dual benefit for rate control if needed for A-fib   #hyperlipidemia with aortic atherosclerosis S: Medication: Rosuvastatin 10 mg daily -CT calcium scoring of 446 which is 66 percentile in January 2024 A/P: Suspect improved-update LDL today to document improvement on rosuvastatin 10 mg.  LDL goal 70 or less for coronary artery calcium as well as aortic atherosclerosis (presumed stable-continue current medications) - Statin-slightly increased prediabetes risk at this recent Decadron injection but too soon for A1c repeat   Recommended follow up: Return for next already scheduled visit or sooner if needed. Future Appointments  Date Time Provider [place]  01/23/2023 10:00 AM Marin Olp, MD [facility]    Lab/Order associations:   ICD-10-CM   1. Essential hypertension  I10 CBC with Differential/Platelet    Comprehensive metabolic panel    TSH    Urinalysis, Routine w reflex microscopic    2. Hyperlipidemia, unspecified hyperlipidemia type  E78.5 TSH    Urinalysis, Routine w reflex microscopic    LDL cholesterol, direct    3. Permanent atrial fibrillation (HCC)  I48.21       Meds ordered this encounter  Medications   diltiazem (CARDIZEM CD) 120 MG 24 hr capsule    Sig: Take 1 capsule (120 mg total) by mouth daily.    Dispense:  30 capsule    Refill:  5    Return precautions advised.  Garret Reddish, MD

## 2022-03-31 LAB — COMPREHENSIVE METABOLIC PANEL
ALT: 32 U/L (ref 0–53)
AST: 23 U/L (ref 0–37)
Albumin: 4.7 g/dL (ref 3.5–5.2)
Alkaline Phosphatase: 49 U/L (ref 39–117)
BUN: 13 mg/dL (ref 6–23)
CO2: 24 mEq/L (ref 19–32)
Calcium: 10.1 mg/dL (ref 8.4–10.5)
Chloride: 105 mEq/L (ref 96–112)
Creatinine, Ser: 1.1 mg/dL (ref 0.40–1.50)
GFR: 66.7 mL/min (ref >60.00)
Glucose, Bld: 92 mg/dL (ref 70–99)
Potassium: 4.8 mEq/L (ref 3.5–5.1)
Sodium: 144 mEq/L (ref 135–145)
Total Bilirubin: 0.6 mg/dL (ref 0.2–1.2)
Total Protein: 7 g/dL (ref 6.0–8.3)

## 2022-03-31 LAB — LDL CHOLESTEROL, DIRECT: Direct LDL: 43 mg/dL

## 2022-04-14 ENCOUNTER — Encounter: Payer: Self-pay | Admitting: Family Medicine

## 2022-04-21 ENCOUNTER — Encounter: Payer: Self-pay | Admitting: Family Medicine

## 2022-04-21 ENCOUNTER — Ambulatory Visit (INDEPENDENT_AMBULATORY_CARE_PROVIDER_SITE_OTHER): Payer: Medicare HMO | Admitting: Family Medicine

## 2022-04-21 VITALS — BP 112/66 | HR 68 | Temp 98.4°F | Ht 71.0 in | Wt 200.8 lb

## 2022-04-21 DIAGNOSIS — I4821 Permanent atrial fibrillation: Secondary | ICD-10-CM

## 2022-04-21 DIAGNOSIS — B9689 Other specified bacterial agents as the cause of diseases classified elsewhere: Secondary | ICD-10-CM | POA: Diagnosis not present

## 2022-04-21 DIAGNOSIS — I10 Essential (primary) hypertension: Secondary | ICD-10-CM

## 2022-04-21 DIAGNOSIS — J329 Chronic sinusitis, unspecified: Secondary | ICD-10-CM | POA: Diagnosis not present

## 2022-04-21 MED ORDER — AMOXICILLIN-POT CLAVULANATE 875-125 MG PO TABS: 1.0000 | ORAL_TABLET | Freq: Two times a day (BID) | ORAL | 0 refills | Status: AC

## 2022-04-21 NOTE — Patient Instructions (Addendum)
drastic improvement in blood pressure- continue current medications. Does get some readings down closer to 110 and can feel more lightheaded- encouraged to push hydration on days like that and if more persistent issues to let me know - may need to stop or change meds   With lingering congestion, sinus pressure, fatigue after 3 weeks of illness- we opted to treat for bacterial sinusitis with augmentin- if symptoms do not clear within 2 weeks please see [place] back or sooner if worsens -with allergy season could consider clariitin or allegra before bed for 2 weeks as well  Recommended follow up: Return for next already scheduled visit or sooner if needed.

## 2022-04-21 NOTE — Progress Notes (Signed)
Phone [phone] In person visit   Subjective:   Robert David is a 72 y.o. year old very pleasant male patient who presents for/with See problem oriented charting Chief Complaint  Patient presents with   Cough    Pt c/o cough that he has had for a few weeks, it has now become deeper but now its better (see mychart).    Hypertension    Pt is here to f/u on bp also.    Past Medical History-  Patient Active Problem List   Diagnosis Date Noted   Atrial fibrillation ([place]) 05/26/2009    Priority: High   Aortic atherosclerosis ([place]) 11/07/2019    Priority: Medium    Essential hypertension 03/25/2019    Priority: Medium    Vitamin D deficiency 01/25/2018    Priority: Medium    BPH associated with nocturia 06/22/2017    Priority: Medium    Hyperglycemia 09/01/2014    Priority: Medium    Insomnia 02/24/2014    Priority: Medium    Diastolic dysfunction 99991111    Priority: Medium    Hyperlipidemia 05/11/2007    Priority: Medium    Contact dermatitis 09/01/2014    Priority: Low   Former smoker 02/24/2014    Priority: Low   Chest pain 07/18/2012    Priority: Low   GERD (gastroesophageal reflux disease) 07/18/2012    Priority: Low   Prepatellar bursitis, right knee 06/22/2017    Priority: 1.   Displaced bimalleolar fracture of right lower leg, subsequent encounter for closed fracture with delayed healing 02/12/2016    Priority: 1.    Medications- reviewed and updated Current Outpatient Medications  Medication Sig Dispense Refill   amoxicillin-clavulanate (AUGMENTIN) 875-125 MG tablet Take 1 tablet by mouth 2 (two) times daily for 7 days. 14 tablet 0   apixaban (ELIQUIS) 5 MG TABS tablet TAKE 1 TABLET(5 MG) BY MOUTH TWICE DAILY 60 tablet 5   diltiazem (CARDIZEM CD) 120 MG 24 hr capsule Take 1 capsule (120 mg total) by mouth daily. 30 capsule 5   NON FORMULARY Cognium complete daily - Brain function     rosuvastatin (CRESTOR) 10 MG tablet Take 1 tablet (10 mg total) by  mouth daily. 90 tablet 3   tamsulosin (FLOMAX) 0.4 MG CAPS capsule TAKE 2 CAPSULES(0.8 MG) BY MOUTH DAILY 60 capsule 5   triamcinolone cream (KENALOG) 0.1 % Apply 1 Application topically 2 (two) times daily. 45 g 0   Vitamin D, Ergocalciferol, (DRISDOL) 1.25 MG (50000 UNIT) CAPS capsule Take 1 capsule (50,000 Units total) by mouth every 7 (seven) days. 13 capsule 1   No current facility-administered medications for this visit.     Objective:  BP 112/66 Comment: home reading from yesterday morning  Pulse 68   Temp 98.4 F (36.9 C)   Ht '5\' 11"'$  (1.803 m)   Wt 200 lb 12.8 oz (91.1 kg)   SpO2 98%   BMI 28.01 kg/m  Gen: NAD, resting comfortably Right frontal sinus tenderness, nasal turbinates edematous and erythematous with primarily clear discharge, pharynx with mild signs of drainage CV: Regular rate no murmurs rubs or gallops Lungs: CTAB no crackles, wheeze, rhonchi Ext: no edema Skin: warm, dry    Assessment and Plan   # Subacute cough/lingering sinus congestion S: Patient reports cough for several weeks- maybe 3 weeks ago.  Started become deeper  and had a lot of congestion but in recent days has started to improve again. No fever during any of this. No shortness of  breath or wheeze. Wonders if allergies contributed. He has lingering sinus congestion- clear mixed with yellow- and overall feeling of fullness. Also feels fatigued - nyquil helped some.  A/P: lungs clear- but With lingering congestion, sinus pressure, fatigue after 3 weeks of illness- we opted to treat for bacterial sinusitis with augmentin- if symptoms do not clear within 2 weeks please see [place] back or sooner if worsens -with allergy season could consider clariitin or allegra before bed for 2 weeks as well  # Permanent atrial fibrillation-left atrium dilation S: Rate controlled with diltiazem 120 mg ER Anticoagulated with Eliquis 5 mg twice daily (through [place]) A/P: appropriately anticoagulated and rate controlled-  continue current medicine    #hypertension S: medication: Diltiazem 120 mg extended release Home readings #s: At last visit average which had been 143/82 on home readings-today   Reports over last 24 readings since starting medicine- 131/73 avg 130.9583 [id number]     BP Readings from Last 3 Encounters:  04/21/22 112/66  03/30/22 (!) 116/90  03/21/22 (!) 157/89    A/P: drastic improvement in blood pressure- continue current medications. Does get some readings down closer to 110 and can feel more lightheaded- encouraged to push hydration on days like that and if more persistent issues to let me know - may need to stop or change meds    Recommended follow up: Return for next already scheduled visit or sooner if needed. Future Appointments  Date Time Provider [place]  01/23/2023 10:00 AM Marin Olp, MD [facility]    Lab/Order associations:   ICD-10-CM   1. Essential hypertension  I10     2. Permanent atrial fibrillation (HCC)  I48.21     3. Bacterial sinusitis  J32.9    B96.89      Return precautions advised.  Garret Reddish, MD

## 2022-04-28 ENCOUNTER — Encounter: Payer: Self-pay | Admitting: Family Medicine

## 2022-06-09 ENCOUNTER — Encounter: Payer: Self-pay | Admitting: Family Medicine

## 2022-07-06 DIAGNOSIS — D485 Neoplasm of uncertain behavior of skin: Secondary | ICD-10-CM | POA: Diagnosis not present

## 2022-07-06 DIAGNOSIS — Z1283 Encounter for screening for malignant neoplasm of skin: Secondary | ICD-10-CM | POA: Diagnosis not present

## 2022-07-06 DIAGNOSIS — L570 Actinic keratosis: Secondary | ICD-10-CM | POA: Diagnosis not present

## 2022-07-20 DIAGNOSIS — H524 Presbyopia: Secondary | ICD-10-CM | POA: Diagnosis not present

## 2022-07-20 DIAGNOSIS — H52209 Unspecified astigmatism, unspecified eye: Secondary | ICD-10-CM | POA: Diagnosis not present

## 2022-07-20 DIAGNOSIS — H5203 Hypermetropia, bilateral: Secondary | ICD-10-CM | POA: Diagnosis not present

## 2022-08-01 ENCOUNTER — Encounter: Payer: Self-pay | Admitting: Family Medicine

## 2022-08-22 ENCOUNTER — Ambulatory Visit: Payer: Medicare HMO

## 2022-08-22 VITALS — BP 138/82 | HR 75 | Temp 98.5°F | Wt 203.4 lb

## 2022-08-22 DIAGNOSIS — Z0000 Encounter for general adult medical examination without abnormal findings: Secondary | ICD-10-CM

## 2022-08-22 NOTE — Progress Notes (Signed)
Subjective:   Robert David is a 72 y.o. male who presents for Medicare Annual/Subsequent preventive examination.  Visit Complete: In person  Review of Systems     Cardiac Risk Factors include: advanced age (>36men, >40 women);hypertension;dyslipidemia;male gender     Objective:    Today's Vitals   08/22/22 0843 08/22/22 0856  BP: (!) 150/84 138/82  Pulse: 75   Temp: 98.5 F (36.9 C)   SpO2: 97%   Weight: 203 lb 6.4 oz (92.3 kg)    Body mass index is 28.37 kg/m.     05/12/2017    9:50 AM 02/17/2016    7:09 AM 02/12/2016    4:14 PM 04/29/2014    8:43 AM 07/19/2012    6:00 AM  Advanced Directives  Does Patient Have a Medical Advance Directive? No No No No Patient does not have advance directive  Would patient like information on creating a medical advance directive? No - Patient declined      Pre-existing out of facility DNR order (yellow form or pink MOST form)     No    Current Medications (verified) Outpatient Encounter Medications as of 08/22/2022  Medication Sig   ABRYSVO 120 MCG/0.5ML injection    apixaban (ELIQUIS) 5 MG TABS tablet TAKE 1 TABLET(5 MG) BY MOUTH TWICE DAILY   diltiazem (CARDIZEM CD) 120 MG 24 hr capsule Take 1 capsule (120 mg total) by mouth daily.   NON FORMULARY Cognium complete daily - Brain function   rosuvastatin (CRESTOR) 10 MG tablet Take 1 tablet (10 mg total) by mouth daily.   tamsulosin (FLOMAX) 0.4 MG CAPS capsule TAKE 2 CAPSULES(0.8 MG) BY MOUTH DAILY   [DISCONTINUED] triamcinolone cream (KENALOG) 0.1 % Apply 1 Application topically 2 (two) times daily.   [DISCONTINUED] Vitamin D, Ergocalciferol, (DRISDOL) 1.25 MG (50000 UNIT) CAPS capsule Take 1 capsule (50,000 Units total) by mouth every 7 (seven) days.   No facility-administered encounter medications on file as of 08/22/2022.    Allergies (verified) Patient has no known allergies.   History: Past Medical History:  Diagnosis Date   Headache(784.0)    Hyperlipidemia    Persistent  atrial fibrillation (HCC)    Seasonal allergies    Past Surgical History:  Procedure Laterality Date   CARDIOVERSION N/A 05/12/2017   Procedure: CARDIOVERSION;  Surgeon: Pricilla Riffle, MD;  [facility] ENDOSCOPY;  Service: Cardiovascular;  Laterality: N/A;   COLONOSCOPY     LEFT HEART CATHETERIZATION WITH CORONARY ANGIOGRAM N/A 07/19/2012   Procedure: LEFT HEART CATHETERIZATION WITH CORONARY ANGIOGRAM;  Surgeon: [name], MD;  Location: [facility] CATH LAB;  Service: Cardiovascular;  Laterality: N/A;   ORIF ANKLE FRACTURE Right 02/17/2016   Procedure: OPEN REDUCTION INTERNAL FIXATION (ORIF) BIMALLEOLAR ANKLE FRACTURE;  Surgeon: Tarry Kos, MD;  Location: [facility];  Service: Orthopedics;  Laterality: Right;   POLYPECTOMY     Family History  Problem Relation Age of Onset   Cancer Mother    Breast cancer Mother    Heart disease Father        hx A-Fib, 68 with MI   Colon cancer Father 41       metastatic to lung   Rectal cancer Neg Hx    Stomach cancer Neg Hx    Social History   Socioeconomic History   Marital status: Widowed    Spouse name: Not on file   Number of children: Not on file   Years of education: Not on file   Highest education  level: Not on file  Occupational History   Not on file  Tobacco Use   Smoking status: Former    Current packs/day: 0.00    Average packs/day: 1 pack/day for 10.0 years (10.0 ttl pk-yrs)    Types: Cigarettes    Start date: 10/25/1978    Quit date: 10/24/1988    Years since quitting: 33.8   Smokeless tobacco: Never  Vaping Use   Vaping status: Never Used  Substance and Sexual Activity   Alcohol use: No    Alcohol/week: 0.0 standard drinks of alcohol   Drug use: No   Sexual activity: Not on file  Other Topics Concern   Not on file  Social History Narrative   Married (wife Dondra Spry (bobby gail) also Dr. Durene Cal patient) 73. 3 step children (2 living). 3 stepgrandchildren.       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       Hobbies: bowling, golf, exercise    Social Determinants of Health   Financial Resource Strain: Low Risk  (08/22/2022)   Overall Financial Resource Strain (CARDIA)    Difficulty of Paying Living Expenses: Not hard at all  Food Insecurity: No Food Insecurity (08/22/2022)   Hunger Vital Sign    Worried About Running Out of Food in the Last Year: Never true    Ran Out of Food in the Last Year: Never true  Transportation Needs: No Transportation Needs (08/22/2022)   PRAPARE - Administrator, Civil Service (Medical): No    Lack of Transportation (Non-Medical): No  Physical Activity: Sufficiently Active (08/22/2022)   Exercise Vital Sign    Days of Exercise per Week: 7 days    Minutes of Exercise per Session: 90 min  Stress: No Stress Concern Present (08/22/2022)   [employer] of Occupational Health - Occupational Stress Questionnaire    Feeling of Stress : Not at all  Social Connections: Moderately Isolated (08/22/2022)   Social Connection and Isolation Panel [NHANES]    Frequency of Communication with Friends and Family: More than three times a week    Frequency of Social Gatherings with Friends and Family: More than three times a week    Attends Religious Services: More than 4 times per year    Active Member of [employer] or Organizations: No    Attends Banker Meetings: Never    Marital Status: Widowed    Tobacco Counseling Counseling given: Not Answered   Clinical Intake:  Pre-visit preparation completed: Yes  Pain : No/denies pain     BMI - recorded: 28.37 Nutritional Status: BMI 25 -29 Overweight Nutritional Risks: None Diabetes: No  How often do you need to have someone help you when you read instructions, pamphlets, or other written materials from your doctor or pharmacy?: 1 - Never  Interpreter Needed?: No  Information entered by :: Lanier Ensign, LPN   Activities of Daily Living    08/22/2022    8:49 AM  In your  present state of health, do you have any difficulty performing the following activities:  Hearing? 0  Vision? 0  Difficulty concentrating or making decisions? 0  Walking or climbing stairs? 0  Dressing or bathing? 0  Doing errands, shopping? 0  Preparing Food and eating ? N  Using the Toilet? N  In the past six months, have you accidently leaked urine? N  Do you have problems with loss of bowel control? N  Managing your Medications? N  Managing your Finances?  N  Housekeeping or managing your Housekeeping? N    Patient Care Team: Shelva Majestic, MD as PCP - General (Family Medicine) Jonelle Sidle, MD as PCP - Cardiology (Cardiology)  Indicate any recent Medical Services you may have received from other than [facility] providers in the past year (date may be approximate).     Assessment:   This is a routine wellness examination for [employer].  Hearing/Vision screen Hearing Screening - Comments:: Pt denies any hearing issues  Vision Screening - Comments:: Pt follows up with [facility] for annual eye exams   Dietary issues and exercise activities discussed:     Goals Addressed             This Visit's Progress    Patient Stated       Lose a few more lbs        Depression Screen    08/22/2022    8:51 AM 01/19/2022    9:57 AM 01/18/2021    9:24 AM 01/14/2020    8:27 AM 08/23/2019    8:26 AM 01/10/2019    9:24 AM 12/18/2018    2:26 PM  PHQ 2/9 Scores  PHQ - 2 Score 1 0 0 0 0 0 0  PHQ- 9 Score  0 0  2      Fall Risk    08/22/2022    8:53 AM 01/19/2022    9:57 AM 01/18/2021    8:26 AM 01/14/2020    8:27 AM 03/25/2019   11:20 AM  Fall Risk   Falls in the past year? 0 0 0 0 0  Number falls in past yr: 0 0 0 0 0  Injury with Fall? 0 0 0 0 0  Risk for fall due to : Impaired vision No Fall Risks     Follow up Falls prevention discussed Falls evaluation completed       MEDICARE RISK AT HOME:  Medicare Risk at Home - 08/22/22 0853     Any stairs in or around  the home? Yes    If so, are there any without handrails? No    Home free of loose throw rugs in walkways, pet beds, electrical cords, etc? Yes    Adequate lighting in your home to reduce risk of falls? Yes    Life alert? No    Use of a cane, walker or w/c? No    Grab bars in the bathroom? Yes    Shower chair or bench in shower? No    Elevated toilet seat or a handicapped toilet? No             TIMED UP AND GO:  Was the test performed?  No    Cognitive Function:        08/22/2022    8:54 AM  6CIT Screen  What Year? 0 points  What month? 0 points  What time? 0 points  Count back from 20 0 points  Months in reverse 0 points  Repeat phrase 0 points  Total Score 0 points    Immunizations Immunization History  Administered Date(s) Administered   Influenza Split 10/20/2011   Influenza Whole 11/26/2008   Influenza, High Dose Seasonal PF 01/09/2015, 03/14/2016, 11/14/2017, 10/08/2018, 12/21/2020, 11/21/2021   Influenza,inj,Quad PF,6+ Mos 01/21/2013   Influenza-Unspecified 01/21/2014, 01/05/2017, 11/14/2017, 12/30/2019   Moderna SARS-COV2 Booster Vaccination 02/13/2020, 06/29/2020   Moderna Sars-Covid-2 Vaccination 05/01/2019, 05/29/2019   Pneumococcal Conjugate-13 03/09/2015   Pneumococcal-Unspecified 01/21/2014   Respiratory Syncytial Virus Vaccine,Recomb Aduvanted(Arexvy)  02/25/2022   Td 06/04/2008   Tdap 01/10/2019   Zoster, Live 10/11/2011    TDAP status: Up to date  Flu Vaccine status: Up to date  Pneumococcal vaccine status: Declined,  Education has been provided regarding the importance of this vaccine but patient still declined. Advised may receive this vaccine at local pharmacy or Health Dept. Aware to provide a copy of the vaccination record if obtained from local pharmacy or Health Dept. Verbalized acceptance and understanding.   Covid-19 vaccine status: Completed vaccines  Qualifies for Shingles Vaccine? Yes   Zostavax completed No   Shingrix  Completed?: No.    Education has been provided regarding the importance of this vaccine. Patient has been advised to call insurance company to determine out of pocket expense if they have not yet received this vaccine. Advised may also receive vaccine at local pharmacy or Health Dept. Verbalized acceptance and understanding.  Screening Tests Health Maintenance  Topic Date Due   Zoster Vaccines- Shingrix (1 of 2) 12/13/1998   COVID-19 Vaccine (5 - 2023-24 season) 10/08/2021   Pneumonia Vaccine 5+ Years old (2 of 2 - PPSV23 or PCV20) 01/20/2023 (Originally 03/08/2016)   INFLUENZA VACCINE  09/08/2022   Medicare Annual Wellness (AWV)  08/22/2023   Colonoscopy  11/06/2026   DTaP/Tdap/Td (3 - Td or Tdap) 01/09/2029   Hepatitis C Screening  Addressed   HPV VACCINES  Aged Out    Health Maintenance  Health Maintenance Due  Topic Date Due   Zoster Vaccines- Shingrix (1 of 2) 12/13/1998   COVID-19 Vaccine (5 - 2023-24 season) 10/08/2021    Colorectal cancer screening: Type of screening: Colonoscopy. Completed 11/05/21. Repeat every 5 years   Additional Screening:  Hepatitis C Screening: Completed 03/09/15  Vision Screening: Recommended annual ophthalmology exams for early detection of glaucoma and other disorders of the eye. Is the patient up to date with their annual eye exam?  Yes  Who is the provider or what is the name of the office in which the patient attends annual eye exams? [facility]  If pt is not established with a provider, would they like to be referred to a provider to establish care? No .   Dental Screening: Recommended annual dental exams for proper oral hygiene  Community Resource Referral / Chronic Care Management: CRR required this visit?  No   CCM required this visit?  No     Plan:     I have personally reviewed and noted the following in the patient's chart:   Medical and social history Use of alcohol, tobacco or illicit drugs  Current medications and  supplements including opioid prescriptions. Patient is not currently taking opioid prescriptions. Functional ability and status Nutritional status Physical activity Advanced directives List of other physicians Hospitalizations, surgeries, and ER visits in previous 12 months Vitals Screenings to include cognitive, depression, and falls Referrals and appointments  In addition, I have reviewed and discussed with patient certain preventive protocols, quality metrics, and best practice recommendations. A written personalized care plan for preventive services as well as general preventive health recommendations were provided to patient.     Marzella Schlein, LPN   1/61/0960   After Visit Summary: (Declined) Due to this being a telephonic visit, with patients personalized plan was offered to patient but patient Declined AVS at this time   Nurse Notes: none

## 2022-08-22 NOTE — Patient Instructions (Signed)
Robert David , Thank you for taking time to come for your Medicare Wellness Visit. I appreciate your ongoing commitment to your health goals. Please review the following plan we discussed and let me know if I can assist you in the future.   These are the goals we discussed:  Goals   None     This is a list of the screening recommended for you and due dates:  Health Maintenance  Topic Date Due   Medicare Annual Wellness Visit  Never done   Zoster (Shingles) Vaccine (1 of 2) 12/13/1998   COVID-19 Vaccine (5 - 2023-24 season) 10/08/2021   Pneumonia Vaccine (2 of 2 - PPSV23 or PCV20) 01/20/2023*   Flu Shot  09/08/2022   Colon Cancer Screening  11/06/2026   DTaP/Tdap/Td vaccine (3 - Td or Tdap) 01/09/2029   Hepatitis C Screening  Addressed   HPV Vaccine  Aged Out  *Topic was postponed. The date shown is not the original due date.    Advanced directives: Please bring a copy of your health care power of attorney and living will to the office at your convenience.  Conditions/risks identified: lose a few more lbs   Next appointment: Follow up in one year for your annual wellness visit.   Preventive Care 32 Years and Older, Male  Preventive care refers to lifestyle choices and visits with your health care provider that can promote health and wellness. What does preventive care include? A yearly physical exam. This is also called an annual well check. Dental exams once or twice a year. Routine eye exams. Ask your health care provider how often you should have your eyes checked. Personal lifestyle choices, including: Daily care of your teeth and gums. Regular physical activity. Eating a healthy diet. Avoiding tobacco and drug use. Limiting alcohol use. Practicing safe sex. Taking low doses of aspirin every day. Taking vitamin and mineral supplements as recommended by your health care provider. What happens during an annual well check? The services and screenings done by your health  care provider during your annual well check will depend on your age, overall health, lifestyle risk factors, and family history of disease. Counseling  Your health care provider may ask you questions about your: Alcohol use. Tobacco use. Drug use. Emotional well-being. Home and relationship well-being. Sexual activity. Eating habits. History of falls. Memory and ability to understand (cognition). Work and work Astronomer. Screening  You may have the following tests or measurements: Height, weight, and BMI. Blood pressure. Lipid and cholesterol levels. These may be checked every 5 years, or more frequently if you are over 59 years old. Skin check. Lung cancer screening. You may have this screening every year starting at age 66 if you have a 30-pack-year history of smoking and currently smoke or have quit within the past 15 years. Fecal occult blood test (FOBT) of the stool. You may have this test every year starting at age 82. Flexible sigmoidoscopy or colonoscopy. You may have a sigmoidoscopy every 5 years or a colonoscopy every 10 years starting at age 25. Prostate cancer screening. Recommendations will vary depending on your family history and other risks. Hepatitis C blood test. Hepatitis B blood test. Sexually transmitted disease (STD) testing. Diabetes screening. This is done by checking your blood sugar (glucose) after you have not eaten for a while (fasting). You may have this done every 1-3 years. Abdominal aortic aneurysm (AAA) screening. You may need this if you are a current or former smoker. Osteoporosis. You may  be screened starting at age 44 if you are at high risk. Talk with your health care provider about your test results, treatment options, and if necessary, the need for more tests. Vaccines  Your health care provider may recommend certain vaccines, such as: Influenza vaccine. This is recommended every year. Tetanus, diphtheria, and acellular pertussis (Tdap, Td)  vaccine. You may need a Td booster every 10 years. Zoster vaccine. You may need this after age 29. Pneumococcal 13-valent conjugate (PCV13) vaccine. One dose is recommended after age 18. Pneumococcal polysaccharide (PPSV23) vaccine. One dose is recommended after age 5. Talk to your health care provider about which screenings and vaccines you need and how often you need them. This information is not intended to replace advice given to you by your health care provider. Make sure you discuss any questions you have with your health care provider. Document Released: 02/20/2015 Document Revised: 10/14/2015 Document Reviewed: 11/25/2014 Elsevier Interactive Patient Education  2017 [employer].  Fall Prevention in the Home Falls can cause injuries. They can happen to people of all ages. There are many things you can do to make your home safe and to help prevent falls. What can I do on the outside of my home? Regularly fix the edges of walkways and driveways and fix any cracks. Remove anything that might make you trip as you walk through a door, such as a raised step or threshold. Trim any bushes or trees on the path to your home. Use bright outdoor lighting. Clear any walking paths of anything that might make someone trip, such as rocks or tools. Regularly check to see if handrails are loose or broken. Make sure that both sides of any steps have handrails. Any raised decks and porches should have guardrails on the edges. Have any leaves, snow, or ice cleared regularly. Use sand or salt on walking paths during winter. Clean up any spills in your garage right away. This includes oil or grease spills. What can I do in the bathroom? Use night lights. Install grab bars by the toilet and in the tub and shower. Do not use towel bars as grab bars. Use non-skid mats or decals in the tub or shower. If you need to sit down in the shower, use a plastic, non-slip stool. Keep the floor dry. Clean up any  water that spills on the floor as soon as it happens. Remove soap buildup in the tub or shower regularly. Attach bath mats securely with double-sided non-slip rug tape. Do not have throw rugs and other things on the floor that can make you trip. What can I do in the bedroom? Use night lights. Make sure that you have a light by your bed that is easy to reach. Do not use any sheets or blankets that are too big for your bed. They should not hang down onto the floor. Have a firm chair that has side arms. You can use this for support while you get dressed. Do not have throw rugs and other things on the floor that can make you trip. What can I do in the kitchen? Clean up any spills right away. Avoid walking on wet floors. Keep items that you use a lot in easy-to-reach places. If you need to reach something above you, use a strong step stool that has a grab bar. Keep electrical cords out of the way. Do not use floor polish or wax that makes floors slippery. If you must use wax, use non-skid floor wax. Do not  have throw rugs and other things on the floor that can make you trip. What can I do with my stairs? Do not leave any items on the stairs. Make sure that there are handrails on both sides of the stairs and use them. Fix handrails that are broken or loose. Make sure that handrails are as long as the stairways. Check any carpeting to make sure that it is firmly attached to the stairs. Fix any carpet that is loose or worn. Avoid having throw rugs at the top or bottom of the stairs. If you do have throw rugs, attach them to the floor with carpet tape. Make sure that you have a light switch at the top of the stairs and the bottom of the stairs. If you do not have them, ask someone to add them for you. What else can I do to help prevent falls? Wear shoes that: Do not have high heels. Have rubber bottoms. Are comfortable and fit you well. Are closed at the toe. Do not wear sandals. If you use a  stepladder: Make sure that it is fully opened. Do not climb a closed stepladder. Make sure that both sides of the stepladder are locked into place. Ask someone to hold it for you, if possible. Clearly mark and make sure that you can see: Any grab bars or handrails. First and last steps. Where the edge of each step is. Use tools that help you move around (mobility aids) if they are needed. These include: Canes. Walkers. Scooters. Crutches. Turn on the lights when you go into a dark area. Replace any light bulbs as soon as they burn out. Set up your furniture so you have a clear path. Avoid moving your furniture around. If any of your floors are uneven, fix them. If there are any pets around you, be aware of where they are. Review your medicines with your doctor. Some medicines can make you feel dizzy. This can increase your chance of falling. Ask your doctor what other things that you can do to help prevent falls. This information is not intended to replace advice given to you by your health care provider. Make sure you discuss any questions you have with your health care provider. Document Released: 11/20/2008 Document Revised: 07/02/2015 Document Reviewed: 02/28/2014 Elsevier Interactive Patient Education  2017 [employer].

## 2022-08-29 ENCOUNTER — Ambulatory Visit (INDEPENDENT_AMBULATORY_CARE_PROVIDER_SITE_OTHER): Payer: Medicare HMO | Admitting: Family Medicine

## 2022-08-29 ENCOUNTER — Encounter: Payer: Self-pay | Admitting: Family Medicine

## 2022-08-29 VITALS — BP 130/70 | HR 56 | Temp 98.2°F | Ht 71.0 in | Wt 198.8 lb

## 2022-08-29 DIAGNOSIS — K4090 Unilateral inguinal hernia, without obstruction or gangrene, not specified as recurrent: Secondary | ICD-10-CM | POA: Diagnosis not present

## 2022-08-29 NOTE — Patient Instructions (Addendum)
I do feel a small bulge in the left lower abdomen- this is likely a hernia- discussed red flags to seek care immediately in the emergency room or call 911 (unrelenting pain and unable to reduce hernia that is protruding) but with that being said it is very small and doubt that will happen. Offered surgical referral or their opinion but he has a lot on his plate right now with mourning process and we opted to hold off for now- he can call at anytime if changes his mind. We also discussed if gets bigger or more frequently painful that we can refer.    Recommended follow up: Return for next already scheduled visit or sooner if needed.

## 2022-08-29 NOTE — Progress Notes (Signed)
Phone [phone] In person visit   Subjective:   Robert David is a 72 y.o. year old very pleasant male patient who presents for/with See problem oriented charting Chief Complaint  Patient presents with   Hernia    Pt c/o left inguinal pain that he has seen you about before.    Past Medical History-  Patient Active Problem List   Diagnosis Date Noted   Atrial fibrillation (HCC) 05/26/2009    Priority: High   Aortic atherosclerosis (HCC) 11/07/2019    Priority: Medium    Essential hypertension 03/25/2019    Priority: Medium    Vitamin D deficiency 01/25/2018    Priority: Medium    BPH associated with nocturia 06/22/2017    Priority: Medium    Hyperglycemia 09/01/2014    Priority: Medium    Insomnia 02/24/2014    Priority: Medium    Diastolic dysfunction 05/26/2009    Priority: Medium    Hyperlipidemia 05/11/2007    Priority: Medium    Contact dermatitis 09/01/2014    Priority: Low   Former smoker 02/24/2014    Priority: Low   Chest pain 07/18/2012    Priority: Low   GERD (gastroesophageal reflux disease) 07/18/2012    Priority: Low   Prepatellar bursitis, right knee 06/22/2017    Priority: 1.   Displaced bimalleolar fracture of right lower leg, subsequent encounter for closed fracture with delayed healing 02/12/2016    Priority: 1.    Medications- reviewed and updated Current Outpatient Medications  Medication Sig Dispense Refill   ABRYSVO 120 MCG/0.5ML injection      apixaban (ELIQUIS) 5 MG TABS tablet TAKE 1 TABLET(5 MG) BY MOUTH TWICE DAILY 60 tablet 5   diltiazem (CARDIZEM CD) 120 MG 24 hr capsule Take 1 capsule (120 mg total) by mouth daily. 30 capsule 5   NON FORMULARY Cognium complete daily - Brain function     rosuvastatin (CRESTOR) 10 MG tablet Take 1 tablet (10 mg total) by mouth daily. 90 tablet 3   tamsulosin (FLOMAX) 0.4 MG CAPS capsule TAKE 2 CAPSULES(0.8 MG) BY MOUTH DAILY 60 capsule 5   No current facility-administered medications for this  visit.     Objective:  BP 130/70   Pulse (!) 56   Temp 98.2 F (36.8 C)   Ht 5\' 11"  (1.803 m)   Wt 198 lb 12.8 oz (90.2 kg)   SpO2 97%   BMI 27.73 kg/m  Gen: NAD, resting comfortably- tearful when discussing loss of wife (appropriate) Abdomen: soft/nontender except for in LLQ or upper left groin there is a slight bulge with mild pain/nondistended/normal bowel sounds. No rebound or guarding.      Assessment and Plan  #Social update-air embolism killed his wife during biopsy- multiple strokes June 2024. Very sudden. He would like to hold off on medications but admits this has been a very very hard time for him- understandably so   # Left inguinal hernia S:notes pain in left groin. Was lifting a bag of mulch 4-5 weeks ago and felt pretty intense pain that was sharp.  No pain with bowel movement or typically with straining other than certain times - random/intermittent. Has been about a year of issues. Did have CT 11/06/19 without hernia noted.  A/P: I do feel a small bulge in the left lower abdomen/more to groin- this is likely a hernia- discussed red flags to seek care immediately in the emergency room or call 911 (unrelenting pain and unable to reduce hernia that is protruding) but  with that being said it is very small and doubt that will happen. Offered surgical referral or their opinion but he has a lot on his plate right now with mourning process and we opted to hold off for now- he can call at anytime if changes his mind. We also discussed if gets bigger or more frequently painful that we can refer.    Recommended follow up: Return for next already scheduled visit or sooner if needed. Future Appointments  Date Time Provider Department Center  01/23/2023 10:00 AM Shelva Majestic, MD [facility]  08/28/2023  8:45 AM [facility] ANNUAL WELLNESS VISIT 1 [facility]    Lab/Order associations: No diagnosis found.   No orders of the defined types were placed in this  encounter.   Return precautions advised.  Tana Conch, MD

## 2022-09-12 ENCOUNTER — Telehealth: Payer: Self-pay | Admitting: Internal Medicine

## 2022-09-12 NOTE — Telephone Encounter (Signed)
Pt came to drop off old samples from his wife for [facility].

## 2022-09-13 ENCOUNTER — Other Ambulatory Visit: Payer: Self-pay | Admitting: Family Medicine

## 2022-09-14 ENCOUNTER — Ambulatory Visit: Payer: Medicare HMO | Admitting: Family Medicine

## 2022-10-07 NOTE — Telephone Encounter (Signed)
Pls give those donor samples to our Rx team of [place]. Copied  Thanks  Dr Elvera Lennox

## 2022-11-14 DIAGNOSIS — H40013 Open angle with borderline findings, low risk, bilateral: Secondary | ICD-10-CM | POA: Diagnosis not present

## 2023-01-09 DIAGNOSIS — L821 Other seborrheic keratosis: Secondary | ICD-10-CM | POA: Diagnosis not present

## 2023-01-23 ENCOUNTER — Encounter: Payer: Self-pay | Admitting: Family Medicine

## 2023-01-23 ENCOUNTER — Ambulatory Visit (INDEPENDENT_AMBULATORY_CARE_PROVIDER_SITE_OTHER): Payer: Medicare HMO | Admitting: Family Medicine

## 2023-01-23 VITALS — BP 138/70 | HR 79 | Temp 98.1°F | Ht 71.0 in | Wt 189.0 lb

## 2023-01-23 DIAGNOSIS — E559 Vitamin D deficiency, unspecified: Secondary | ICD-10-CM

## 2023-01-23 DIAGNOSIS — Z0000 Encounter for general adult medical examination without abnormal findings: Secondary | ICD-10-CM

## 2023-01-23 DIAGNOSIS — I10 Essential (primary) hypertension: Secondary | ICD-10-CM | POA: Diagnosis not present

## 2023-01-23 DIAGNOSIS — R739 Hyperglycemia, unspecified: Secondary | ICD-10-CM | POA: Diagnosis not present

## 2023-01-23 DIAGNOSIS — Z125 Encounter for screening for malignant neoplasm of prostate: Secondary | ICD-10-CM

## 2023-01-23 DIAGNOSIS — Z131 Encounter for screening for diabetes mellitus: Secondary | ICD-10-CM | POA: Diagnosis not present

## 2023-01-23 DIAGNOSIS — E785 Hyperlipidemia, unspecified: Secondary | ICD-10-CM

## 2023-01-23 LAB — CBC WITH DIFFERENTIAL/PLATELET
Basophils Absolute: 0 10*3/uL (ref 0.0–0.1)
Basophils Relative: 0.5 % (ref 0.0–3.0)
Eosinophils Absolute: 0.2 10*3/uL (ref 0.0–0.7)
Eosinophils Relative: 3 % (ref 0.0–5.0)
HCT: 46.3 % (ref 39.0–52.0)
Hemoglobin: 15.2 g/dL (ref 13.0–17.0)
Lymphocytes Relative: 28.8 % (ref 12.0–46.0)
Lymphs Abs: 1.6 10*3/uL (ref 0.7–4.0)
MCHC: 32.8 g/dL (ref 30.0–36.0)
MCV: 90 fL (ref 78.0–100.0)
Monocytes Absolute: 0.5 10*3/uL (ref 0.1–1.0)
Monocytes Relative: 8.9 % (ref 3.0–12.0)
Neutro Abs: 3.2 10*3/uL (ref 1.4–7.7)
Neutrophils Relative %: 58.8 % (ref 43.0–77.0)
Platelets: 223 10*3/uL (ref 150.0–400.0)
RBC: 5.14 Mil/uL (ref 4.22–5.81)
RDW: 13.7 % (ref 11.5–15.5)
WBC: 5.4 10*3/uL (ref 4.0–10.5)

## 2023-01-23 LAB — COMPREHENSIVE METABOLIC PANEL WITH GFR
ALT: 30 U/L (ref 0–53)
AST: 26 U/L (ref 0–37)
Albumin: 4.8 g/dL (ref 3.5–5.2)
Alkaline Phosphatase: 53 U/L (ref 39–117)
BUN: 17 mg/dL (ref 6–23)
CO2: 26 meq/L (ref 19–32)
Calcium: 9.5 mg/dL (ref 8.4–10.5)
Chloride: 105 meq/L (ref 96–112)
Creatinine, Ser: 1.02 mg/dL (ref 0.40–1.50)
GFR: 72.6 mL/min
Glucose, Bld: 103 mg/dL — ABNORMAL HIGH (ref 70–99)
Potassium: 4.1 meq/L (ref 3.5–5.1)
Sodium: 141 meq/L (ref 135–145)
Total Bilirubin: 0.7 mg/dL (ref 0.2–1.2)
Total Protein: 7 g/dL (ref 6.0–8.3)

## 2023-01-23 LAB — HEMOGLOBIN A1C: Hgb A1c MFr Bld: 6 % (ref 4.6–6.5)

## 2023-01-23 LAB — LIPID PANEL
Cholesterol: 123 mg/dL (ref 0–200)
HDL: 53.7 mg/dL (ref >39.00)
LDL Cholesterol: 58 mg/dL (ref 0–99)
NonHDL: 69.2
Total CHOL/HDL Ratio: 2
Triglycerides: 56 mg/dL (ref 0.0–149.0)
VLDL: 11.2 mg/dL (ref 0.0–40.0)

## 2023-01-23 LAB — PSA, MEDICARE: PSA: 2.14 ng/mL (ref 0.10–4.00)

## 2023-01-23 LAB — VITAMIN D 25 HYDROXY (VIT D DEFICIENCY, FRACTURES): VITD: 33.22 ng/mL (ref 30.00–100.00)

## 2023-01-23 NOTE — Patient Instructions (Addendum)
Let us know if you get shingrix, COVID, or Prevnar 20   Right ear irrigation today  Please stop by lab before you go If you have mychart- we will send your results within 3 business days of [place] receiving them.  If you do not have mychart- we will call you about results within 5 business days of [place] receiving them.  *please also note that you will see labs on mychart as soon as they post. I will later go in and write notes on them- will say "notes from Dr. Durene Cal"   Recommended follow up: Return in about 1 year (around 01/23/2024) for physical or sooner if needed.Schedule b4 you leave.

## 2023-01-23 NOTE — Progress Notes (Signed)
Phone: [phone]   Subjective:  Patient presents today for their annual physical. Chief complaint-noted.   See problem oriented charting- ROS- full  review of systems was completed and negative  Per full ROS sheet completed by patient other than some insomnia, mild pressure in head at times- worse with higher blood pressure - not the same as headache(s) we did imaging for 09/23/19  The following were reviewed and entered/updated in epic: Past Medical History:  Diagnosis Date   Headache(784.0)    Hyperlipidemia    Persistent atrial fibrillation (HCC)    Seasonal allergies    Patient Active Problem List   Diagnosis Date Noted   Atrial fibrillation (HCC) 05/26/2009    Priority: High   Aortic atherosclerosis (HCC) 11/07/2019    Priority: Medium    Essential hypertension 03/25/2019    Priority: Medium    Vitamin D deficiency 01/25/2018    Priority: Medium    BPH associated with nocturia 06/22/2017    Priority: Medium    Hyperglycemia 09/01/2014    Priority: Medium    Insomnia 02/24/2014    Priority: Medium    Diastolic dysfunction 05/26/2009    Priority: Medium    Hyperlipidemia 05/11/2007    Priority: Medium    Contact dermatitis 09/01/2014    Priority: Low   Former smoker 02/24/2014    Priority: Low   Chest pain 07/18/2012    Priority: Low   GERD (gastroesophageal reflux disease) 07/18/2012    Priority: Low   Prepatellar bursitis, right knee 06/22/2017    Priority: 1.   Displaced bimalleolar fracture of right lower leg, subsequent encounter for closed fracture with delayed healing 02/12/2016    Priority: 1.   Left groin hernia 08/29/2022   Past Surgical History:  Procedure Laterality Date   CARDIOVERSION N/A 05/12/2017   Procedure: CARDIOVERSION;  Surgeon: Pricilla Riffle, MD;  Location: [facility] ENDOSCOPY;  Service: Cardiovascular;  Laterality: N/A;   COLONOSCOPY     FRACTURE SURGERY  Right ankle   LEFT HEART CATHETERIZATION WITH CORONARY ANGIOGRAM N/A 07/19/2012    Procedure: LEFT HEART CATHETERIZATION WITH CORONARY ANGIOGRAM;  Surgeon: [name], MD;  Location: [facility] CATH LAB;  Service: Cardiovascular;  Laterality: N/A;   ORIF ANKLE FRACTURE Right 02/17/2016   Procedure: OPEN REDUCTION INTERNAL FIXATION (ORIF) BIMALLEOLAR ANKLE FRACTURE;  Surgeon: Tarry Kos, MD;  Location: [facility];  Service: Orthopedics;  Laterality: Right;   POLYPECTOMY      Family History  Problem Relation Age of Onset   Cancer Mother    Breast cancer Mother    Heart disease Father        hx A-Fib, 13 with MI   Colon cancer Father 38       metastatic to lung   Rectal cancer Neg Hx    Stomach cancer Neg Hx     Medications- reviewed and updated Current Outpatient Medications  Medication Sig Dispense Refill   apixaban (ELIQUIS) 5 MG TABS tablet TAKE 1 TABLET(5 MG) BY MOUTH TWICE DAILY 60 tablet 5   diltiazem (CARDIZEM CD) 120 MG 24 hr capsule TAKE 1 CAPSULE(120 MG) BY MOUTH DAILY 30 capsule 5   NON FORMULARY Cognium complete daily - Brain function     rosuvastatin (CRESTOR) 10 MG tablet Take 1 tablet (10 mg total) by mouth daily. 90 tablet 3   tamsulosin (FLOMAX) 0.4 MG CAPS capsule TAKE 2 CAPSULES(0.8 MG) BY MOUTH DAILY 60 capsule 5   No current facility-administered medications for this visit.  Allergies-reviewed and updated No Known Allergies  Social History   Social History Narrative   Married (wife Dondra Spry (bobby gail) also Dr. Durene Cal patient) [phone]. 3 step children (2 living). 3 stepgrandchildren.       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       Hobbies: bowling, golf, exercise    Objective  Objective:  BP 138/70   Pulse 79   Temp 98.1 F (36.7 C)   Ht 5\' 11"  (1.803 m)   Wt 189 lb (85.7 kg)   SpO2 98%   BMI 26.36 kg/m  Gen: NAD, resting comfortably HEENT: Mucous membranes are moist. Oropharynx normal. Left tympanic membrane normal, right tympanic membrane obstructed partially by cerumen- team to irrigateg Neck: no  thyromegaly CV: RRR no murmurs rubs or gallops Lungs: CTAB no crackles, wheeze, rhonchi Abdomen: soft/nontender/nondistended/normal bowel sounds. No rebound or guarding.  Ext: no edema Skin: warm, dry Neuro: grossly normal, moves all extremities, PERRLA    Assessment and Plan  72 y.o. male presenting for annual physical.  Health Maintenance counseling: 1. Anticipatory guidance: Patient counseled regarding regular dental exams -q6 months planned- getting reestablished, eye exams -yearly,  avoiding smoking and second hand smoke , limiting alcohol to 2 beverages per day - none, no illicit drugs .   2. Risk factor reduction:  Advised patient of need for regular exercise and diet rich and fruits and vegetables to reduce risk of heart attack and stroke.  Exercise- still going wed and Friday to the [facility] for most part in the mornings .  Diet/weight management-Down 3 pounds from last year- he has been intentional but also part of it is his schedule- really 2 meals a day.  Wt Readings from Last 3 Encounters:  01/23/23 189 lb (85.7 kg)  08/29/22 198 lb 12.8 oz (90.2 kg)  08/22/22 203 lb 6.4 oz (92.3 kg)  3. Immunizations/screenings/ancillary studies-Prevnar 20- opts out , Shingrix still considering, COVID holding off for now   Immunization History  Administered Date(s) Administered   Influenza Split 10/20/2011   Influenza Whole 11/26/2008   Influenza, High Dose Seasonal PF 01/09/2015, 03/14/2016, 11/14/2017, 10/08/2018, 12/21/2020, 11/21/2021   Influenza,inj,Quad PF,6+ Mos 01/21/2013   Influenza-Unspecified 01/21/2014, 01/05/2017, 11/14/2017, 12/30/2019   Moderna SARS-COV2 Booster Vaccination 02/13/2020, 06/29/2020   Moderna Sars-Covid-2 Vaccination 05/01/2019, 05/29/2019   Pneumococcal Conjugate-13 03/09/2015   Pneumococcal-Unspecified 01/21/2014   Respiratory Syncytial Virus Vaccine,Recomb Aduvanted(Arexvy) 02/25/2022   Td 06/04/2008   Tdap 01/10/2019   Zoster, Live 10/11/2011  4. Prostate  cancer screening-  past age based screening recommendations but he prefers to continue to check PSA's.  Still with nocturia 1-2 times a night and Flomax helpful but some depends on water drinking schedule Lab Results  Component Value Date   PSA 1.39 01/19/2022   PSA 1.15 01/18/2021   PSA 1.25 01/14/2020   5. Colon cancer screening - Dr. Tomasa Rand 11/11/21- 3 polyps precancerous but no high risk features- "Given your history of small and low risk polyps, I think your risk of colon cancer is very low and would recommend against further colon cancer screening.  6. Skin cancer screening-follows with Dr. Orvan Falconer- may next visit.  advised regular sunscreen use. Denies worrisome, changing, or new skin lesions.  7. Smoking associated screening (lung cancer screening, AAA screen 65-75, UA)- Former smoker-quit smoking 30 years ago.  Have discussed AAA screening in past but he now had CT abd/pelvis on 11/06/19 with no aneurysm- no further follow up needed    8.  STD screening -widowed/not active   Status of chronic or acute concerns   # Social update-holiday season very challenging after loss of wife in June and lost his son in April 2021  # Permanent atrial fibrillation-left atrium dilation S: Rate controlled with diltiazem 120 mg ER Anticoagulated with Eliquis 5 mg twice daily (through [place]) Chadsvasc score of 2 in 2020 Patient is  followed by cardiology: Dr. Diona Browner A/P:  appropriately anticoagulated and rate controlled- continue current medicine - has space to take 2nd dose for blood pressure if needed   #hypertension S: medication: Has been controlled without medication other than diltiazem most recently for A-fib- sparingly has taken second dose -yesterday blood pressure up to 170 - stressful day overall but once things settled back down to 118 or so. Wonders if dehydration played a role- usually 80 oz a day but was running behind BP Readings from Last 3 Encounters:  01/23/23 138/70  08/29/22  130/70  08/22/22 138/82  A/P: High acceptable reading today-continue current medications- high yesterday and he wants to have flexibility to take 2nd dose if needed if elevated- I think his heart rate could tolerate this-    #hyperlipidemia with aortic atherosclerosis S: Medication:rosuvastatin 10 mg -CT calcium scoring of 446 which is 66 percentile in January 2024  Lab Results  Component Value Date   CHOL 173 01/19/2022   HDL 46.50 01/19/2022   LDLCALC 109 (H) 01/19/2022   LDLDIRECT 43.0 03/30/2022   TRIG 89.0 01/19/2022   CHOLHDL 4 01/19/2022  A/P: Hoping for improvement-with level of scoring could refer to cardiology and offered today     # Hyperglycemia/insulin resistance/prediabetes-A1c as high as 5.9 in 2021 S:  Medication: None. Feels could come back on sweets- some stress snacking Lab Results  Component Value Date   HGBA1C 5.9 01/19/2022   HGBA1C 5.8 01/18/2021   HGBA1C 5.7 (H) 01/14/2020  A/P: Possible this is trending up on statin-update level today   #Vitamin D deficiency S: Medication: 1000 units he thinks Last vitamin D Lab Results  Component Value Date   VD25OH 24.88 (L) 01/19/2022  A/P: Levels were low last year-hopefully improved- if not consider high dose  #BPH- on flomax 0.8mg -symptoms overall stable  #inguinal hernia left- below belt line and offered referral 08/29/22 but declines- bothers him at times- still wants to hold off. Avoid heavy lifting  #insomnia- takes sleep 3 with some relief.  -he wants to hold off on adding medications but we discussed possible trazodone- he can reach out if changes his mind  #head pressure- not like headache(s) he had in 2021- feels like related to dehydration and stress- if not better he will reach out to [place] and we can order MRI brain again like 2021 since this feels different ROS- No facial or extremity weakness. No slurred words or trouble swallowing. no blurry vision or double vision. No paresthesias. No confusion or  word finding difficulties.   Recommended follow up: Return in about 1 year (around 01/23/2024) for physical or sooner if needed.Schedule b4 you leave. Future Appointments  Date Time Provider Department Center  08/28/2023  8:45 AM [facility] ANNUAL WELLNESS VISIT 1 [facility]   Lab/Order associations: fasting   ICD-10-CM   1. Preventative health care  Z00.00     2. Essential hypertension  I10     3. Hyperlipidemia, unspecified hyperlipidemia type  E78.5 Comprehensive metabolic panel    CBC with Differential/Platelet    Lipid panel    4. Hyperglycemia  R73.9 Hemoglobin A1c  5. Screening for diabetes mellitus  Z13.1 Hemoglobin A1c    6. Vitamin D deficiency  E55.9 VITAMIN D 25 Hydroxy (Vit-D Deficiency, Fractures)    7. Screening for prostate cancer  Z12.5 PSA, Medicare      No orders of the defined types were placed in this encounter.   Return precautions advised.  Tana Conch, MD

## 2023-01-24 ENCOUNTER — Other Ambulatory Visit: Payer: Self-pay

## 2023-01-24 DIAGNOSIS — R972 Elevated prostate specific antigen [PSA]: Secondary | ICD-10-CM

## 2023-01-27 ENCOUNTER — Encounter: Payer: Self-pay | Admitting: Family Medicine

## 2023-01-30 ENCOUNTER — Other Ambulatory Visit: Payer: Self-pay

## 2023-01-30 MED ORDER — TAMSULOSIN HCL 0.4 MG PO CAPS: 0.4000 mg | ORAL_CAPSULE | Freq: Every day | ORAL | 0 refills | Status: DC

## 2023-02-09 ENCOUNTER — Encounter: Payer: Self-pay | Admitting: Family Medicine

## 2023-02-09 ENCOUNTER — Other Ambulatory Visit: Payer: Self-pay

## 2023-02-09 MED ORDER — ROSUVASTATIN CALCIUM 10 MG PO TABS: 10.0000 mg | ORAL_TABLET | Freq: Every day | ORAL | 3 refills | Status: DC

## 2023-03-06 ENCOUNTER — Other Ambulatory Visit: Payer: HMO

## 2023-03-06 ENCOUNTER — Encounter: Payer: Self-pay | Admitting: Family Medicine

## 2023-03-06 DIAGNOSIS — R972 Elevated prostate specific antigen [PSA]: Secondary | ICD-10-CM

## 2023-03-06 LAB — PSA, MEDICARE: PSA: 1.39 ng/mL (ref 0.10–4.00)

## 2023-03-17 ENCOUNTER — Encounter: Payer: Self-pay | Admitting: Family Medicine

## 2023-03-17 ENCOUNTER — Other Ambulatory Visit: Payer: Self-pay

## 2023-03-17 MED ORDER — DILTIAZEM HCL ER COATED BEADS 120 MG PO CP24: 120.0000 mg | ORAL_CAPSULE | Freq: Every day | ORAL | 3 refills | Status: DC

## 2023-03-27 ENCOUNTER — Other Ambulatory Visit: Payer: Self-pay | Admitting: Family Medicine

## 2023-03-31 ENCOUNTER — Ambulatory Visit: Payer: HMO | Attending: Cardiology | Admitting: Cardiology

## 2023-03-31 ENCOUNTER — Encounter: Payer: Self-pay | Admitting: Cardiology

## 2023-03-31 VITALS — BP 142/82 | HR 73 | Ht 71.0 in | Wt 201.8 lb

## 2023-03-31 DIAGNOSIS — I10 Essential (primary) hypertension: Secondary | ICD-10-CM | POA: Diagnosis not present

## 2023-03-31 DIAGNOSIS — I4821 Permanent atrial fibrillation: Secondary | ICD-10-CM

## 2023-03-31 DIAGNOSIS — R931 Abnormal findings on diagnostic imaging of heart and coronary circulation: Secondary | ICD-10-CM | POA: Diagnosis not present

## 2023-03-31 NOTE — Patient Instructions (Signed)

## 2023-03-31 NOTE — Progress Notes (Signed)
    Cardiology Office Note  Date: 03/31/2023   ID: Robert David, DOB [birth year], MRN [id number]  History of Present Illness: Robert David is a 73 y.o. male last seen in October 2023.  He is here for a follow-up visit.  Overall doing reasonably well, his wife did pass away this past summer however.  He does have other support from family and church.  He reports no exertional chest pain or unusual shortness of breath, rarely is aware of his atrial fibrillation.  We reviewed his medications today.  He does not report any spontaneous bleeding problems on Eliquis.  Blood pressure was elevated, rechecked by me at 142/82 in the left arm.  I asked him to keep an eye on this at home, he has gained some weight over the last 6 months.  I reviewed his ECG today which shows rate controlled atrial fibrillation with PVC.  Physical Exam: VS:  BP (!) 142/82   Pulse 73   Ht 5\' 11"  (1.803 m)   Wt 201 lb 12.8 oz (91.5 kg)   SpO2 97%   BMI 28.15 kg/m , BMI Body mass index is 28.15 kg/m.  Wt Readings from Last 3 Encounters:  03/31/23 201 lb 12.8 oz (91.5 kg)  01/23/23 189 lb (85.7 kg)  08/29/22 198 lb 12.8 oz (90.2 kg)    General: Patient appears comfortable at rest. HEENT: Conjunctiva and lids normal. Neck: Supple, no elevated JVP or carotid bruits. Lungs: Clear to auscultation, nonlabored breathing at rest. Cardiac: Irregular irregular, no S3 or significant murmur. Extremities: No pitting edema.  ECG:  An ECG dated 11/17/2021 was personally reviewed today and demonstrated:  Atrial fibrillation at 60 bpm.  Labwork: 01/23/2023: ALT 30; AST 26; BUN 17; Creatinine, Ser 1.02; Hemoglobin 15.2; Platelets 223.0; Potassium 4.1; Sodium 141     Component Value Date/Time   CHOL 123 01/23/2023 1043   TRIG 56.0 01/23/2023 1043   HDL 53.70 01/23/2023 1043   CHOLHDL 2 01/23/2023 1043   VLDL 11.2 01/23/2023 1043   LDLCALC 58 01/23/2023 1043   LDLDIRECT 43.0 03/30/2022 1145   Other Studies Reviewed  Today:  No interval cardiac testing for review today.  Assessment and Plan:  1.  Permanent atrial fibrillation with CHA2DS2-VASc score of 2.  On Eliquis 5 mg twice daily for stroke prophylaxis along with Cardizem CD 120 mg daily.  Overall symptomatically stable.  I reviewed his lab work which is outlined above.   2.  Primary hypertension.  Blood pressure elevated today.  I asked him to keep an eye on this at home.  We also discussed weight loss, sodium restriction.  May need to ultimately consider addition of another agent, diuretic or ARB would be reasonable.  3.  Coronary calcium score of 446 by CT in January 2024.  He is asymptomatic.  We did discuss warning signs and symptoms that would prompt further evaluation.  LDL 58 in December 2024.  He is currently on Crestor 10 mg daily.  Disposition:  Follow up  1 year, sooner if needed.  Signed, Jonelle Sidle, M.D., F.A.C.C. [facility] at [facility]

## 2023-04-13 ENCOUNTER — Other Ambulatory Visit: Payer: Self-pay | Admitting: Family Medicine

## 2023-06-28 DIAGNOSIS — I781 Nevus, non-neoplastic: Secondary | ICD-10-CM | POA: Diagnosis not present

## 2023-06-28 DIAGNOSIS — D1801 Hemangioma of skin and subcutaneous tissue: Secondary | ICD-10-CM | POA: Diagnosis not present

## 2023-06-28 DIAGNOSIS — L821 Other seborrheic keratosis: Secondary | ICD-10-CM | POA: Diagnosis not present

## 2023-06-28 DIAGNOSIS — L814 Other melanin hyperpigmentation: Secondary | ICD-10-CM | POA: Diagnosis not present

## 2023-08-20 ENCOUNTER — Encounter: Payer: Self-pay | Admitting: Family Medicine

## 2023-08-21 ENCOUNTER — Other Ambulatory Visit: Payer: Self-pay

## 2023-08-21 DIAGNOSIS — K4090 Unilateral inguinal hernia, without obstruction or gangrene, not specified as recurrent: Secondary | ICD-10-CM

## 2023-08-28 ENCOUNTER — Ambulatory Visit (INDEPENDENT_AMBULATORY_CARE_PROVIDER_SITE_OTHER): Payer: Medicare HMO

## 2023-08-28 VITALS — BP 138/76 | HR 78 | Temp 98.2°F | Ht 70.0 in | Wt 205.8 lb

## 2023-08-28 DIAGNOSIS — Z0000 Encounter for general adult medical examination without abnormal findings: Secondary | ICD-10-CM

## 2023-08-28 NOTE — Patient Instructions (Signed)
 Robert David , Thank you for taking time out of your busy schedule to complete your Annual Wellness Visit with me. I enjoyed our conversation and look forward to speaking with you again next year. I, as well as your care team,  appreciate your ongoing commitment to your health goals. Please review the following plan we discussed and let me know if I can assist you in the future. Your Game plan/ To Do List    Referrals: If you haven't heard from the office you've been referred to, please reach out to them at the phone provided.   Follow up Visits: Next Medicare AWV with our clinical staff: 09/02/24   Have you seen your provider in the last 6 months (3 months if uncontrolled diabetes)? No Next Office Visit with your provider: 01/29/24  Clinician Recommendations:  Aim for 30 minutes of exercise or brisk walking, 6-8 glasses of water, and 5 servings of fruits and vegetables each day.       This is a list of the screening recommended for you and due dates:  Health Maintenance  Topic Date Due   Zoster (Shingles) Vaccine (1 of 2) 12/13/1998   Pneumococcal Vaccine for age over 67 (2 of 2 - PCV20 or PCV21) 03/08/2016   COVID-19 Vaccine (5 - 2024-25 season) 10/09/2022   Medicare Annual Wellness Visit  08/22/2023   Flu Shot  09/08/2023   Colon Cancer Screening  11/06/2026   DTaP/Tdap/Td vaccine (3 - Td or Tdap) 01/09/2029   Hepatitis C Screening  Addressed   Hepatitis B Vaccine  Aged Out   HPV Vaccine  Aged Out   Meningitis B Vaccine  Aged Out    Advanced directives: (Declined) Advance directive discussed with you today. Even though you declined this today, please call our office should you change your mind, and we can give you the proper paperwork for you to fill out. Advance Care Planning is important because it:  [x]  Makes sure you receive the medical care that is consistent with your values, goals, and preferences  [x]  It provides guidance to your family and loved ones and reduces their  decisional burden about whether or not they are making the right decisions based on your wishes.  Follow the link provided in your after visit summary or read over the paperwork we have mailed to you to help you started getting your Advance Directives in place. If you need assistance in completing these, please reach out to us  so that we can help you!  See attachments for Preventive Care and Fall Prevention Tips.

## 2023-08-28 NOTE — Progress Notes (Signed)
 Subjective:   Robert David is a 73 y.o. who presents for a Medicare Wellness preventive visit.  As a reminder, Annual Wellness Visits don't include a physical exam, and some assessments may be limited, especially if this visit is performed virtually. We may recommend an in-person follow-up visit with your provider if needed.  Visit Complete: In person    Persons Participating in Visit: Patient.  AWV Questionnaire: Yes: Patient Medicare AWV questionnaire was completed by the patient on 08/21/23; I have confirmed that all information answered by patient is correct and no changes since this date.  Cardiac Risk Factors include: advanced age (>29men, >71 women);dyslipidemia;hypertension;male gender     Objective:    Today's Vitals   08/28/23 0838  BP: (!) 142/80  Pulse: 78  Temp: 98.2 F (36.8 C)  SpO2: 96%  Weight: 205 lb 12.8 oz (93.4 kg)  Height: 5' 10 (1.778 m)   Body mass index is 29.53 kg/m.     08/28/2023    8:45 AM 05/12/2017    9:50 AM 02/17/2016    7:09 AM 02/12/2016    4:14 PM 04/29/2014    8:43 AM 07/19/2012    6:00 AM  Advanced Directives  Does Patient Have a Medical Advance Directive? No No  No  No  No  Patient does not have advance directive   Would patient like information on creating a medical advance directive? No - Patient declined No - Patient declined       Pre-existing out of facility DNR order (yellow form or pink MOST form)      No      Data saved with a previous flowsheet row definition    Current Medications (verified) Outpatient Encounter Medications as of 08/28/2023  Medication Sig   apixaban  (ELIQUIS ) 5 MG TABS tablet TAKE 1 TABLET(5 MG) BY MOUTH TWICE DAILY   diltiazem  (CARDIZEM  CD) 120 MG 24 hr capsule TAKE 1 CAPSULE(120 MG) BY MOUTH DAILY   NON FORMULARY Cognium complete daily - Brain function   rosuvastatin  (CRESTOR ) 10 MG tablet Take 1 tablet (10 mg total) by mouth daily.   tamsulosin  (FLOMAX ) 0.4 MG CAPS capsule TAKE 1 CAPSULE(0.4 MG) BY  MOUTH DAILY   No facility-administered encounter medications on file as of 08/28/2023.    Allergies (verified) Patient has no known allergies.   History: Past Medical History:  Diagnosis Date   Headache(784.0)    Hyperlipidemia    Persistent atrial fibrillation (HCC)    Seasonal allergies    Past Surgical History:  Procedure Laterality Date   CARDIOVERSION N/A 05/12/2017   Procedure: CARDIOVERSION;  Surgeon: Okey Vina GAILS, MD;  Location: [facility] ENDOSCOPY;  Service: Cardiovascular;  Laterality: N/A;   COLONOSCOPY     FRACTURE SURGERY  Right ankle   LEFT HEART CATHETERIZATION WITH CORONARY ANGIOGRAM N/A 07/19/2012   Procedure: LEFT HEART CATHETERIZATION WITH CORONARY ANGIOGRAM;  Surgeon: Lynwood Schilling, MD;  Location: [facility] CATH LAB;  Service: Cardiovascular;  Laterality: N/A;   ORIF ANKLE FRACTURE Right 02/17/2016   Procedure: OPEN REDUCTION INTERNAL FIXATION (ORIF) BIMALLEOLAR ANKLE FRACTURE;  Surgeon: Kay CHRISTELLA Cummins, MD;  Location: [facility];  Service: Orthopedics;  Laterality: Right;   POLYPECTOMY     Family History  Problem Relation Age of Onset   Cancer Mother    Breast cancer Mother    Heart disease Father        hx A-Fib, 41 with MI   Colon cancer Father 82       metastatic  to lung   Rectal cancer Neg Hx    Stomach cancer Neg Hx    Social History   Socioeconomic History   Marital status: Widowed    Spouse name: Not on file   Number of children: Not on file   Years of education: Not on file   Highest education level: Some college, no degree  Occupational History   Not on file  Tobacco Use   Smoking status: Former    Current packs/day: 0.00    Average packs/day: 1 pack/day for 10.0 years (10.0 ttl pk-yrs)    Types: Cigarettes    Start date: 10/25/1978    Quit date: 10/24/1988    Years since quitting: 34.8   Smokeless tobacco: Never  Vaping Use   Vaping status: Never Used  Substance and Sexual Activity   Alcohol use: No    Alcohol/week: 0.0  standard drinks of alcohol   Drug use: No   Sexual activity: Not on file  Other Topics Concern   Not on file  Social History Narrative   Married (wife Alisa (bobby gail) also Dr. Katrinka patient) 25. 3 step children (2 living). 3 stepgrandchildren.       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       Hobbies: bowling, golf, exercise    Social Drivers of Health   Financial Resource Strain: Low Risk  (08/21/2023)   Overall Financial Resource Strain (CARDIA)    Difficulty of Paying Living Expenses: Not hard at all  Food Insecurity: No Food Insecurity (08/21/2023)   Hunger Vital Sign    Worried About Running Out of Food in the Last Year: Never true    Ran Out of Food in the Last Year: Never true  Transportation Needs: No Transportation Needs (08/21/2023)   PRAPARE - Administrator, Civil Service (Medical): No    Lack of Transportation (Non-Medical): No  Physical Activity: Sufficiently Active (08/21/2023)   Exercise Vital Sign    Days of Exercise per Week: 7 days    Minutes of Exercise per Session: 60 min  Stress: No Stress Concern Present (08/21/2023)   [employer] of Occupational Health - Occupational Stress Questionnaire    Feeling of Stress: Only a little  Social Connections: Moderately Integrated (08/21/2023)   Social Connection and Isolation Panel    Frequency of Communication with Friends and Family: More than three times a week    Frequency of Social Gatherings with Friends and Family: Three times a week    Attends Religious Services: More than 4 times per year    Active Member of Clubs or Organizations: Yes    Attends Banker Meetings: More than 4 times per year    Marital Status: Widowed    Tobacco Counseling Counseling given: Not Answered    Clinical Intake:  Pre-visit preparation completed: Yes  Pain : No/denies pain     BMI - recorded: 29.53 Nutritional Status: BMI 25 -29 Overweight Nutritional Risks: None Diabetes:  No  Lab Results  Component Value Date   HGBA1C 6.0 01/23/2023   HGBA1C 5.9 01/19/2022   HGBA1C 5.8 01/18/2021     How often do you need to have someone help you when you read instructions, pamphlets, or other written materials from your doctor or pharmacy?: 1 - Never  Interpreter Needed?: No  Information entered by :: Ellouise Haws, LPN   Activities of Daily Living     08/21/2023   11:09 AM  In your present  state of health, do you have any difficulty performing the following activities:  Hearing? 0  Vision? 0  Difficulty concentrating or making decisions? 0  Walking or climbing stairs? 0  Dressing or bathing? 0  Doing errands, shopping? 0  Preparing Food and eating ? N  Using the Toilet? N  In the past six months, have you accidently leaked urine? N  Do you have problems with loss of bowel control? N  Managing your Medications? N  Managing your Finances? N  Housekeeping or managing your Housekeeping? N    Patient Care Team: Katrinka Garnette KIDD, MD as PCP - General (Family Medicine) Debera Jayson MATSU, MD as PCP - Cardiology (Cardiology)  I have updated your Care Teams any recent Medical Services you may have received from other providers in the past year.     Assessment:   This is a routine wellness examination for [employer].  Hearing/Vision screen Hearing Screening - Comments:: Pt denies any hearing issues  Vision Screening - Comments:: Wears rx glasses - up to date with routine eye exams with [facility]     Goals Addressed             This Visit's Progress    Patient Stated       Weight loss        Depression Screen     08/28/2023    8:46 AM 01/23/2023    9:55 AM 08/29/2022   10:26 AM 08/22/2022    8:51 AM 01/19/2022    9:57 AM 01/18/2021    9:24 AM 01/14/2020    8:27 AM  PHQ 2/9 Scores  PHQ - 2 Score 0 0 0 1 0 0 0  PHQ- 9 Score  1 0  0 0     Fall Risk     08/21/2023   11:09 AM 08/29/2022   10:26 AM 08/22/2022    8:53 AM 01/19/2022    9:57  AM 01/18/2021    8:26 AM  Fall Risk   Falls in the past year? 0 0 0 0 0  Number falls in past yr: 0 0 0 0 0  Injury with Fall? 0 0 0 0 0  Risk for fall due to : No Fall Risks No Fall Risks Impaired vision No Fall Risks   Follow up Falls prevention discussed Falls evaluation completed Falls prevention discussed Falls evaluation completed       Data saved with a previous flowsheet row definition    MEDICARE RISK AT HOME:  Medicare Risk at Home Any stairs in or around the home?: (Patient-Rptd) Yes If so, are there any without handrails?: (Patient-Rptd) No Home free of loose throw rugs in walkways, pet beds, electrical cords, etc?: (Patient-Rptd) Yes Adequate lighting in your home to reduce risk of falls?: (Patient-Rptd) Yes Life alert?: (Patient-Rptd) No Use of a cane, walker or w/c?: (Patient-Rptd) No Grab bars in the bathroom?: (Patient-Rptd) No Shower chair or bench in shower?: (Patient-Rptd) No Elevated toilet seat or a handicapped toilet?: (Patient-Rptd) No  TIMED UP AND GO:  Was the test performed?  Yes  Length of time to ambulate 10 feet: 10 sec Gait steady and fast without use of assistive device  Cognitive Function: 6CIT completed        08/28/2023    8:49 AM 08/22/2022    8:54 AM  6CIT Screen  What Year? 0 points 0 points  What month? 0 points 0 points  What time? 0 points 0 points  Count back from 20  0 points 0 points  Months in reverse 0 points 0 points  Repeat phrase 0 points 0 points  Total Score 0 points 0 points    Immunizations Immunization History  Administered Date(s) Administered   Influenza Split 10/20/2011   Influenza Whole 11/26/2008   Influenza, High Dose Seasonal PF 01/09/2015, 03/14/2016, 11/14/2017, 10/08/2018, 12/21/2020, 11/21/2021   Influenza,inj,Quad PF,6+ Mos 01/21/2013   Influenza-Unspecified 01/21/2014, 01/05/2017, 11/14/2017, 12/30/2019   Moderna SARS-COV2 Booster Vaccination 02/13/2020, 06/29/2020   Moderna Sars-Covid-2  Vaccination 05/01/2019, 05/29/2019   Pneumococcal Conjugate-13 03/09/2015   Pneumococcal-Unspecified 01/21/2014   Respiratory Syncytial Virus Vaccine,Recomb Aduvanted(Arexvy) 02/25/2022   Td 06/04/2008   Tdap 01/10/2019   Zoster, Live 10/11/2011    Screening Tests Health Maintenance  Topic Date Due   Zoster Vaccines- Shingrix (1 of 2) 12/13/1998   Pneumococcal Vaccine: 50+ Years (2 of 2 - PCV20 or PCV21) 03/08/2016   COVID-19 Vaccine (5 - 2024-25 season) 10/09/2022   INFLUENZA VACCINE  09/08/2023   Medicare Annual Wellness (AWV)  08/27/2024   Colonoscopy  11/06/2026   DTaP/Tdap/Td (3 - Td or Tdap) 01/09/2029   Hepatitis C Screening  Addressed   Hepatitis B Vaccines  Aged Out   HPV VACCINES  Aged Out   Meningococcal B Vaccine  Aged Out    Health Maintenance  Health Maintenance Due  Topic Date Due   Zoster Vaccines- Shingrix (1 of 2) 12/13/1998   Pneumococcal Vaccine: 50+ Years (2 of 2 - PCV20 or PCV21) 03/08/2016   COVID-19 Vaccine (5 - 2024-25 season) 10/09/2022   Health Maintenance Items Addressed: See Nurse Notes at the end of this note  Additional Screening:  Vision Screening: Recommended annual ophthalmology exams for early detection of glaucoma and other disorders of the eye. Would you like a referral to an eye doctor? No    Dental Screening: Recommended annual dental exams for proper oral hygiene  Community Resource Referral / Chronic Care Management: CRR required this visit?  No   CCM required this visit?  No   Plan:    I have personally reviewed and noted the following in the patient's chart:   Medical and social history Use of alcohol, tobacco or illicit drugs  Current medications and supplements including opioid prescriptions. Patient is not currently taking opioid prescriptions. Functional ability and status Nutritional status Physical activity Advanced directives List of other physicians Hospitalizations, surgeries, and ER visits in previous  12 months Vitals Screenings to include cognitive, depression, and falls Referrals and appointments  In addition, I have reviewed and discussed with patient certain preventive protocols, quality metrics, and best practice recommendations. A written personalized care plan for preventive services as well as general preventive health recommendations were provided to patient.   Ellouise VEAR Haws, LPN   [id number]/7974   After Visit Summary: (MyChart) Due to this being a telephonic visit, the after visit summary with patients personalized plan was offered to patient via MyChart   Notes: Nothing significant to report at this time.

## 2023-09-07 ENCOUNTER — Other Ambulatory Visit: Payer: Self-pay | Admitting: General Surgery

## 2023-09-07 DIAGNOSIS — R1032 Left lower quadrant pain: Secondary | ICD-10-CM

## 2023-09-14 ENCOUNTER — Ambulatory Visit
Admission: RE | Admit: 2023-09-14 | Discharge: 2023-09-14 | Disposition: A | Discharge status: Home / Self Care | Source: Ambulatory Visit | Attending: General Surgery | Admitting: General Surgery

## 2023-09-14 DIAGNOSIS — K5730 Diverticulosis of large intestine without perforation or abscess without bleeding: Secondary | ICD-10-CM | POA: Diagnosis not present

## 2023-09-14 DIAGNOSIS — R1032 Left lower quadrant pain: Secondary | ICD-10-CM

## 2023-10-02 ENCOUNTER — Ambulatory Visit: Payer: Self-pay | Admitting: General Surgery

## 2023-10-25 DIAGNOSIS — L821 Other seborrheic keratosis: Secondary | ICD-10-CM | POA: Diagnosis not present

## 2023-10-25 DIAGNOSIS — R202 Paresthesia of skin: Secondary | ICD-10-CM | POA: Diagnosis not present

## 2023-11-16 ENCOUNTER — Other Ambulatory Visit: Payer: Self-pay

## 2023-11-16 ENCOUNTER — Emergency Department (HOSPITAL_COMMUNITY)
Admission: EM | Admit: 2023-11-16 | Discharge: 2023-11-16 | Disposition: A | Discharge status: Home / Self Care | Attending: Emergency Medicine | Admitting: Emergency Medicine

## 2023-11-16 ENCOUNTER — Encounter (HOSPITAL_COMMUNITY): Payer: Self-pay | Admitting: *Deleted

## 2023-11-16 ENCOUNTER — Emergency Department (HOSPITAL_COMMUNITY)

## 2023-11-16 DIAGNOSIS — K5900 Constipation, unspecified: Secondary | ICD-10-CM | POA: Diagnosis not present

## 2023-11-16 DIAGNOSIS — R109 Unspecified abdominal pain: Secondary | ICD-10-CM | POA: Diagnosis present

## 2023-11-16 DIAGNOSIS — Z7901 Long term (current) use of anticoagulants: Secondary | ICD-10-CM | POA: Insufficient documentation

## 2023-11-16 DIAGNOSIS — R1084 Generalized abdominal pain: Secondary | ICD-10-CM | POA: Diagnosis not present

## 2023-11-16 DIAGNOSIS — R140 Abdominal distension (gaseous): Secondary | ICD-10-CM | POA: Diagnosis not present

## 2023-11-16 DIAGNOSIS — I4891 Unspecified atrial fibrillation: Secondary | ICD-10-CM | POA: Diagnosis not present

## 2023-11-16 DIAGNOSIS — K449 Diaphragmatic hernia without obstruction or gangrene: Secondary | ICD-10-CM | POA: Diagnosis not present

## 2023-11-16 DIAGNOSIS — Z79899 Other long term (current) drug therapy: Secondary | ICD-10-CM | POA: Diagnosis not present

## 2023-11-16 DIAGNOSIS — K5730 Diverticulosis of large intestine without perforation or abscess without bleeding: Secondary | ICD-10-CM | POA: Diagnosis not present

## 2023-11-16 DIAGNOSIS — I10 Essential (primary) hypertension: Secondary | ICD-10-CM | POA: Diagnosis not present

## 2023-11-16 DIAGNOSIS — R197 Diarrhea, unspecified: Secondary | ICD-10-CM | POA: Diagnosis not present

## 2023-11-16 DIAGNOSIS — E860 Dehydration: Secondary | ICD-10-CM | POA: Diagnosis not present

## 2023-11-16 DIAGNOSIS — N281 Cyst of kidney, acquired: Secondary | ICD-10-CM | POA: Diagnosis not present

## 2023-11-16 HISTORY — DX: Pure hypercholesterolemia, unspecified: E78.00

## 2023-11-16 LAB — CBC WITH DIFFERENTIAL/PLATELET
Abs Immature Granulocytes: 0.03 K/uL (ref 0.00–0.07)
Basophils Absolute: 0 K/uL (ref 0.0–0.1)
Basophils Relative: 0 %
Eosinophils Absolute: 0.1 K/uL (ref 0.0–0.5)
Eosinophils Relative: 1 %
HCT: 44.6 % (ref 39.0–52.0)
Hemoglobin: 14.8 g/dL (ref 13.0–17.0)
Immature Granulocytes: 0 %
Lymphocytes Relative: 7 %
Lymphs Abs: 0.8 K/uL (ref 0.7–4.0)
MCH: 30 pg (ref 26.0–34.0)
MCHC: 33.2 g/dL (ref 30.0–36.0)
MCV: 90.3 fL (ref 80.0–100.0)
Monocytes Absolute: 0.7 K/uL (ref 0.1–1.0)
Monocytes Relative: 7 %
Neutro Abs: 9.5 K/uL — ABNORMAL HIGH (ref 1.7–7.7)
Neutrophils Relative %: 85 %
Platelets: 200 K/uL (ref 150–400)
RBC: 4.94 MIL/uL (ref 4.22–5.81)
RDW: 13.9 % (ref 11.5–15.5)
WBC: 11.1 K/uL — ABNORMAL HIGH (ref 4.0–10.5)
nRBC: 0 % (ref 0.0–0.2)

## 2023-11-16 LAB — URINALYSIS, ROUTINE W REFLEX MICROSCOPIC
Bacteria, UA: NONE SEEN
Bilirubin Urine: NEGATIVE
Glucose, UA: NEGATIVE mg/dL
Ketones, ur: 20 mg/dL — AB
Leukocytes,Ua: NEGATIVE
Nitrite: NEGATIVE
Protein, ur: NEGATIVE mg/dL
Specific Gravity, Urine: 1.039 — ABNORMAL HIGH (ref 1.005–1.030)
pH: 5 (ref 5.0–8.0)

## 2023-11-16 LAB — COMPREHENSIVE METABOLIC PANEL WITH GFR
ALT: 40 U/L (ref 0–44)
AST: 35 U/L (ref 15–41)
Albumin: 4.6 g/dL (ref 3.5–5.0)
Alkaline Phosphatase: 53 U/L (ref 38–126)
Anion gap: 17 — ABNORMAL HIGH (ref 5–15)
BUN: 15 mg/dL (ref 8–23)
CO2: 21 mmol/L — ABNORMAL LOW (ref 22–32)
Calcium: 9.4 mg/dL (ref 8.9–10.3)
Chloride: 102 mmol/L (ref 98–111)
Creatinine, Ser: 1.07 mg/dL (ref 0.61–1.24)
GFR, Estimated: >60 mL/min (ref >60)
Glucose, Bld: 100 mg/dL — ABNORMAL HIGH (ref 70–99)
Potassium: 3.6 mmol/L (ref 3.5–5.1)
Sodium: 140 mmol/L (ref 135–145)
Total Bilirubin: 0.9 mg/dL (ref 0.0–1.2)
Total Protein: 7.2 g/dL (ref 6.5–8.1)

## 2023-11-16 LAB — LIPASE, BLOOD: Lipase: 38 U/L (ref 11–51)

## 2023-11-16 MED ORDER — SODIUM CHLORIDE 0.9 % IV BOLUS
1000.0000 mL | Freq: Once | INTRAVENOUS | Status: AC
  Administered 2023-11-16: 1000 mL via INTRAVENOUS

## 2023-11-16 MED ORDER — SMOG ENEMA
960.0000 mL | Freq: Once | RECTAL | Status: AC
  Administered 2023-11-16: 960 mL via RECTAL
  Filled 2023-11-16: qty 960

## 2023-11-16 MED ORDER — IOHEXOL 300 MG/ML  SOLN
100.0000 mL | Freq: Once | INTRAMUSCULAR | Status: AC | PRN
  Administered 2023-11-16: 100 mL via INTRAVENOUS

## 2023-11-16 NOTE — ED Provider Notes (Signed)
 [place] EMERGENCY DEPARTMENT AT [facility] Provider Note   CSN: [id number] Arrival date & time: 11/16/23  1316     Patient presents with: Abdominal Pain and Urinary Retention   Robert David is a 73 y.o. male.   Pt is a 73 yo male with pmhx significant for afib (on eliquis ), hld, and seasonal allergies.  Pt said he had diarrhea earlier in the week and now feels constipated.  He did not take anything for the diarrhea.  He did recently return from a trip to [place].   He also has not been able to urinate today.  He feels dehydrated.  He denies f/c.       Prior to Admission medications   Medication Sig Start Date End Date Taking? Authorizing Provider  acetaminophen  (TYLENOL ) 500 MG tablet Take 1,000 mg by mouth every 6 (six) hours as needed for fever or mild pain (pain score 1-3).   Yes [provider]  apixaban  (ELIQUIS ) 5 MG TABS tablet TAKE 1 TABLET(5 MG) BY MOUTH TWICE DAILY Patient taking differently: Take 5 mg by mouth 2 (two) times daily. 04/12/21  Yes Debera Jayson MATSU, MD  calcium  carbonate (TUMS - DOSED IN MG ELEMENTAL CALCIUM ) 500 MG chewable tablet Chew 1 tablet by mouth daily as needed for indigestion or heartburn.   Yes [provider]  cholecalciferol (VITAMIN D3) 25 MCG (1000 UNIT) tablet Take 1,000 Units by mouth daily.   Yes [provider]  diltiazem  (CARDIZEM  CD) 120 MG 24 hr capsule TAKE 1 CAPSULE(120 MG) BY MOUTH DAILY Patient taking differently: Take 120 mg by mouth daily. 03/27/23  Yes Katrinka Garnette KIDD, MD  NON FORMULARY Take 1 capsule by mouth in the morning and at bedtime. Cognium complete daily - Brain function   Yes [provider]  rosuvastatin  (CRESTOR ) 10 MG tablet Take 1 tablet (10 mg total) by mouth daily. Patient taking differently: Take 10 mg by mouth every evening. 02/09/23  Yes Katrinka Garnette KIDD, MD  tamsulosin  (FLOMAX ) 0.4 MG CAPS capsule TAKE 1 CAPSULE(0.4 MG) BY MOUTH DAILY Patient taking differently:  Take 0.4 mg by mouth daily after supper. 04/13/23  Yes Katrinka Garnette KIDD, MD    Allergies: Patient has no known allergies.    Review of Systems  Gastrointestinal:  Positive for abdominal pain and diarrhea.  Genitourinary:  Positive for difficulty urinating.  All other systems reviewed and are negative.   Updated Vital Signs BP (!) 168/93 (BP Location: Right Arm)   Pulse 85   Temp 97.8 F (36.6 C) (Oral)   Resp 16   Ht 5' 10 (1.778 m)   Wt 90.7 kg   SpO2 100%   BMI 28.70 kg/m   Physical Exam Vitals and nursing note reviewed.  Constitutional:      Appearance: He is well-developed.  HENT:     Head: Normocephalic and atraumatic.     Mouth/Throat:     Mouth: Mucous membranes are dry.     Pharynx: Oropharynx is clear.  Eyes:     Extraocular Movements: Extraocular movements intact.     Pupils: Pupils are equal, round, and reactive to light.  Cardiovascular:     Rate and Rhythm: Normal rate and regular rhythm.  Abdominal:     General: Abdomen is flat.     Palpations: Abdomen is soft.     Tenderness: There is abdominal tenderness in the suprapubic area.  Skin:    General: Skin is warm.     Capillary Refill:  Capillary refill takes less than 2 seconds.  Neurological:     General: No focal deficit present.     Mental Status: He is alert and oriented to person, place, and time.  Psychiatric:        Mood and Affect: Mood normal.        Behavior: Behavior normal.     (all labs ordered are listed, but only abnormal results are displayed) Labs Reviewed  CBC WITH DIFFERENTIAL/PLATELET - Abnormal; Notable for the following components:      Result Value   WBC 11.1 (*)    Neutro Abs 9.5 (*)    All other components within normal limits  COMPREHENSIVE METABOLIC PANEL WITH GFR - Abnormal; Notable for the following components:   CO2 21 (*)    Glucose, Bld 100 (*)    Anion gap 17 (*)    All other components within normal limits  LIPASE, BLOOD  URINALYSIS, ROUTINE W REFLEX  MICROSCOPIC    EKG: None  Radiology: CT ABDOMEN PELVIS W CONTRAST Result Date: 11/16/2023 CLINICAL DATA:  Diarrhea and constipation. EXAM: CT ABDOMEN AND PELVIS WITH CONTRAST TECHNIQUE: Multidetector CT imaging of the abdomen and pelvis was performed using the standard protocol following bolus administration of intravenous contrast. RADIATION DOSE REDUCTION: This exam was performed according to the departmental dose-optimization program which includes automated exposure control, adjustment of the mA and/or kV according to patient size and/or use of iterative reconstruction technique. CONTRAST:  OMNIPAQUE  IOHEXOL  300 MG/ML  SOLN COMPARISON:  September 14, 2023. FINDINGS: Lower chest: No acute abnormality. Hepatobiliary: No focal liver abnormality is seen. No gallstones, gallbladder wall thickening, or biliary dilatation. Pancreas: Unremarkable. No pancreatic ductal dilatation or surrounding inflammatory changes. Spleen: Normal in size without focal abnormality. Adrenals/Urinary Tract: Adrenal glands appear normal. Bilateral renal cysts are noted. No hydronephrosis or renal obstruction is noted. Mild urinary bladder distention is noted. Stomach/Bowel: Small sliding-type hiatal hernia. No evidence of bowel obstruction or inflammation. Large amount of stool is noted in the rectum concerning for impaction. Diverticulosis of descending and sigmoid colon is noted without inflammation. The appendix is not clearly visualized. Vascular/Lymphatic: Aortic atherosclerosis. No enlarged abdominal or pelvic lymph nodes. Reproductive: Prostate is unremarkable. Other: No abdominal wall hernia or abnormality. No abdominopelvic ascites. Musculoskeletal: No acute or significant osseous findings. IMPRESSION: 1. Small sliding-type hiatal hernia. 2. Diverticulosis of descending and sigmoid colon without inflammation. 3. Mild urinary bladder distention. 4. Large amount of stool seen in the rectum concerning for impaction. Aortic  Atherosclerosis (ICD10-I70.0). Electronically Signed   By: Lynwood Landy Raddle M.D.   On: 11/16/2023 15:53     Procedures   Medications Ordered in the ED  sorbitol, magnesium hydroxide, mineral oil, glycerin (SMOG) enema (has no administration in time range)  sodium chloride  0.9 % bolus 1,000 mL (1,000 mLs Intravenous New Bag/Given 11/16/23 1417)  iohexol  (OMNIPAQUE ) 300 MG/ML solution 100 mL (100 mLs Intravenous Contrast Given 11/16/23 1500)                                    Medical Decision Making Amount and/or Complexity of Data Reviewed Labs: ordered. Radiology: ordered.  Risk Prescription drug management.   This patient presents to the ED for concern of urinary retention, this involves an extensive number of treatment options, and is a complaint that carries with it a high risk of complications and morbidity.  The differential diagnosis includes retention, kidney stone,  uti, pyelo   Co morbidities that complicate the patient evaluation  afib (on eliquis ), hld, and seasonal allergies   Additional history obtained:  Additional history obtained from epic chart review External records from outside source obtained and reviewed including family   Lab Tests:  I Ordered, and personally interpreted labs.  The pertinent results include:  cbc nl, cm pnl   Imaging Studies ordered:  I ordered imaging studies including ct abd/pelvis   Small sliding-type hiatal hernia.  2. Diverticulosis of descending and sigmoid colon without  inflammation.  3. Mild urinary bladder distention.  4. Large amount of stool seen in the rectum concerning for  impaction.     Cardiac Monitoring:  The patient was maintained on a cardiac monitor.  I personally viewed and interpreted the cardiac monitored which showed an underlying rhythm of: nsr   Medicines ordered and prescription drug management:  I ordered medication including ivfs  for sx  Reevaluation of the patient after these medicines  showed that the patient improved I have reviewed the patients home medicines and have made adjustments as needed   Test Considered:  ct   Critical Interventions:  ct   Problem List / ED Course:  Constipation:  pt is having a large bm now.  He is given an additional enema to clear it all out.  Urinary retention likely due to constipation.   Reevaluation:  After the interventions noted above, I reevaluated the patient and found that they have :improved   Social Determinants of Health:  Lives at home   Dispostion:  After consideration of the diagnostic results and the patients response to treatment, I feel that the patent would benefit from discharge with outpatient f/u.       Final diagnoses:  Dehydration  Constipation, unspecified constipation type    ED Discharge Orders     None          Dean Clarity, MD 11/16/23 1605

## 2023-11-16 NOTE — ED Provider Notes (Signed)
 CT unremarkable except for constipation, patient disimpacted, has been given enema, stable for discharge   Cleotilde Rogue, MD 11/16/23 1818

## 2023-11-16 NOTE — Discharge Instructions (Addendum)
 I would strongly recommend that you start taking a stool softener such as Colace twice a day, you could also use MiraLAX, either of these would be a fine choice.  Follow-up with your doctor as needed.

## 2023-11-16 NOTE — ED Notes (Signed)
 Tech was able to scan pt bladder and saw urine.

## 2023-11-16 NOTE — ED Triage Notes (Signed)
 Pt BIB [facility] for lower abd pain, reported pt got back from [place] on Sunday, began to have diarrhea on Tuesday and Wednesday. Constipation this am, took a suppository.  Pt reports he has not voiding any today, dribbled some yesterday-known prostate issues.

## 2023-12-19 ENCOUNTER — Ambulatory Visit
Admission: RE | Admit: 2023-12-19 | Discharge: 2023-12-19 | Disposition: A | Discharge status: Home / Self Care | Attending: Family Medicine | Admitting: Family Medicine

## 2023-12-19 VITALS — BP 165/97 | HR 82 | Temp 98.1°F | Resp 18

## 2023-12-19 DIAGNOSIS — J069 Acute upper respiratory infection, unspecified: Secondary | ICD-10-CM

## 2023-12-19 NOTE — Discharge Instructions (Signed)
 You have a viral upper respiratory infection.  Symptoms should improve over the next few days.  If symptoms do not improve soon, please follow up with us  or your PCP.  If you develop chest pain or shortness of breath, go to the emergency room.  Some things that can make you feel better are: - Increased rest - Increasing fluid with water/sugar free electrolytes - Acetaminophen  and ibuprofen as needed for fever/pain - Salt water gargling, chloraseptic spray and throat lozenges for sore throat - Flonase  nasal spray to help with post nasal drainage - OTC guaifenesin (Mucinex) 600 mg twice daily - Saline sinus flushes or a neti pot - Humidifying the air

## 2023-12-19 NOTE — ED Provider Notes (Signed)
 [facility]    CSN: [id number] Arrival date & time: 12/19/23  9057      History   Chief Complaint Chief Complaint  Patient presents with   Nasal Congestion    Tired, cough, - Entered by patient    HPI Robert David is a 73 y.o. male.   Patient presents today with 6 day history of chills, congested cough at times, chest pain with coughing only, intermittent chest tightness, sore throat, postnasal drainage, something wrong with ears that is not a fullness or pain, and fatigue.  He denies fever, body aches, shortness of breath or wheezing, headache, abdominal pain, nausea/vomiting, diarrhea, and change in appetite.  Has been taking Mucinex, NyQuil extra-strength, Alka-Seltzer for symptoms without significant improvement.  Reports he had his flu shot in 12/15/2023, after symptoms began.  Reports the day symptoms began, he visited a patient at a local hospital and thinks he may have caught something there.  Patient knowledges elevated blood pressure today.  No vision changes, headache, chest pain, shortness of breath, lower extremity swelling, or lightheadedness/dizziness.  Reports he checks his blood pressure regularly at home is typically 140/80 or less.    Past Medical History:  Diagnosis Date   Headache(784.0)    High cholesterol    Hyperlipidemia    Persistent atrial fibrillation (HCC)    Seasonal allergies     Patient Active Problem List   Diagnosis Date Noted   Left groin hernia 08/29/2022   Aortic atherosclerosis 11/07/2019   Essential hypertension 03/25/2019   Vitamin D  deficiency 01/25/2018   BPH associated with nocturia 06/22/2017   Prepatellar bursitis, right knee 06/22/2017   Displaced bimalleolar fracture of right lower leg, subsequent encounter for closed fracture with delayed healing 02/12/2016   Hyperglycemia 09/01/2014   Contact dermatitis 09/01/2014   Insomnia 02/24/2014   Former smoker 02/24/2014   Chest pain 07/18/2012   GERD  (gastroesophageal reflux disease) 07/18/2012   Atrial fibrillation (HCC) 05/26/2009   Diastolic dysfunction 05/26/2009   Hyperlipidemia 05/11/2007    Past Surgical History:  Procedure Laterality Date   CARDIOVERSION N/A 05/12/2017   Procedure: CARDIOVERSION;  Surgeon: Okey Vina GAILS, MD;  Location: [facility] ENDOSCOPY;  Service: Cardiovascular;  Laterality: N/A;   COLONOSCOPY     FRACTURE SURGERY  Right ankle   LEFT HEART CATHETERIZATION WITH CORONARY ANGIOGRAM N/A 07/19/2012   Procedure: LEFT HEART CATHETERIZATION WITH CORONARY ANGIOGRAM;  Surgeon: Lynwood Schilling, MD;  Location: [facility] CATH LAB;  Service: Cardiovascular;  Laterality: N/A;   ORIF ANKLE FRACTURE Right 02/17/2016   Procedure: OPEN REDUCTION INTERNAL FIXATION (ORIF) BIMALLEOLAR ANKLE FRACTURE;  Surgeon: Kay CHRISTELLA Cummins, MD;  Location: [facility];  Service: Orthopedics;  Laterality: Right;   POLYPECTOMY         Home Medications    Prior to Admission medications   Medication Sig Start Date End Date Taking? Authorizing Provider  acetaminophen  (TYLENOL ) 500 MG tablet Take 1,000 mg by mouth every 6 (six) hours as needed for fever or mild pain (pain score 1-3).    [provider]  apixaban  (ELIQUIS ) 5 MG TABS tablet TAKE 1 TABLET(5 MG) BY MOUTH TWICE DAILY Patient taking differently: Take 5 mg by mouth 2 (two) times daily. 04/12/21   Debera Jayson MATSU, MD  calcium  carbonate (TUMS - DOSED IN MG ELEMENTAL CALCIUM ) 500 MG chewable tablet Chew 1 tablet by mouth daily as needed for indigestion or heartburn.    [provider]  cholecalciferol (VITAMIN D3) 25 MCG (1000 UNIT)  tablet Take 1,000 Units by mouth daily.    [provider]  diltiazem  (CARDIZEM  CD) 120 MG 24 hr capsule TAKE 1 CAPSULE(120 MG) BY MOUTH DAILY Patient taking differently: Take 120 mg by mouth daily. 03/27/23   Katrinka Garnette KIDD, MD  NON FORMULARY Take 1 capsule by mouth in the morning and at bedtime. Cognium complete daily - Brain  function    [provider]  rosuvastatin  (CRESTOR ) 10 MG tablet Take 1 tablet (10 mg total) by mouth daily. Patient taking differently: Take 10 mg by mouth every evening. 02/09/23   Katrinka Garnette KIDD, MD  tamsulosin  (FLOMAX ) 0.4 MG CAPS capsule TAKE 1 CAPSULE(0.4 MG) BY MOUTH DAILY Patient taking differently: Take 0.4 mg by mouth daily after supper. 04/13/23   Katrinka Garnette KIDD, MD    Family History Family History  Problem Relation Age of Onset   Cancer Mother    Breast cancer Mother    Heart disease Father        hx A-Fib, 73 with MI   Colon cancer Father 7       metastatic to lung   Rectal cancer Neg Hx    Stomach cancer Neg Hx     Social History Social History   Tobacco Use   Smoking status: Former    Current packs/day: 0.00    Average packs/day: 1 pack/day for 10.0 years (10.0 ttl pk-yrs)    Types: Cigarettes    Start date: 10/25/1978    Quit date: 10/24/1988    Years since quitting: 35.1   Smokeless tobacco: Never  Vaping Use   Vaping status: Never Used  Substance Use Topics   Alcohol use: No    Alcohol/week: 0.0 standard drinks of alcohol   Drug use: No     Allergies   Patient has no known allergies.   Review of Systems Review of Systems Per HPI  Physical Exam Triage Vital Signs ED Triage Vitals  Encounter Vitals Group     BP 12/19/23 0953 (!) 165/97     Girls Systolic BP Percentile --      Girls Diastolic BP Percentile --      Boys Systolic BP Percentile --      Boys Diastolic BP Percentile --      Pulse Rate 12/19/23 0953 82     Resp 12/19/23 0953 18     Temp 12/19/23 0953 98.1 F (36.7 C)     Temp Source 12/19/23 0953 Oral     SpO2 12/19/23 0953 97 %     Weight --      Height --      Head Circumference --      Peak Flow --      Pain Score 12/19/23 0956 0     Pain Loc --      Pain Education --      Exclude from Growth Chart --    No data found.  Updated Vital Signs BP (!) 165/97 (BP Location: Right Arm)   Pulse 82   Temp 98.1  F (36.7 C) (Oral)   Resp 18   SpO2 97%   Visual Acuity Right Eye Distance:   Left Eye Distance:   Bilateral Distance:    Right Eye Near:   Left Eye Near:    Bilateral Near:     Physical Exam Vitals and nursing note reviewed.  Constitutional:      General: He is not in acute distress.    Appearance: Normal appearance. He is not  ill-appearing or toxic-appearing.  HENT:     Head: Normocephalic and atraumatic.     Right Ear: Tympanic membrane, ear canal and external ear normal.     Left Ear: Tympanic membrane, ear canal and external ear normal.     Nose: No congestion or rhinorrhea.     Mouth/Throat:     Mouth: Mucous membranes are moist.     Pharynx: Oropharynx is clear. Postnasal drip present. No oropharyngeal exudate or posterior oropharyngeal erythema.  Eyes:     General: No scleral icterus.    Extraocular Movements: Extraocular movements intact.  Cardiovascular:     Rate and Rhythm: Normal rate. Rhythm irregular.  Pulmonary:     Effort: Pulmonary effort is normal. No respiratory distress.     Breath sounds: Normal breath sounds. No wheezing, rhonchi or rales.  Musculoskeletal:     Cervical back: Normal range of motion and neck supple.  Lymphadenopathy:     Cervical: No cervical adenopathy.  Skin:    General: Skin is warm and dry.     Coloration: Skin is not jaundiced or pale.     Findings: No erythema or rash.  Neurological:     Mental Status: He is alert and oriented to person, place, and time.  Psychiatric:        Behavior: Behavior is cooperative.      UC Treatments / Results  Labs (all labs ordered are listed, but only abnormal results are displayed) Labs Reviewed - No data to display  EKG   Radiology No results found.  Procedures Procedures (including critical care time)  Medications Ordered in UC Medications - No data to display  Initial Impression / Assessment and Plan / UC Course  I have reviewed the triage vital signs and the nursing  notes.  Pertinent labs & imaging results that were available during my care of the patient were reviewed by me and considered in my medical decision making (see chart for details).   Patient is mildly hypertensive in triage today, otherwise vital signs are stable.  Hypertension likely due to over-the-counter cold medicine.  1. Viral URI with cough Discussed viral etiology likely Vitals and exam are reassuring Viral testing deferred by patient Supportive care discussed, patient declines cough suppressant medication Recommended avoidance of over-the-counter cough and cold medications other than Coricidin as this is likely causing elevated blood pressure today Return and ER precautions discussed  The patient was given the opportunity to ask questions.  All questions answered to their satisfaction.  The patient is in agreement to this plan.   Final Clinical Impressions(s) / UC Diagnoses   Final diagnoses:  Viral URI with cough     Discharge Instructions      You have a viral upper respiratory infection.  Symptoms should improve over the next few days.  If symptoms do not improve soon, please follow up with us  or your PCP.  If you develop chest pain or shortness of breath, go to the emergency room.  Some things that can make you feel better are: - Increased rest - Increasing fluid with water/sugar free electrolytes - Acetaminophen  and ibuprofen as needed for fever/pain - Salt water gargling, chloraseptic spray and throat lozenges for sore throat - Flonase  nasal spray to help with post nasal drainage - OTC guaifenesin (Mucinex) 600 mg twice daily - Saline sinus flushes or a neti pot - Humidifying the air     ED Prescriptions   None    PDMP not reviewed this encounter.  Chandra Harlene LABOR, NP 12/19/23 [phone]

## 2023-12-19 NOTE — ED Triage Notes (Signed)
 Pt reports he has nasal congestion, throat tightness, and cough x 6 days    Recently got the flu shot x 4 days  Took nyquil

## 2024-01-18 ENCOUNTER — Other Ambulatory Visit: Payer: Self-pay | Admitting: Family Medicine

## 2024-01-29 ENCOUNTER — Encounter: Payer: Self-pay | Admitting: Family Medicine

## 2024-01-29 ENCOUNTER — Encounter: Payer: Medicare HMO | Admitting: Family Medicine

## 2024-01-29 VITALS — BP 114/64 | HR 65 | Temp 98.4°F | Ht 70.0 in | Wt 208.0 lb

## 2024-01-29 DIAGNOSIS — Z131 Encounter for screening for diabetes mellitus: Secondary | ICD-10-CM

## 2024-01-29 DIAGNOSIS — E559 Vitamin D deficiency, unspecified: Secondary | ICD-10-CM

## 2024-01-29 DIAGNOSIS — R739 Hyperglycemia, unspecified: Secondary | ICD-10-CM

## 2024-01-29 DIAGNOSIS — I10 Essential (primary) hypertension: Secondary | ICD-10-CM

## 2024-01-29 DIAGNOSIS — Z125 Encounter for screening for malignant neoplasm of prostate: Secondary | ICD-10-CM | POA: Diagnosis not present

## 2024-01-29 DIAGNOSIS — Z0000 Encounter for general adult medical examination without abnormal findings: Secondary | ICD-10-CM

## 2024-01-29 DIAGNOSIS — E785 Hyperlipidemia, unspecified: Secondary | ICD-10-CM | POA: Diagnosis not present

## 2024-01-29 LAB — MICROALBUMIN / CREATININE URINE RATIO
Creatinine,U: 127.5 mg/dL
Microalb Creat Ratio: 8.3 mg/g (ref 0.0–30.0)
Microalb, Ur: 1.1 mg/dL (ref 0.7–1.9)

## 2024-01-29 LAB — CBC WITH DIFFERENTIAL/PLATELET
Basophils Absolute: 0 K/uL (ref 0.0–0.1)
Basophils Relative: 0.8 % (ref 0.0–3.0)
Eosinophils Absolute: 0.2 K/uL (ref 0.0–0.7)
Eosinophils Relative: 4.8 % (ref 0.0–5.0)
HCT: 43.3 % (ref 39.0–52.0)
Hemoglobin: 14.4 g/dL (ref 13.0–17.0)
Lymphocytes Relative: 31.8 % (ref 12.0–46.0)
Lymphs Abs: 1.6 K/uL (ref 0.7–4.0)
MCHC: 33.2 g/dL (ref 30.0–36.0)
MCV: 89.1 fl (ref 78.0–100.0)
Monocytes Absolute: 0.5 K/uL (ref 0.1–1.0)
Monocytes Relative: 10.1 % (ref 3.0–12.0)
Neutro Abs: 2.7 K/uL (ref 1.4–7.7)
Neutrophils Relative %: 52.5 % (ref 43.0–77.0)
Platelets: 229 K/uL (ref 150.0–400.0)
RBC: 4.86 Mil/uL (ref 4.22–5.81)
RDW: 14.2 % (ref 11.5–15.5)
WBC: 5.1 K/uL (ref 4.0–10.5)

## 2024-01-29 LAB — HEMOGLOBIN A1C: Hgb A1c MFr Bld: 6 % (ref 4.6–6.5)

## 2024-01-29 LAB — URINALYSIS, ROUTINE W REFLEX MICROSCOPIC
Bilirubin Urine: NEGATIVE
Ketones, ur: NEGATIVE
Leukocytes,Ua: NEGATIVE
Nitrite: NEGATIVE
Specific Gravity, Urine: 1.02 (ref 1.000–1.030)
Total Protein, Urine: NEGATIVE
Urine Glucose: NEGATIVE
Urobilinogen, UA: 0.2 (ref 0.0–1.0)
pH: 6 (ref 5.0–8.0)

## 2024-01-29 LAB — LIPID PANEL
Cholesterol: 107 mg/dL (ref 28–200)
HDL: 49.5 mg/dL
LDL Cholesterol: 47 mg/dL (ref 10–99)
NonHDL: 57.37
Total CHOL/HDL Ratio: 2
Triglycerides: 51 mg/dL (ref 10.0–149.0)
VLDL: 10.2 mg/dL (ref 0.0–40.0)

## 2024-01-29 LAB — COMPREHENSIVE METABOLIC PANEL WITH GFR
ALT: 29 U/L (ref 3–53)
AST: 27 U/L (ref 5–37)
Albumin: 4.8 g/dL (ref 3.5–5.2)
Alkaline Phosphatase: 42 U/L (ref 39–117)
BUN: 16 mg/dL (ref 6–23)
CO2: 27 meq/L (ref 19–32)
Calcium: 9.7 mg/dL (ref 8.4–10.5)
Chloride: 106 meq/L (ref 96–112)
Creatinine, Ser: 1.13 mg/dL (ref 0.40–1.50)
GFR: 63.75 mL/min
Glucose, Bld: 106 mg/dL — ABNORMAL HIGH (ref 70–99)
Potassium: 4 meq/L (ref 3.5–5.1)
Sodium: 142 meq/L (ref 135–145)
Total Bilirubin: 0.6 mg/dL (ref 0.2–1.2)
Total Protein: 7 g/dL (ref 6.0–8.3)

## 2024-01-29 LAB — PSA, MEDICARE: PSA: 1.61 ng/mL (ref 0.10–4.00)

## 2024-01-29 LAB — VITAMIN D 25 HYDROXY (VIT D DEFICIENCY, FRACTURES): VITD: 19.95 ng/mL — ABNORMAL LOW (ref 30.00–100.00)

## 2024-01-29 NOTE — Progress Notes (Signed)
 " Phone: [phone]   Subjective:  Patient presents today for their annual physical. Chief complaint-noted.   See problem oriented charting- ROS- full  review of systems was completed and negative  except for topics noted under acute/chronic concerns  The following were reviewed and entered/updated in epic: Past Medical History:  Diagnosis Date   Headache(784.0)    High cholesterol    Hyperlipidemia    Persistent atrial fibrillation (HCC)    Seasonal allergies    Patient Active Problem List   Diagnosis Date Noted   Atrial fibrillation (HCC) 05/26/2009    Priority: High   Aortic atherosclerosis 11/07/2019    Priority: Medium    Essential hypertension 03/25/2019    Priority: Medium    Vitamin D  deficiency 01/25/2018    Priority: Medium    BPH associated with nocturia 06/22/2017    Priority: Medium    Hyperglycemia 09/01/2014    Priority: Medium    Insomnia 02/24/2014    Priority: Medium    Diastolic dysfunction 05/26/2009    Priority: Medium    Hyperlipidemia 05/11/2007    Priority: Medium    Contact dermatitis 09/01/2014    Priority: Low   Former smoker 02/24/2014    Priority: Low   Chest pain 07/18/2012    Priority: Low   GERD (gastroesophageal reflux disease) 07/18/2012    Priority: Low   Prepatellar bursitis, right knee 06/22/2017    Priority: 1.   Displaced bimalleolar fracture of right lower leg, subsequent encounter for closed fracture with delayed healing 02/12/2016    Priority: 1.   Left groin hernia 08/29/2022   Past Surgical History:  Procedure Laterality Date   CARDIOVERSION N/A 05/12/2017   Procedure: CARDIOVERSION;  Surgeon: Okey Vina GAILS, MD;  Location: [facility] ENDOSCOPY;  Service: Cardiovascular;  Laterality: N/A;   COLONOSCOPY     FRACTURE SURGERY  Right ankle   LEFT HEART CATHETERIZATION WITH CORONARY ANGIOGRAM N/A 07/19/2012   Procedure: LEFT HEART CATHETERIZATION WITH CORONARY ANGIOGRAM;  Surgeon: Lynwood Schilling, MD;  Location: [facility] CATH LAB;   Service: Cardiovascular;  Laterality: N/A;   ORIF ANKLE FRACTURE Right 02/17/2016   Procedure: OPEN REDUCTION INTERNAL FIXATION (ORIF) BIMALLEOLAR ANKLE FRACTURE;  Surgeon: Kay CHRISTELLA Cummins, MD;  Location:  SURGERY CENTER;  Service: Orthopedics;  Laterality: Right;   POLYPECTOMY      Family History  Problem Relation Age of Onset   Cancer Mother    Breast cancer Mother    Heart disease Father        hx A-Fib, 43 with MI   Colon cancer Father 68       metastatic to lung   Rectal cancer Neg Hx    Stomach cancer Neg Hx     Medications- reviewed and updated Current Outpatient Medications  Medication Sig Dispense Refill   acetaminophen  (TYLENOL ) 500 MG tablet Take 1,000 mg by mouth every 6 (six) hours as needed for fever or mild pain (pain score 1-3).     apixaban  (ELIQUIS ) 5 MG TABS tablet TAKE 1 TABLET(5 MG) BY MOUTH TWICE DAILY (Patient taking differently: Take 5 mg by mouth 2 (two) times daily.) 60 tablet 5   calcium  carbonate (TUMS - DOSED IN MG ELEMENTAL CALCIUM ) 500 MG chewable tablet Chew 1 tablet by mouth daily as needed for indigestion or heartburn.     cholecalciferol (VITAMIN D3) 25 MCG (1000 UNIT) tablet Take 1,000 Units by mouth daily.     clobetasol  cream (TEMOVATE ) 0.05 % Apply topically daily as needed.  diltiazem  (CARDIZEM  CD) 120 MG 24 hr capsule TAKE 1 CAPSULE(120 MG) BY MOUTH DAILY (Patient taking differently: Take 120 mg by mouth daily.) 90 capsule 3   NON FORMULARY Take 1 capsule by mouth in the morning and at bedtime. Cognium complete daily - Brain function     rosuvastatin  (CRESTOR ) 10 MG tablet Take 1 tablet (10 mg total) by mouth daily. 90 tablet 3   tamsulosin  (FLOMAX ) 0.4 MG CAPS capsule Take 0.8 mg by mouth daily after supper.     No current facility-administered medications for this visit.    Allergies-reviewed and updated Allergies[1]  Social History   Social History Narrative   Widower (June 2024wife Alisa (bobby gail)was also Dr. Katrinka  patient) [phone]. 3 step children (2 living). 3 stepgrandchildren.       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       Hobbies: bowling, golf, exercise    Objective  Objective:  BP 114/64 Comment: most recent evening reading  Pulse 65   Temp 98.4 F (36.9 C) (Temporal)   Ht 5' 10 (1.778 m)   Wt 208 lb (94.3 kg)   SpO2 97%   BMI 29.84 kg/m  Gen: NAD, resting comfortably HEENT: Mucous membranes are moist. Oropharynx normal Neck: no thyromegaly CV: RRR no murmurs rubs or gallops Lungs: CTAB no crackles, wheeze, rhonchi Abdomen: soft/nontender/nondistended/normal bowel sounds. No rebound or guarding.  Ext: no edema Skin: warm, dry Neuro: grossly normal, moves all extremities, PERRLA    Assessment and Plan  Robert David presenting for annual physical.  Health Maintenance counseling: 1. Anticipatory guidance: Patient counseled regarding regular dental exams - needs new dentist but generally q6 months, eye exams - yearly,  avoiding smoking and second hand smoke , limiting alcohol to 2 beverages per day - none, no illicit drugs .   2. Risk factor reduction:  Advised patient of need for regular exercise and diet rich and fruits and vegetables to reduce risk of heart attack and stroke.  Exercise- plans to start back at [facility] in january.  Diet/weight management-diet has changed eating for one and has been very challenging up 19 lbs and he wants to reverse this trend and plans to  [facility] Readings from Last 3 Encounters:  01/29/24 208 lb (94.3 kg)  11/16/23 200 lb (90.7 kg)  08/28/23 205 lb 12.8 oz (93.4 kg)  3. Immunizations/screenings/ancillary studies- declines COVID, shingrix (for now- may do after holidays), Prevnar 20 could opt in Immunization History  Administered Date(s) Administered   INFLUENZA, HIGH DOSE SEASONAL PF 01/09/2015, 03/14/2016, 11/14/2017, 10/08/2018, 12/21/2020, 11/21/2021, 12/14/2023   Influenza Split 10/20/2011   Influenza Whole 11/26/2008    Influenza,inj,Quad PF,6+ Mos 01/21/2013   Influenza-Unspecified 01/21/2014, 01/05/2017, 11/14/2017, 12/30/2019   Moderna SARS-COV2 Booster Vaccination 02/13/2020, 06/29/2020   Moderna Sars-Covid-2 Vaccination 05/01/2019, 05/29/2019   Pneumococcal Conjugate-13 03/09/2015   Pneumococcal-Unspecified 01/21/2014   Respiratory Syncytial Virus Vaccine,Recomb Aduvanted(Arexvy) 02/25/2022   Td 06/04/2008   Tdap 01/10/2019   Zoster, Live 10/11/2011   4. Prostate cancer screening- bump last year but #s came back down- update today  Lab Results  Component Value Date   PSA 1.39 03/06/2023   PSA 2.14 01/23/2023   PSA 1.39 01/19/2022   5. Colon cancer screening - Dr. Stacia 11/11/21- 3 polyps precancerous but no high risk features- Given your history of small and low risk polyps, I think your risk of colon cancer is very low and would recommend against further colon cancer screening.  6. Skin cancer screening- yearly dermatology Dr. Eda in [place]. advised regular sunscreen use. Denies worrisome, changing, or new skin lesions.  7. Smoking associated screening (lung cancer screening, AAA screen 63-Robert, UA)- Former smoker-quit smoking 30 years ago. Have discussed AAA screening in past but he now had CT abd/pelvis on 11/06/19 with no aneurysm  8. STD screening - not dating  Status of chronic or acute concerns   #social update- went to [place] within the year.   # Fecal impaction October 2025 required smog enema.  Had short-term urinary retention likely related to constipation.   # Permanent atrial fibrillation-left atrium dilation S: Rate controlled with diltiazem  120 mg ER Anticoagulated with Eliquis  5 mg twice daily (through [place]) Chadsvasc score of 2 in 2020 Patient is  followed by cardiology: Dr. Debera A/P: appropriately anticoagulated and rate controlled- continue current medicine     #hypertension S: medication: Has been controlled without medication in past Home readings #s: can run  140's in morning but most recently in evening 114/64 BP Readings from Last 3 Encounters:  01/29/24 114/64  12/19/23 (!) 165/97  11/16/23 (!) 179/84  A/P: blood pressure running higher in office and in mornings at home some but evening readings still look excellent- he wants to work on lifestyle/stress/weight loss to try to help first.    #hyperlipidemia with aortic atherosclerosis S: Medication:rosuvastatin  10 mg daily -CT calcium  scoring of 446 which is 66 percentile in January 2024  Lab Results  Component Value Date   CHOL 123 01/23/2023   HDL 53.70 01/23/2023   LDLCALC 58 01/23/2023   LDLDIRECT 43.0 03/30/2022   TRIG 56.0 01/23/2023   CHOLHDL 2 01/23/2023  A/P: reasonable control last year but he's concerned could be up- could bump rosuvastatin  but we also discussed statins do slightly increase diabetes risk    # Hyperglycemia/insulin resistance/prediabetes-A1c as high as 5.9 in 2021 S:  Medication: None Exercise and diet- typically exercises at least 2 days a week but down lately- wants of the restart Lab Results  Component Value Date   HGBA1C 6.0 01/23/2023   HGBA1C 5.9 01/19/2022   HGBA1C 5.8 01/18/2021  A/P: he's worried may be worse- update level   #Vitamin D  deficiency S: Medication: vitamin D  2000 units Last vitamin D  Lab Results  Component Value Date   VD25OH 33.22 01/23/2023  A/P: hopefully stable- update a1c today. Continue current meds for now    #BPH- on flomax  0.8 mg helpful- gets through [place]   #inguinal hernia left- below belt line and offered referral 08/29/22 but later seen by Dr. Curvin who did CT of the pelvis and showed no hernia 09/14/2023   Recommended follow up: Return in about 6 months (around 07/29/2024) for followup or sooner if needed.Schedule b4 you leave. Future Appointments  Date Time Provider Department Center  09/02/2024  8:40 AM [facility] ANNUAL WELLNESS VISIT 1 [facility]   Lab/Order associations: fasting   ICD-10-CM   1.  Preventative health care  Z00.00     2. Screening for prostate cancer  Z12.5     3. Vitamin D  deficiency  E55.9     4. Hyperlipidemia, unspecified hyperlipidemia type  E78.5     5. Hyperglycemia  R73.9     6. Screening for diabetes mellitus  Z13.1       No orders of the defined types were placed in this encounter.   Return precautions advised.  Garnette Lukes, MD      [1] No Known Allergies  "

## 2024-01-29 NOTE — Patient Instructions (Addendum)
 If blood pressure not trending down we can sit back down to discuss options sooner than 6 months even- bring a physical log next time  Please stop by lab before you go If you have mychart- we will send your results within 3 business days of us  receiving them.  If you do not have mychart- we will call you about results within 5 business days of us  receiving them.  *please also note that you will see labs on mychart as soon as they post. I will later go in and write notes on them- will say notes from Dr. Katrinka   Recommended follow up: Return in about 6 months (around 07/29/2024) for followup or sooner if needed.Schedule b4 you leave.

## 2024-01-30 ENCOUNTER — Ambulatory Visit: Payer: Self-pay | Admitting: Family Medicine

## 2024-01-30 MED ORDER — VITAMIN D (ERGOCALCIFEROL) 1.25 MG (50000 UNIT) PO CAPS: 50000.0000 [IU] | ORAL_CAPSULE | ORAL | 1 refills | Status: AC

## 2024-01-31 NOTE — Progress Notes (Signed)
 Patient viewed result message via MyChart.  Last read by Robert David at 2:23PM on 01/30/2024.

## 2024-02-16 ENCOUNTER — Encounter: Payer: Self-pay | Admitting: Family Medicine

## 2024-02-19 NOTE — Telephone Encounter (Signed)
 LVM to schedule ov with Katrinka

## 2024-02-28 ENCOUNTER — Ambulatory Visit: Admitting: Family Medicine

## 2024-02-29 ENCOUNTER — Other Ambulatory Visit: Payer: Self-pay | Admitting: Family Medicine

## 2024-03-08 ENCOUNTER — Ambulatory Visit (INDEPENDENT_AMBULATORY_CARE_PROVIDER_SITE_OTHER): Admitting: Family Medicine

## 2024-03-08 ENCOUNTER — Ambulatory Visit: Payer: Self-pay | Admitting: Family Medicine

## 2024-03-08 ENCOUNTER — Encounter: Payer: Self-pay | Admitting: Family Medicine

## 2024-03-08 VITALS — BP 138/78 | HR 70 | Temp 97.7°F | Ht 70.0 in | Wt 204.2 lb

## 2024-03-08 DIAGNOSIS — N529 Male erectile dysfunction, unspecified: Secondary | ICD-10-CM

## 2024-03-08 DIAGNOSIS — R6882 Decreased libido: Secondary | ICD-10-CM | POA: Diagnosis not present

## 2024-03-08 DIAGNOSIS — E291 Testicular hypofunction: Secondary | ICD-10-CM

## 2024-03-08 DIAGNOSIS — I4821 Permanent atrial fibrillation: Secondary | ICD-10-CM

## 2024-03-08 DIAGNOSIS — I10 Essential (primary) hypertension: Secondary | ICD-10-CM

## 2024-03-08 LAB — TESTOSTERONE: Testosterone: 294.26 ng/dL — ABNORMAL LOW (ref 300.00–890.00)

## 2024-03-08 NOTE — Progress Notes (Signed)
 " Phone [phone] In person visit   Subjective:   Robert David is a 74 y.o. year old very pleasant male patient who presents for/with See problem oriented charting Chief Complaint  Patient presents with   Medical Management of Chronic Issues    Would like to discuss testosterone and viagra;     Past Medical History-  Patient Active Problem List   Diagnosis Date Noted   Atrial fibrillation (HCC) 05/26/2009    Priority: High   Aortic atherosclerosis 11/07/2019    Priority: Medium    Essential hypertension 03/25/2019    Priority: Medium    Vitamin D  deficiency 01/25/2018    Priority: Medium    BPH associated with nocturia 06/22/2017    Priority: Medium    Hyperglycemia 09/01/2014    Priority: Medium    Insomnia 02/24/2014    Priority: Medium    Diastolic dysfunction 05/26/2009    Priority: Medium    Hyperlipidemia 05/11/2007    Priority: Medium    Contact dermatitis 09/01/2014    Priority: Low   Former smoker 02/24/2014    Priority: Low   Chest pain 07/18/2012    Priority: Low   GERD (gastroesophageal reflux disease) 07/18/2012    Priority: Low   Prepatellar bursitis, right knee 06/22/2017    Priority: 1.   Displaced bimalleolar fracture of right lower leg, subsequent encounter for closed fracture with delayed healing 02/12/2016    Priority: 1.   Left groin hernia 08/29/2022    Medications- reviewed and updated Current Outpatient Medications  Medication Sig Dispense Refill   acetaminophen  (TYLENOL ) 500 MG tablet Take 1,000 mg by mouth every 6 (six) hours as needed for fever or mild pain (pain score 1-3).     apixaban  (ELIQUIS ) 5 MG TABS tablet TAKE 1 TABLET(5 MG) BY MOUTH TWICE DAILY (Patient taking differently: Take 5 mg by mouth 2 (two) times daily.) 60 tablet 5   cholecalciferol (VITAMIN D3) 25 MCG (1000 UNIT) tablet Take 1,000 Units by mouth daily.     clobetasol  cream (TEMOVATE ) 0.05 % Apply topically daily as needed.     diltiazem  (CARDIZEM  CD) 120 MG 24  hr capsule Take 1 capsule (120 mg total) by mouth daily. 90 capsule 3   NON FORMULARY Take 1 capsule by mouth in the morning and at bedtime. Cognium complete daily - Brain function     rosuvastatin  (CRESTOR ) 10 MG tablet Take 1 tablet (10 mg total) by mouth daily. 90 tablet 3   sildenafil (VIAGRA) 100 MG tablet Take 100 mg by mouth daily as needed for erectile dysfunction (takes half. through mail order. Dr. Katrinka willing to rx).     tamsulosin  (FLOMAX ) 0.4 MG CAPS capsule Take 0.8 mg by mouth daily after supper.     Vitamin D , Ergocalciferol , (DRISDOL ) 1.25 MG (50000 UNIT) CAPS capsule Take 1 capsule (50,000 Units total) by mouth every 7 (seven) days. 13 capsule 1   No current facility-administered medications for this visit.     Objective:  BP 138/78 (BP Location: Left Arm, Patient Position: Sitting, Cuff Size: Normal)   Pulse 70   Temp 97.7 F (36.5 C) (Temporal)   Ht 5' 10 (1.778 m)   Wt 204 lb 3.2 oz (92.6 kg)   SpO2 98%   BMI 29.30 kg/m  Gen: NAD, resting comfortably CV: RRR no murmurs rubs or gallops Lungs: CTAB no crackles, wheeze, rhonchi Ext: no edema Skin: warm, dry     Assessment and Plan    # concern for  low testosterone/ erectile dysfunction (ED)  S: patient messaged me the following earlier this month Dr. Katrinka, a lot has changed since I last saw you in December. I'm starting to date again with a woman that God has placed in my life. And I just have a couple of questions, I've ordered some Viagra for future use. Is it safe for me to take this. Also, how do I go about getting my testosterone checked? I know it's low and I just don't know what to do about it. So I'm asking you and your suggestions.     Today he reports erectile issues. He's purchased Viagra -did try it and noted some success. -libido has dropped with time -notes ok getting erections but trouble to maintain . Has been able to ejaculate at time but others not successful -on the days he takes this  he does not take the Flomax . He's checked blood pressure after trialing and in general has been ok but did have one at 98 systolic- felt a little run down but not lightheaded  A/P: Patient with erectile dysfunction-difficulty maintaining erection as well as ejaculating along with some low libido - He would like to check testosterone levels and I think it is very reasonable.  He would prefer to try testosterone and off of sildenafil if possible.  I am okay with him adding sildenafil and the fact that he is holding the tamsulosin  I think is helpful  - diltiazem  may have some erectile dysfunction (ED)  effect but important for him -tamsulosin  can cause retrograde ejaculation and decreased volume of ejaculate or finishing ejaculation- some suggestions suggest worsening erectile dysfunction (ED)  issues. Some studies report potential benefit -also discussed tamsulosin  with Viagra can cause unsafe drop in blood pressure   -mentioned option of daily tadalafil to help with both options- he's doing well with his current regimen so will continue  # Permanent atrial fibrillation-left atrium dilation S: Rate controlled with diltiazem  120 mg ER Anticoagulated with Eliquis  5 mg twice daily (through [place]) Patient is  followed by cardiology: Dr. Debera A/P: appropriately anticoagulated and rate controlled- continue current medicine  -thankfully he has continued diltiazem  even on days of sildenafil- I encouraged him to continue that as want to avoid RVR   #hypertension S: medication: Has been controlled without medication BP Readings from Last 3 Encounters:  03/08/24 138/78  01/29/24 114/64  12/19/23 (!) 165/97  A/P: High acceptable today but shows me some home readings which look well-controlled-actually seeing some drop after sildenafil but tolerable   Recommended follow up: Return for next already scheduled visit or sooner if needed. Future Appointments  Date Time Provider Department Center  04/02/2024   8:40 AM Debera Jayson MATSU, MD [facility]  07/29/2024  9:00 AM Katrinka Garnette KIDD, MD [facility]  09/02/2024  8:40 AM [facility] VISIT 1 [facility]  01/29/2025  9:00 AM Katrinka Garnette KIDD, MD [facility]    Lab/Order associations: fasting   ICD-10-CM   1. Low libido  R68.82 Testosterone    2. Erectile dysfunction, unspecified erectile dysfunction type  N52.9 Testosterone    3. Permanent atrial fibrillation (HCC)  I48.21     4. Essential hypertension  I10       No orders of the defined types were placed in this encounter.   Return precautions advised.  Garnette Katrinka, MD  "

## 2024-03-08 NOTE — Patient Instructions (Addendum)
 Please stop by lab before you go If you have mychart- we will send your results within 3 business days of us  receiving them.  If you do not have mychart- we will call you about results within 5 business days of us  receiving them.  *please also note that you will see labs on mychart as soon as they post. I will later go in and write notes on them- will say notes from Dr. Katrinka   If you want me to prescribe sildenafil in the future- just shoot me a message and I can send in for you under my name- reference this visit in particular  Recommended follow up: Return for next already scheduled visit or sooner if needed.

## 2024-03-11 ENCOUNTER — Ambulatory Visit: Admitting: Family Medicine

## 2024-03-11 NOTE — Telephone Encounter (Signed)
 Pt has been scheduled 03/25/24 for 8:45 am lab only appt. Pt informed can come in earlier than 8:45. Pt verbalized understanding.

## 2024-03-19 ENCOUNTER — Ambulatory Visit: Admitting: Family Medicine

## 2024-03-25 ENCOUNTER — Other Ambulatory Visit

## 2024-04-02 ENCOUNTER — Ambulatory Visit: Admitting: Cardiology

## 2024-07-29 ENCOUNTER — Ambulatory Visit: Admitting: Family Medicine

## 2024-09-02 ENCOUNTER — Ambulatory Visit

## 2025-01-29 ENCOUNTER — Encounter: Admitting: Family Medicine
